# Patient Record
Sex: Female | Born: 1945 | Race: White | Hispanic: No | Smoking: Never smoker
Health system: Southern US, Community
[De-identification: ages and names within clinical notes are randomized; demographics above are authoritative.]

## PROBLEM LIST (undated history)

## (undated) DIAGNOSIS — D649 Anemia, unspecified: Secondary | ICD-10-CM

## (undated) DIAGNOSIS — C801 Malignant (primary) neoplasm, unspecified: Secondary | ICD-10-CM

## (undated) DIAGNOSIS — K449 Diaphragmatic hernia without obstruction or gangrene: Secondary | ICD-10-CM

## (undated) DIAGNOSIS — I10 Essential (primary) hypertension: Secondary | ICD-10-CM

## (undated) DIAGNOSIS — E079 Disorder of thyroid, unspecified: Secondary | ICD-10-CM

## (undated) DIAGNOSIS — J45909 Unspecified asthma, uncomplicated: Secondary | ICD-10-CM

## (undated) DIAGNOSIS — N289 Disorder of kidney and ureter, unspecified: Secondary | ICD-10-CM

## (undated) HISTORY — DX: Unspecified asthma, uncomplicated: J45.909

## (undated) HISTORY — DX: Anemia, unspecified: D64.9

## (undated) HISTORY — PX: TONSILLECTOMY: SUR1361

## (undated) HISTORY — PX: ABDOMINAL HYSTERECTOMY: SHX81

## (undated) HISTORY — PX: THYROIDECTOMY, PARTIAL: SHX18

## (undated) HISTORY — PX: COLOSTOMY: SHX63

---

## 1999-05-18 ENCOUNTER — Other Ambulatory Visit: Admission: RE | Admit: 1999-05-18 | Discharge: 1999-05-18 | Payer: Self-pay | Admitting: *Deleted

## 1999-05-23 ENCOUNTER — Emergency Department (HOSPITAL_COMMUNITY): Admission: EM | Admit: 1999-05-23 | Discharge: 1999-05-23 | Payer: Self-pay | Admitting: Emergency Medicine

## 1999-05-24 ENCOUNTER — Emergency Department (HOSPITAL_COMMUNITY): Admission: EM | Admit: 1999-05-24 | Discharge: 1999-05-24 | Payer: Self-pay | Admitting: Emergency Medicine

## 1999-05-24 ENCOUNTER — Encounter: Payer: Self-pay | Admitting: Endocrinology

## 1999-05-24 ENCOUNTER — Ambulatory Visit (HOSPITAL_COMMUNITY): Admission: RE | Admit: 1999-05-24 | Discharge: 1999-05-24 | Payer: Self-pay | Admitting: Endocrinology

## 1999-06-20 ENCOUNTER — Ambulatory Visit (HOSPITAL_COMMUNITY): Admission: RE | Admit: 1999-06-20 | Discharge: 1999-06-20 | Payer: Self-pay | Admitting: Endocrinology

## 1999-06-20 ENCOUNTER — Encounter: Payer: Self-pay | Admitting: Endocrinology

## 1999-07-18 ENCOUNTER — Other Ambulatory Visit: Admission: RE | Admit: 1999-07-18 | Discharge: 1999-07-18 | Payer: Self-pay | Admitting: Endocrinology

## 1999-07-27 ENCOUNTER — Encounter: Payer: Self-pay | Admitting: *Deleted

## 1999-07-27 ENCOUNTER — Ambulatory Visit (HOSPITAL_COMMUNITY): Admission: RE | Admit: 1999-07-27 | Discharge: 1999-07-27 | Payer: Self-pay | Admitting: *Deleted

## 1999-09-28 ENCOUNTER — Observation Stay (HOSPITAL_COMMUNITY): Admission: RE | Admit: 1999-09-28 | Discharge: 1999-09-29 | Payer: Self-pay

## 1999-09-28 ENCOUNTER — Encounter (INDEPENDENT_AMBULATORY_CARE_PROVIDER_SITE_OTHER): Payer: Self-pay | Admitting: Specialist

## 2000-06-05 ENCOUNTER — Ambulatory Visit (HOSPITAL_COMMUNITY): Admission: RE | Admit: 2000-06-05 | Discharge: 2000-06-05 | Payer: Self-pay | Admitting: Endocrinology

## 2000-06-05 ENCOUNTER — Encounter: Payer: Self-pay | Admitting: Endocrinology

## 2000-06-06 ENCOUNTER — Ambulatory Visit (HOSPITAL_COMMUNITY): Admission: RE | Admit: 2000-06-06 | Discharge: 2000-06-06 | Payer: Self-pay | Admitting: Endocrinology

## 2000-07-31 ENCOUNTER — Other Ambulatory Visit: Admission: RE | Admit: 2000-07-31 | Discharge: 2000-07-31 | Payer: Self-pay | Admitting: *Deleted

## 2001-07-22 ENCOUNTER — Encounter: Admission: RE | Admit: 2001-07-22 | Discharge: 2001-07-22 | Payer: Self-pay | Admitting: *Deleted

## 2001-07-22 ENCOUNTER — Encounter: Payer: Self-pay | Admitting: *Deleted

## 2002-07-29 ENCOUNTER — Encounter: Payer: Self-pay | Admitting: *Deleted

## 2002-07-29 ENCOUNTER — Encounter: Admission: RE | Admit: 2002-07-29 | Discharge: 2002-07-29 | Payer: Self-pay | Admitting: *Deleted

## 2003-08-18 ENCOUNTER — Encounter: Admission: RE | Admit: 2003-08-18 | Discharge: 2003-08-18 | Payer: Self-pay | Admitting: Obstetrics and Gynecology

## 2003-08-18 ENCOUNTER — Encounter: Payer: Self-pay | Admitting: Obstetrics and Gynecology

## 2005-09-03 ENCOUNTER — Encounter (INDEPENDENT_AMBULATORY_CARE_PROVIDER_SITE_OTHER): Payer: Self-pay | Admitting: *Deleted

## 2005-09-03 ENCOUNTER — Ambulatory Visit (HOSPITAL_COMMUNITY): Admission: RE | Admit: 2005-09-03 | Discharge: 2005-09-03 | Payer: Self-pay | Admitting: Obstetrics and Gynecology

## 2005-10-18 ENCOUNTER — Encounter: Admission: RE | Admit: 2005-10-18 | Discharge: 2005-10-18 | Payer: Self-pay | Admitting: Obstetrics and Gynecology

## 2006-01-31 ENCOUNTER — Ambulatory Visit (HOSPITAL_COMMUNITY): Admission: RE | Admit: 2006-01-31 | Discharge: 2006-01-31 | Payer: Self-pay | Admitting: Gastroenterology

## 2006-08-11 ENCOUNTER — Other Ambulatory Visit: Admission: RE | Admit: 2006-08-11 | Discharge: 2006-08-11 | Payer: Self-pay | Admitting: Obstetrics and Gynecology

## 2007-03-26 ENCOUNTER — Encounter: Admission: RE | Admit: 2007-03-26 | Discharge: 2007-03-26 | Payer: Self-pay | Admitting: Obstetrics and Gynecology

## 2008-03-31 ENCOUNTER — Encounter (INDEPENDENT_AMBULATORY_CARE_PROVIDER_SITE_OTHER): Payer: Self-pay | Admitting: Obstetrics and Gynecology

## 2008-03-31 ENCOUNTER — Ambulatory Visit (HOSPITAL_COMMUNITY): Admission: RE | Admit: 2008-03-31 | Discharge: 2008-04-01 | Payer: Self-pay | Admitting: Obstetrics and Gynecology

## 2008-09-21 ENCOUNTER — Encounter: Admission: RE | Admit: 2008-09-21 | Discharge: 2008-09-21 | Payer: Self-pay | Admitting: Obstetrics and Gynecology

## 2010-03-23 ENCOUNTER — Encounter: Admission: RE | Admit: 2010-03-23 | Discharge: 2010-03-23 | Payer: Self-pay | Admitting: Obstetrics and Gynecology

## 2011-03-12 NOTE — Op Note (Signed)
NAMEANBER, [name]               ACCOUNT NO.:  [id number]   MEDICAL RECORD NO.:  [id number]          PATIENT TYPE:  OIB   LOCATION:  9319                          FACILITY:  [facility]   PHYSICIAN:  Zenaida Niece, M.D.DATE OF BIRTH:  27-Jul-1946   DATE OF PROCEDURE:  03/31/2008  DATE OF DISCHARGE:                               OPERATIVE REPORT   PREOPERATIVE DIAGNOSIS:  Postmenopausal bleeding.   POSTOPERATIVE DIAGNOSIS:  Postmenopausal bleeding.   PROCEDURE:  Transvaginal hysterectomy and cystoscopy.   SURGEON:  Lavina Hamman, MD   ASSISTANT:  Sherron Monday, MD   ANESTHESIA:  Spinal.   FINDINGS:  She had a normal uterus with normal tubes and ovaries also.  By cystoscopy, the bladder was normal and she had patent ureters.   SPECIMENS:  Uterus with cervix sent to pathology.   ESTIMATED BLOOD LOSS:  100 mL.   COMPLICATIONS:  None.   PROCEDURE IN DETAIL:  The patient was taken to the operating room and  placed in the sitting position.  Dr. Tacy Dura instilled spinal anesthesia.  She was then placed in the dorsal supine position.  Legs were then  placed in mobile stirrups and elevated.  Perineum and vagina were then  prepped and draped in the usual sterile fashion and bladder drained with  a latex free catheter.  A weighted speculum was inserted into the vagina  and Deaver retractor was used anteriorly and laterally.  The cervix was  grasped with a Christella Hartigan tenaculum and the cervicovaginal mucosa was  infiltrated with a dilute solution of Pitressin and then incised  circumferentially with electrocautery.  Sharp dissection was then used  to further free the vagina from the cervix.  This was done anteriorly  and the vagina was pushed off but the anterior cul-de-sac was not  entered at this time.  The posterior cul-de-sac was identified and  entered sharply.  A Bonnano speculum was placed into the posterior cul-  de-sac.  Uterosacral ligaments were then clamped, transected and ligated  with #1 chromic and tagged for later use.  Bladder was pushed further  off anterior but anterior peritoneum was not yet entered.  Cardinal  ligaments and uterine arteries were clamped, transected and ligated on  each side with 1 chromic.  At this point, I was able to identify the  anterior peritoneum, entered sharply and the Deaver retractor was used  to retract the bladder anterior.  The remainder of the broad ligaments  were then clamped, transected and ligated with #1 chromic.  The  posterior uterus was grasped and delivered into the vagina.  Utero-  ovarian pedicles were clamped and transected and the uterus was removed.  Utero-ovarian pedicles were doubly ligated with #1 chromic.  All  pedicles were then inspected.  Bleeding from vaginal edges was  controlled with electrocautery.  The uterosacral ligaments were then  plicated in the midline with 2-0 silk.  The previously tagged  uterosacral pedicles were also tied in the midline.  Small amount of  bleeding from the vaginal cuff was noted.  The vagina was then closed in  a vertical fashion with  running locking 2-0 Vicryl with adequate closure  and adequate hemostasis.   Attention was turned to cystoscopy.  The patient had been given indigo  carmine IV.  A 70-degree cystoscope was inserted and 200 mL of sterile  solution was instilled.  The bladder appeared normal.  Both ureteral  orifices were easily identified and brisk flow of urine was seen to come  from each side.  The urine was blue tinted with the indigo carmine and  the bladder appeared intact.  The cystoscope was removed and a Foley  catheter was placed.  The patient was then taken down from stirrups.  She was then taken to the recovery room in stable condition after  tolerating the procedure well.  Counts were correct x2, she received  Ancef 1 g IV prior to the procedure and had PAS hose on throughout the  procedure.      Zenaida Niece, M.D.  Electronically  Signed     TDM/MEDQ  D:  03/31/2008  T:  04/01/2008  Job:  [id number]

## 2011-03-12 NOTE — H&P (Signed)
[name], [name]               ACCOUNT NO.:  [id number]   MEDICAL RECORD NO.:  [id number]          PATIENT TYPE:  AMB   LOCATION:  SDC                           FACILITY:  [facility]   PHYSICIAN:  Zenaida Niece, M.D.DATE OF BIRTH:  1946-01-26   DATE OF ADMISSION:  DATE OF DISCHARGE:                              HISTORY & PHYSICAL   CHIEF COMPLAINT:  Postmenopausal bleeding.   HISTORY OF PRESENT ILLNESS:  This is a 65 year old white female para 2-0-  0-2 whom I saw for an annual exam in February of this year.  At that  time she was having spotting approximately every 3 months and some  postcoital bleeding.  She had a saline infusion ultrasound  performed in  March of this year.  This revealed a normal sized uterus with slightly  thickened endometrium with two irregular lesions.  She also had a small  posterior fibroid which was approximately 20% submucosal.  Endometrial  biopsy was not done at that time as she had previously had an  endometrial biopsy hysteroscopy D and C performed in November of 2006  and that revealed some benign endometrial polyps.  Since she has  probable recurrent polyps she was offered conservative therapy with  repeat hysteroscopy, D and C or definitive surgery and the patient  wishes to proceed with definitive surgery.  She is being admitted for  this at this time.   PAST OB HISTORY:  Two vaginal deliveries at term without complications.   PAST MEDICAL HISTORY:  Hypothyroidism, hypertension, diverticulosis,  palpitations.   PAST SURGICAL HISTORY:  She had surgery for a thyroid adenoma and  tonsillectomy as well as hysteroscopy, D and C in November of 2006.   ALLERGIES:  SULFA, ZOFRAN, EPINEPHRINE, CODEINE, MACRODANTIN, MINOCIN,  CYCLINS.   CURRENT MEDICATIONS:  1. Prempro 0.4/1.5.  2. Norvasc.  3. Toprol.  4. Synthroid.  5. Baby aspirin.   SOCIAL HISTORY:  The patient is married and she denies alcohol, tobacco  or drug use.   FAMILY HISTORY:   No gyn or colon cancer.   REVIEW OF SYSTEMS:  She has normal bowel and bladder function and no  shortness of breath or chest pain.   PHYSICAL EXAMINATION:  VITAL SIGNS:  Weight is 217 pounds, blood  pressure in the office was 140/90.  GENERAL:  This is a well-developed white female in no acute distress.  NECK:  Neck is supple without lymphadenopathy or thyromegaly.  LUNGS:  Lungs are clear to auscultation.  HEART:  Regular rate and rhythm without murmur.  ABDOMEN:  Abdomen is soft, nontender, nondistended without palpable  masses.  EXTREMITIES:  Extremities have no edema and are nontender.  PELVIC:  External genitalia has no lesions.  On speculum exam the cervix  and vagina are normal.  On bimanual exam she has a small mid planar to  retroverted slightly irregular nontender uterus and no adnexal masses  and this is confirmed by rectovaginal exam.   ASSESSMENT:  Recurrent postmenopausal bleeding with probable recurrent  endometrial polyps.  All surgical options have been discussed with the  patient including repeat hysteroscopy,  D and C and resection.  The  patient wishes to undergo definitive surgical therapy.  All risks of  surgery have been discussed.  Plan is to admit the patient on the day of  surgery for vaginal hysterectomy and cystoscopy.      Zenaida Niece, M.D.  Electronically Signed     TDM/MEDQ  D:  03/30/2008  T:  03/30/2008  Job:  [id number]

## 2011-03-15 NOTE — H&P (Signed)
[name], [name]               ACCOUNT NO.:  [id number]   MEDICAL RECORD NO.:  [id number]          PATIENT TYPE:  AMB   LOCATION:  SDC                           FACILITY:  [facility]   PHYSICIAN:  Zenaida Niece, M.D.DATE OF BIRTH:  [birth year]   DATE OF ADMISSION:  09/03/2005  DATE OF DISCHARGE:                                HISTORY & PHYSICAL   CHIEF COMPLAINT:  Postmenopausal bleeding and probably endometrial polyp.   HISTORY OF PRESENT ILLNESS:  This is a 65 year old white female, para 2-0-0-  2, whom I first saw in June of this year.  At that time, she was having  postmenopausal bleeding on cyclic hormone therapy.  At that time, we changed  her to Prempro, and an endometrial biopsy was planned if her bleeding did  not improve.  She continued to have irregular bleeding, and on July 17, 2005 had an endometrial biopsy which returned with fragments of weakly  secretory endometrium with extensive breakdown and a small fragment  suggestive of an endometrial polyp.  She then had a pelvic ultrasound  performed on August 02, 2005, which revealed a normal uterus; however, she  did have a 13 mm endometrium with an obvious irregularity or lesion.  Her  right ovary was normal, and left ovary was not seen.  Due to her persistent  bleeding and an endometrial lesion seen by saline infusion ultrasound, we  are proceeding with operative therapy to remove this lesion.   PAST OBSTETRICAL HISTORY:  Two vaginal deliveries at term without  complications.   PAST MEDICAL HISTORY:  1.  Hypothyroidism.  2.  Palpitations.  3.  Foot edema.  4.  Diverticulosis.   PAST SURGICAL HISTORY:  1.  Surgery for a thyroid adenoma.  2.  Tonsillectomy.   CURRENT MEDICATIONS:  1.  Spironolactone.  2.  Prempro.  3.  Synthroid.  4.  Norvasc.  5.  Toprol.   ALLERGIES:  1.  MINOCIN.  2.  MACRODANTIN.  3.  SULFA.  4.  EPINEPHRINE.  5.  CODEINE.  6.  TETRACYCLINE.   PAST GYNECOLOGIC HISTORY:  No  abnormal Pap smears.  No sexually transmitted  diseases.   FAMILY HISTORY:  No GYN or colon cancer.   SOCIAL HISTORY:  The patient is married and denies alcohol, tobacco, or drug  use.   PHYSICAL EXAMINATION:  VITAL SIGNS:  Weight is approximately 219 pounds.  NECK:  Supple without lymphadenopathy or thyromegaly.  LUNGS:  Clear to auscultation.  HEART:  Regular rate and rhythm without murmur.  ABDOMEN:  Soft, nontender, nondistended, without palpable masses.  PELVIC:  External genitalia has no lesions.  On speculum examination, the  cervix is normal with a grade 1 cystocele and rectocele.  On bimanual exam,  she has a small mid plane to retroverted non-tender uterus with no adnexal  masses.   ASSESSMENT:  Postmenopausal bleeding with probable endometrial polyp.  Options have been discussed with the patient.  Risks of surgery including  bleeding, infection, and damage to surrounding organs including uterine  perforation have been discussed.  The patient understands these risks.  PLAN:  The plan is to admit the patient on the day of surgery for a  hysteroscopy with possible resection of a lesion and D&C.      Zenaida Niece, M.D.  Electronically Signed     TDM/MEDQ  D:  09/02/2005  T:  09/02/2005  Job:  [id number]

## 2011-03-15 NOTE — Op Note (Signed)
NAMECHAVONNE, [name]               ACCOUNT NO.:  [id number]   MEDICAL RECORD NO.:  [id number]          PATIENT TYPE:  AMB   LOCATION:  SDC                           [facility]ACILITY:  [facility]   PHYSICIAN:  Zenaida Niece, M.D.DATE OF BIRTH:  1946-07-17   DATE OF PROCEDURE:  09/03/2005  DATE OF DISCHARGE:                                 OPERATIVE REPORT   PREOPERATIVE DIAGNOSES:  1.  Postmenopausal bleeding.  2.  Possible endometrial polyp.   POSTOPERATIVE DIAGNOSES:  1.  Postmenopausal bleeding.  2.  Possible endometrial polyp.   PROCEDURES:  Hysteroscopy with dilation and curettage and resection of an  endometrial lesion.   SURGEON:  Zenaida Niece, M.D.   ANESTHESIA:  IV sedation and paracervical block.   SPECIMENS:  Endometrial curettings and endometrial resection.   ESTIMATED BLOOD LOSS:  Minimal.   COMPLICATIONS:  None.   Fluid deficit through the scope was approximately 100 mL.   FINDINGS:  She had a very fluffy and irregular endometrium.   PROCEDURE IN DETAIL:  The patient was taken to the operating room and placed  in the dorsal supine position.  She was given IV sedation and placed in  mobile stirrups.  The perineum and vagina were then prepped and draped in  the usual sterile fashion and bladder drained with a red rubber catheter.  A  Graves speculum was inserted into the vagina and the anterior lip of the  cervix was grasped with a single-tooth tenaculum.  Paracervical block then performed with 16 mL of 2% lidocaine.  The uterus  then sounded to between 9 and 10 cm.  The cervix was gradually dilated to a  size 23 dilator and the observer scope was introduced.  With the observer  scope a good visualization was achieved and there were noted to be multiple  polypoid lesions.  The hysteroscope was removed and curettage was performed  with return of a moderate amount of tissue.  The hysteroscope was reinserted  and there still appeared to be some polypoid lesions.  The  hysteroscope was  removed and the cervix was further dilated to a size 31 dilator and the  resectoscope was inserted.  Good visualization was achieved.  Several  polypoid lesions were removed with a single loop electrode using cutting  current.  The pieces were then removed through the scope.  Further  inspection revealed no further significant lesions.  The hysteroscope was  then removed.  Polyp forceps were used to remove any remaining pieces inside the uterus.  The single-tooth tenaculum was  removed from the cervix and bleeding controlled with pressure.  All  instruments were then removed from the vagina.  The patient was taken down  from stirrups.  She was taken to the recovery room in stable condition after  tolerating the procedure well.  Counts were correct x2.      Zenaida Niece, M.D.  Electronically Signed     TDM/MEDQ  D:  09/03/2005  T:  09/03/2005  Job:  [id number]

## 2011-07-25 LAB — CBC
Hemoglobin: 12.5
MCV: 92.1
Platelets: 285
RBC: 3.91
RDW: 13.1
WBC: 6.3

## 2011-07-25 LAB — BASIC METABOLIC PANEL
CO2: 29
Calcium: 8.8
GFR calc non Af Amer: >60
Sodium: 139

## 2011-08-12 ENCOUNTER — Other Ambulatory Visit: Payer: Self-pay | Admitting: Obstetrics and Gynecology

## 2011-08-12 DIAGNOSIS — Z1231 Encounter for screening mammogram for malignant neoplasm of breast: Secondary | ICD-10-CM

## 2011-08-30 ENCOUNTER — Ambulatory Visit
Admission: RE | Admit: 2011-08-30 | Discharge: 2011-08-30 | Disposition: A | Discharge status: Home / Self Care | Payer: Medicare Other | Source: Ambulatory Visit | Attending: Obstetrics and Gynecology | Admitting: Obstetrics and Gynecology

## 2011-08-30 DIAGNOSIS — Z1231 Encounter for screening mammogram for malignant neoplasm of breast: Secondary | ICD-10-CM

## 2011-09-03 ENCOUNTER — Other Ambulatory Visit: Payer: Self-pay | Admitting: Dermatology

## 2013-08-31 ENCOUNTER — Other Ambulatory Visit: Payer: Self-pay

## 2013-09-09 ENCOUNTER — Other Ambulatory Visit: Payer: Self-pay

## 2013-09-09 DIAGNOSIS — Z1231 Encounter for screening mammogram for malignant neoplasm of breast: Secondary | ICD-10-CM

## 2013-09-24 ENCOUNTER — Ambulatory Visit
Admission: RE | Admit: 2013-09-24 | Discharge: 2013-09-24 | Disposition: A | Discharge status: Home / Self Care | Payer: Medicare Other | Source: Ambulatory Visit

## 2013-09-24 DIAGNOSIS — Z1231 Encounter for screening mammogram for malignant neoplasm of breast: Secondary | ICD-10-CM

## 2013-11-21 ENCOUNTER — Observation Stay (HOSPITAL_COMMUNITY)
Admission: EM | Admit: 2013-11-21 | Discharge: 2013-11-23 | Disposition: A | Discharge status: Home / Self Care | Payer: Medicare Other | Attending: General Surgery | Admitting: General Surgery

## 2013-11-21 ENCOUNTER — Emergency Department (HOSPITAL_COMMUNITY): Payer: Medicare Other

## 2013-11-21 ENCOUNTER — Encounter (HOSPITAL_COMMUNITY): Payer: Self-pay | Admitting: Emergency Medicine

## 2013-11-21 DIAGNOSIS — E039 Hypothyroidism, unspecified: Secondary | ICD-10-CM | POA: Insufficient documentation

## 2013-11-21 DIAGNOSIS — K8000 Calculus of gallbladder with acute cholecystitis without obstruction: Principal | ICD-10-CM | POA: Insufficient documentation

## 2013-11-21 DIAGNOSIS — K810 Acute cholecystitis: Secondary | ICD-10-CM | POA: Diagnosis present

## 2013-11-21 DIAGNOSIS — K8020 Calculus of gallbladder without cholecystitis without obstruction: Secondary | ICD-10-CM

## 2013-11-21 DIAGNOSIS — I10 Essential (primary) hypertension: Secondary | ICD-10-CM | POA: Insufficient documentation

## 2013-11-21 HISTORY — DX: Disorder of thyroid, unspecified: E07.9

## 2013-11-21 HISTORY — DX: Essential (primary) hypertension: I10

## 2013-11-21 LAB — CBC WITH DIFFERENTIAL/PLATELET
BASOS ABS: 0 10*3/uL (ref 0.0–0.1)
Basophils Relative: 0 % (ref 0–1)
Eosinophils Absolute: 0 10*3/uL (ref 0.0–0.7)
Eosinophils Relative: 0 % (ref 0–5)
HEMATOCRIT: 38.4 % (ref 36.0–46.0)
HEMOGLOBIN: 13.2 g/dL (ref 12.0–15.0)
Lymphocytes Relative: 3 % — ABNORMAL LOW (ref 12–46)
Lymphs Abs: 0.3 10*3/uL — ABNORMAL LOW (ref 0.7–4.0)
MCH: 30.4 pg (ref 26.0–34.0)
MCHC: 34.4 g/dL (ref 30.0–36.0)
MCV: 88.5 fL (ref 78.0–100.0)
MONO ABS: 0.2 10*3/uL (ref 0.1–1.0)
MONOS PCT: 2 % — AB (ref 3–12)
NEUTROS ABS: 10.2 10*3/uL — AB (ref 1.7–7.7)
NEUTROS PCT: 94 % — AB (ref 43–77)
PLATELETS: 265 10*3/uL (ref 150–400)
RBC: 4.34 MIL/uL (ref 3.87–5.11)
RDW: 12.9 % (ref 11.5–15.5)
WBC: 10.8 10*3/uL — ABNORMAL HIGH (ref 4.0–10.5)

## 2013-11-21 LAB — URINALYSIS, ROUTINE W REFLEX MICROSCOPIC
BILIRUBIN URINE: NEGATIVE
GLUCOSE, UA: NEGATIVE mg/dL
Ketones, ur: NEGATIVE mg/dL
Nitrite: NEGATIVE
PROTEIN: NEGATIVE mg/dL
Specific Gravity, Urine: 1.022 (ref 1.005–1.030)
Urobilinogen, UA: 0.2 mg/dL (ref 0.0–1.0)
pH: 5 (ref 5.0–8.0)

## 2013-11-21 LAB — COMPREHENSIVE METABOLIC PANEL
ALBUMIN: 4.2 g/dL (ref 3.5–5.2)
ALT: 14 U/L (ref 0–35)
AST: 17 U/L (ref 0–37)
Alkaline Phosphatase: 67 U/L (ref 39–117)
BUN: 22 mg/dL (ref 6–23)
CALCIUM: 8.8 mg/dL (ref 8.4–10.5)
CHLORIDE: 103 meq/L (ref 96–112)
CO2: 25 mEq/L (ref 19–32)
Creatinine, Ser: 0.68 mg/dL (ref 0.50–1.10)
GFR, EST NON AFRICAN AMERICAN: 88 mL/min — AB (ref >90)
Glucose, Bld: 134 mg/dL — ABNORMAL HIGH (ref 70–99)
Potassium: 3.8 mEq/L (ref 3.7–5.3)
SODIUM: 142 meq/L (ref 137–147)
Total Bilirubin: 0.5 mg/dL (ref 0.3–1.2)
Total Protein: 7.4 g/dL (ref 6.0–8.3)

## 2013-11-21 LAB — URINE MICROSCOPIC-ADD ON

## 2013-11-21 LAB — LIPASE, BLOOD: LIPASE: 20 U/L (ref 11–59)

## 2013-11-21 MED ORDER — FENTANYL CITRATE 0.05 MG/ML IJ SOLN
25.0000 ug | Freq: Once | INTRAMUSCULAR | Status: AC
  Administered 2013-11-21: 25 ug via INTRAVENOUS
  Filled 2013-11-21: qty 2

## 2013-11-21 MED ORDER — SODIUM CHLORIDE 0.9 % IV BOLUS (SEPSIS)
1000.0000 mL | Freq: Once | INTRAVENOUS | Status: AC
  Administered 2013-11-21: 1000 mL via INTRAVENOUS

## 2013-11-21 MED ORDER — PROMETHAZINE HCL 25 MG/ML IJ SOLN
12.5000 mg | Freq: Four times a day (QID) | INTRAMUSCULAR | Status: DC | PRN
  Administered 2013-11-22 (×2): 12.5 mg via INTRAVENOUS
  Filled 2013-11-21 (×3): qty 1

## 2013-11-21 MED ORDER — HYDROMORPHONE HCL PF 1 MG/ML IJ SOLN: 1.0000 mg | INTRAMUSCULAR | Status: DC | PRN

## 2013-11-21 MED ORDER — PIPERACILLIN-TAZOBACTAM 3.375 G IVPB
3.3750 g | Freq: Four times a day (QID) | INTRAVENOUS | Status: DC
  Administered 2013-11-22 (×3): 3.375 g via INTRAVENOUS
  Filled 2013-11-21 (×8): qty 50

## 2013-11-21 MED ORDER — PROMETHAZINE HCL 25 MG/ML IJ SOLN
12.5000 mg | Freq: Once | INTRAMUSCULAR | Status: AC
  Administered 2013-11-21: 12.5 mg via INTRAVENOUS
  Filled 2013-11-21: qty 1

## 2013-11-21 MED ORDER — DEXTROSE-NACL 5-0.9 % IV SOLN
INTRAVENOUS | Status: DC
  Administered 2013-11-21: 20:00:00 via INTRAVENOUS

## 2013-11-21 NOTE — ED Notes (Signed)
Patient transported to Ultrasound 

## 2013-11-21 NOTE — ED Notes (Signed)
MD at bedside. 

## 2013-11-21 NOTE — ED Notes (Signed)
Pt knows that urine is needed. Pt is unable to void at this time.  

## 2013-11-21 NOTE — ED Provider Notes (Addendum)
CSN: [id number]     Arrival date & time 11/21/13  6606 History   First MD Initiated Contact with Patient 11/21/13 [phone]     Chief Complaint  Patient presents with  . Abdominal Pain  . Emesis   (Consider location/radiation/quality/duration/timing/severity/associated sxs/prior Treatment) Patient is a 68 y.o. female presenting with abdominal pain and vomiting. The history is provided by the patient.  Abdominal Pain Pain location:  Epigastric and RUQ Pain quality: aching, bloating and sharp   Pain radiates to:  Does not radiate Pain severity:  Moderate Onset quality:  Sudden Duration:  8 hours Timing:  Constant Progression:  Improving Chronicity:  Recurrent Context: awakening from sleep   Relieved by:  Nothing Worsened by:  Nothing tried Ineffective treatments:  Vomiting, belching and flatus Associated symptoms: anorexia, diarrhea, nausea and vomiting   Associated symptoms: no constipation, no cough, no fever and no shortness of breath   Risk factors: no alcohol abuse, no aspirin use, no NSAID use and no recent hospitalization   Emesis Associated symptoms: abdominal pain and diarrhea     Past Medical History  Diagnosis Date  . Thyroid disease   . Hypertension    Past Surgical History  Procedure Laterality Date  . Tonsillectomy    . Abdominal hysterectomy    . Thyroidectomy, partial     History reviewed. No pertinent family history. History  Substance Use Topics  . Smoking status: Never Smoker   . Smokeless tobacco: Not on file  . Alcohol Use: No   OB History   Grav Para Term Preterm Abortions TAB SAB Ect Mult Living                 Review of Systems  Constitutional: Negative for fever.  Respiratory: Negative for cough and shortness of breath.   Gastrointestinal: Positive for nausea, vomiting, abdominal pain, diarrhea and anorexia. Negative for constipation.  All other systems reviewed and are negative.    Allergies  Epinephrine; Codeine; Demerol; Macrodantin;  Micardis; Protonix; Sulfa antibiotics; and Zofran  Home Medications   Current Outpatient Rx  Name  Route  Sig  Dispense  Refill  . amLODipine (NORVASC) 5 MG tablet   Oral   Take 5 mg by mouth daily.         Marland Kitchen esomeprazole (NEXIUM) 20 MG capsule   Oral   Take 20 mg by mouth as needed (for heartburn).         Marland Kitchen levothyroxine (SYNTHROID, LEVOTHROID) 75 MCG tablet   Oral   Take 75 mcg by mouth daily before breakfast.         . metoprolol succinate (TOPROL-XL) 50 MG 24 hr tablet   Oral   Take 50 mg by mouth daily. Take with or immediately following a meal.          BP 153/70  Pulse 94  Temp(Src) 98.2 F (36.8 C) (Oral)  Resp 16  SpO2 97% Physical Exam  Nursing note and vitals reviewed. Constitutional: She is oriented to person, place, and time. She appears well-developed and well-nourished. No distress.  HENT:  Head: Normocephalic and atraumatic.  Mouth/Throat: Oropharynx is clear and moist. Mucous membranes are dry.  Eyes: Conjunctivae and EOM are normal. Pupils are equal, round, and reactive to light.  Neck: Normal range of motion. Neck supple.  Cardiovascular: Normal rate, regular rhythm and intact distal pulses.   No murmur heard. Pulmonary/Chest: Effort normal and breath sounds normal. No respiratory distress. She has no wheezes. She has no rales.  Abdominal:  Soft. She exhibits no distension. There is tenderness in the right upper quadrant and epigastric area. There is positive Murphy's sign. There is no rebound and no guarding.  Musculoskeletal: Normal range of motion. She exhibits no edema and no tenderness.  Neurological: She is alert and oriented to person, place, and time.  Skin: Skin is warm and dry. No rash noted. No erythema.  Psychiatric: She has a normal mood and affect. Her behavior is normal.    ED Course  Procedures (including critical care time) Labs Review Labs Reviewed  COMPREHENSIVE METABOLIC PANEL - Abnormal; Notable for the following:     Glucose, Bld 134 (*)    GFR calc non Af Amer 88 (*)    All other components within normal limits  CBC WITH DIFFERENTIAL - Abnormal; Notable for the following:    WBC 10.8 (*)    Neutrophils Relative % 94 (*)    Neutro Abs 10.2 (*)    Lymphocytes Relative 3 (*)    Lymphs Abs 0.3 (*)    Monocytes Relative 2 (*)    All other components within normal limits  URINALYSIS, ROUTINE W REFLEX MICROSCOPIC - Abnormal; Notable for the following:    Hgb urine dipstick MODERATE (*)    Leukocytes, UA SMALL (*)    All other components within normal limits  URINE MICROSCOPIC-ADD ON - Abnormal; Notable for the following:    Squamous Epithelial / LPF FEW (*)    All other components within normal limits  LIPASE, BLOOD   Imaging Review US Abdomen Complete  11/21/2013   CLINICAL DATA:  Right upper quadrant pain.  EXAM: ULTRASOUND ABDOMEN COMPLETE  COMPARISON:  Renal ultrasound 09/08/2008. Abdominal ultrasound 01/31/2006. CT 05/08/2004.  FINDINGS: Gallbladder:  Gallstones. Gallbladder wall thickness 2.7 mm. Negative Murphy sign. No pericholecystic fluid collection.  Common bile duct:  Diameter: 4.5 mm.  Liver:  Coarse dense echotexture. This suggests hepatocellular disease. Fatty infiltration cannot be excluded.  IVC:  No abnormality visualized.  Pancreas:  Echogenic. This could be seen with chronic pancreatitis and fatty infiltration.  Spleen:  Size and appearance within normal limits.  Right Kidney:  Length: 12.3 cm. Lower pole 2.6 x 2.5 x 2.2 cm simple cyst. Parapelvic cysts. Cortical thinning consistent with chronic renal disease. No hydronephrosis.  Left Kidney:  Length: 13.3 cm. Cortical thinning consistent with chronic renal disease. Parapelvic cysts. No hydronephrosis.  Abdominal aorta:  No aneurysm visualized.  Other findings:  None.  IMPRESSION: 1. Gallstones. Gallbladder wall thickness normal. Negative Murphy sign. No pericholecystic fluid collection . 2. Liver has a coarse dense echotexture. This suggests  hepatocellular disease and/or fatty infiltration. 3. Echogenic pancreas. This could be seen with chronic pancreatitis and/or fatty infiltration. 4. Bilateral renal parapelvic cysts. Simple cyst noted on lower pole right kidney. No hydronephrosis.   Electronically Signed   By: Marcello Moores  Register   On: 11/21/2013 11:23    EKG Interpretation    Date/Time:  Sunday November 21 2013 09:17:24 EST Ventricular Rate:  88 PR Interval:  162 QRS Duration: 87 QT Interval:  397 QTC Calculation: 480 R Axis:   32 Text Interpretation:  Sinus rhythm Consider left atrial enlargement Low voltage, precordial leads Borderline T abnormalities, anterior leads Borderline prolonged QT interval Baseline wander in lead(s) V5 No significant change since last tracing Confirmed by Kenndra Morris  MD, Persais Ethridge ([id number]) on 11/21/2013 10:06:56 AM            MDM   1. Cholelithiasis     Patient with history of  upper abdominal pain, right upper quadrant pain, nausea, vomiting and some loose stools that started approximately 2 AM today. Currently patient still having a dull pain but no nausea at this time. On exam patient has a positive Murphy sign and right upper quadrant tenderness concerning for cholecystitis.  No left upper quadrant pain her symptoms concerning for pancreatitis, appendicitis or diverticulitis. Also the possibility of GERD as patient does take Nexium daily for reflux. Low suspicion for obstruction or perforation. EKG is within normal limits and low suspicion for cardiac etiology.  CBC, CMP, lipase, abdominal ultrasound pending for further evaluation. Patient given pain, nausea control and IV fluids. Otherwise hemodynamically stable  1:02 PM Labs unremarkable. Patient feels much better after IV pain and nausea medicine. Ultrasound showed gallstones without cholecystitis at this time.   2:28 PM When attempting to po challenge the pt she develops repeat nausea and RUQ pain.  Will discuss with surgery. Blanchie Dessert, MD 11/21/13 [place], MD 11/21/13 [place], MD 11/21/13 1521

## 2013-11-21 NOTE — H&P (Signed)
[name] is an 68 y.o. female.   Chief Complaint: abdominal pain HPI: the patient is a 68 year old female who presents with a 24 history of abdominal pain. She states the pain is mainly in the right upper quadrant. She states she's had some the symptoms are not in the past. She states that this is the worst ever been. Patient was given pain medication in the emergency room however this was short-lived, and the pain returned after inducing crackers. Patient underwent an ultrasound which revealed gallstones, but no signs of cholecystitis.  The patient's had some nausea and vomiting at home which he statesMinimally helped  With the pain.  Past Medical History  Diagnosis Date  . Thyroid disease   . Hypertension     Past Surgical History  Procedure Laterality Date  . Tonsillectomy    . Abdominal hysterectomy    . Thyroidectomy, partial      History reviewed. No pertinent family history. Social History:  reports that she has never smoked. She does not have any smokeless tobacco history on file. She reports that she does not drink alcohol. Her drug history is not on file.  Allergies:  Allergies  Allergen Reactions  . Epinephrine Anaphylaxis  . Codeine Other (See Comments)    "ill"  . Demerol [Meperidine] Other (See Comments)    "ill"  . Macrodantin [Nitrofurantoin Macrocrystal] Other (See Comments)    "made her feel reclusive"  . Micardis [Telmisartan] Other (See Comments)    "didn't move for 6 hours"  . Protonix [Pantoprazole Sodium] Other (See Comments)    "ill"  . Sulfa Antibiotics Other (See Comments)    "blind spell"  . Zofran [Ondansetron Hcl] Other (See Comments)    "ill"     (Not in a hospital admission)  Results for orders placed during the hospital encounter of 11/21/13 (from the past 48 hour(s))  COMPREHENSIVE METABOLIC PANEL     Status: Abnormal   Collection Time    11/21/13  9:10 AM      Result Value Range   Sodium 142  137 - 147 mEq/L   Potassium 3.8   3.7 - 5.3 mEq/L   Chloride 103  96 - 112 mEq/L   CO2 25  19 - 32 mEq/L   Glucose, Bld 134 (*) 70 - 99 mg/dL   BUN 22  6 - 23 mg/dL   Creatinine, Ser 0.68  0.50 - 1.10 mg/dL   Calcium 8.8  8.4 - 10.5 mg/dL   Total Protein 7.4  6.0 - 8.3 g/dL   Albumin 4.2  3.5 - 5.2 g/dL   AST 17  0 - 37 U/L   ALT 14  0 - 35 U/L   Alkaline Phosphatase 67  39 - 117 U/L   Total Bilirubin 0.5  0.3 - 1.2 mg/dL   GFR calc non Af Amer 88 (*) >90 mL/min   GFR calc Af Amer >90  >90 mL/min   Comment: (NOTE)     The eGFR has been calculated using the CKD EPI equation.     This calculation has not been validated in all clinical situations.     eGFR's persistently <90 mL/min signify possible Chronic Kidney     Disease.  CBC WITH DIFFERENTIAL     Status: Abnormal   Collection Time    11/21/13  9:10 AM      Result Value Range   WBC 10.8 (*) 4.0 - 10.5 K/uL   RBC 4.34  3.87 - 5.11 MIL/uL  Hemoglobin 13.2  12.0 - 15.0 g/dL   HCT 38.4  36.0 - 46.0 %   MCV 88.5  78.0 - 100.0 fL   MCH 30.4  26.0 - 34.0 pg   MCHC 34.4  30.0 - 36.0 g/dL   RDW 12.9  11.5 - 15.5 %   Platelets 265  150 - 400 K/uL   Neutrophils Relative % 94 (*) 43 - 77 %   Neutro Abs 10.2 (*) 1.7 - 7.7 K/uL   Lymphocytes Relative 3 (*) 12 - 46 %   Lymphs Abs 0.3 (*) 0.7 - 4.0 K/uL   Monocytes Relative 2 (*) 3 - 12 %   Monocytes Absolute 0.2  0.1 - 1.0 K/uL   Eosinophils Relative 0  0 - 5 %   Eosinophils Absolute 0.0  0.0 - 0.7 K/uL   Basophils Relative 0  0 - 1 %   Basophils Absolute 0.0  0.0 - 0.1 K/uL  LIPASE, BLOOD     Status: None   Collection Time    11/21/13  9:10 AM      Result Value Range   Lipase 20  11 - 59 U/L  URINALYSIS, ROUTINE W REFLEX MICROSCOPIC     Status: Abnormal   Collection Time    11/21/13 11:47 AM      Result Value Range   Color, Urine YELLOW  YELLOW   APPearance CLEAR  CLEAR   Specific Gravity, Urine 1.022  1.005 - 1.030   pH 5.0  5.0 - 8.0   Glucose, UA NEGATIVE  NEGATIVE mg/dL   Hgb urine dipstick MODERATE  (*) NEGATIVE   Bilirubin Urine NEGATIVE  NEGATIVE   Ketones, ur NEGATIVE  NEGATIVE mg/dL   Protein, ur NEGATIVE  NEGATIVE mg/dL   Urobilinogen, UA 0.2  0.0 - 1.0 mg/dL   Nitrite NEGATIVE  NEGATIVE   Leukocytes, UA SMALL (*) NEGATIVE  URINE MICROSCOPIC-ADD ON     Status: Abnormal   Collection Time    11/21/13 11:47 AM      Result Value Range   Squamous Epithelial / LPF FEW (*) RARE   WBC, UA 3-6  <3 WBC/hpf   RBC / HPF 3-6  <3 RBC/hpf   Bacteria, UA RARE  RARE   US Abdomen Complete  11/21/2013   CLINICAL DATA:  Right upper quadrant pain.  EXAM: ULTRASOUND ABDOMEN COMPLETE  COMPARISON:  Renal ultrasound 09/08/2008. Abdominal ultrasound 01/31/2006. CT 05/08/2004.  FINDINGS: Gallbladder:  Gallstones. Gallbladder wall thickness 2.7 mm. Negative Murphy sign. No pericholecystic fluid collection.  Common bile duct:  Diameter: 4.5 mm.  Liver:  Coarse dense echotexture. This suggests hepatocellular disease. Fatty infiltration cannot be excluded.  IVC:  No abnormality visualized.  Pancreas:  Echogenic. This could be seen with chronic pancreatitis and fatty infiltration.  Spleen:  Size and appearance within normal limits.  Right Kidney:  Length: 12.3 cm. Lower pole 2.6 x 2.5 x 2.2 cm simple cyst. Parapelvic cysts. Cortical thinning consistent with chronic renal disease. No hydronephrosis.  Left Kidney:  Length: 13.3 cm. Cortical thinning consistent with chronic renal disease. Parapelvic cysts. No hydronephrosis.  Abdominal aorta:  No aneurysm visualized.  Other findings:  None.  IMPRESSION: 1. Gallstones. Gallbladder wall thickness normal. Negative Murphy sign. No pericholecystic fluid collection . 2. Liver has a coarse dense echotexture. This suggests hepatocellular disease and/or fatty infiltration. 3. Echogenic pancreas. This could be seen with chronic pancreatitis and/or fatty infiltration. 4. Bilateral renal parapelvic cysts. Simple cyst noted on lower pole right kidney. No  hydronephrosis.   Electronically  Signed   By: Marcello Moores  Register   On: 11/21/2013 11:23    Review of Systems  Constitutional: Negative for weight loss.  HENT: Negative for ear discharge, ear pain, hearing loss and tinnitus.   Eyes: Negative for blurred vision, double vision, photophobia and pain.  Respiratory: Negative for cough, sputum production and shortness of breath.   Cardiovascular: Negative for chest pain.  Gastrointestinal: Positive for nausea, vomiting and abdominal pain. Negative for heartburn and diarrhea.  Genitourinary: Negative for dysuria, urgency, frequency and flank pain.  Musculoskeletal: Negative for back pain, falls, joint pain, myalgias and neck pain.  Neurological: Negative for dizziness, tingling, sensory change, focal weakness, loss of consciousness and headaches.  Endo/Heme/Allergies: Does not bruise/bleed easily.  Psychiatric/Behavioral: Negative for depression, memory loss and substance abuse. The patient is not nervous/anxious.     Blood pressure 126/56, pulse 94, temperature 98.2 F (36.8 C), temperature source Oral, resp. rate 18, SpO2 97.00%. Physical Exam  Vitals reviewed. Constitutional: She is oriented to person, place, and time. She appears well-developed and well-nourished. She is cooperative. No distress. Cervical collar and nasal cannula in place.  HENT:  Head: Normocephalic and atraumatic. Head is without raccoon's eyes, without Battle's sign, without abrasion, without contusion and without laceration.  Right Ear: Hearing, tympanic membrane, external ear and ear canal normal. No lacerations. No drainage or tenderness. No foreign bodies. Tympanic membrane is not perforated. No hemotympanum.  Left Ear: Hearing, tympanic membrane, external ear and ear canal normal. No lacerations. No drainage or tenderness. No foreign bodies. Tympanic membrane is not perforated. No hemotympanum.  Nose: Nose normal. No nose lacerations, sinus tenderness, nasal deformity or nasal septal hematoma. No  epistaxis.  Mouth/Throat: Uvula is midline, oropharynx is clear and moist and mucous membranes are normal. No lacerations.  Eyes: Conjunctivae, EOM and lids are normal. Pupils are equal, round, and reactive to light. No scleral icterus.  Neck: Trachea normal. No JVD present. No spinous process tenderness and no muscular tenderness present. Carotid bruit is not present. No thyromegaly present.  Cardiovascular: Normal rate, regular rhythm, normal heart sounds, intact distal pulses and normal pulses.   Respiratory: Effort normal and breath sounds normal. No respiratory distress. She exhibits no tenderness, no bony tenderness, no laceration and no crepitus.  GI: Soft. Normal appearance. She exhibits no distension. Bowel sounds are decreased. There is tenderness (RUQ). There is no rigidity, no rebound, no guarding and no CVA tenderness.  Musculoskeletal: Normal range of motion. She exhibits no edema and no tenderness.  Lymphadenopathy:    She has no cervical adenopathy.  Neurological: She is alert and oriented to person, place, and time. She has normal strength. No cranial nerve deficit or sensory deficit. GCS eye subscore is 4. GCS verbal subscore is 5. GCS motor subscore is 6.  Skin: Skin is warm, dry and intact. She is not diaphoretic.  Psychiatric: She has a normal mood and affect. Her speech is normal and behavior is normal.     Assessment/Plan 68 year old female with symptomatic cholelithiasis  1. Will admit the patient perforated operating room. The patient will likely need a lap cholecystectomy with intraoperative cholangiogram. All discussed this patient with Dr. Hulen Skains will be our Dr. of the week. 2. Patient remained n.p.o., IV fluids, pain medication  Rosario Jacks., Anne Hahn 11/21/2013, 3:50 PM

## 2013-11-21 NOTE — ED Notes (Addendum)
Pt ambulate to restroom. Pt states she feels sore in RUQ but denies pain

## 2013-11-21 NOTE — ED Notes (Signed)
Pt from home, c/o abd pressure and belching starting at 2 am. Pt states episodes of emesis as well. No abd tenderness.

## 2013-11-22 ENCOUNTER — Encounter (HOSPITAL_COMMUNITY): Payer: Self-pay | Admitting: Anesthesiology

## 2013-11-22 ENCOUNTER — Encounter (HOSPITAL_COMMUNITY): Payer: Medicare Other | Admitting: Anesthesiology

## 2013-11-22 ENCOUNTER — Encounter (HOSPITAL_COMMUNITY)
Admission: EM | Disposition: A | Discharge status: Home / Self Care | Payer: Self-pay | Source: Home / Self Care | Attending: Emergency Medicine

## 2013-11-22 ENCOUNTER — Observation Stay (HOSPITAL_COMMUNITY): Payer: Medicare Other | Admitting: Anesthesiology

## 2013-11-22 DIAGNOSIS — K8010 Calculus of gallbladder with chronic cholecystitis without obstruction: Secondary | ICD-10-CM

## 2013-11-22 HISTORY — PX: CHOLECYSTECTOMY: SHX55

## 2013-11-22 SURGERY — LAPAROSCOPIC CHOLECYSTECTOMY WITH INTRAOPERATIVE CHOLANGIOGRAM
Anesthesia: General | Site: Abdomen

## 2013-11-22 MED ORDER — TRAMADOL HCL 50 MG PO TABS: 50.0000 mg | ORAL_TABLET | Freq: Four times a day (QID) | ORAL | Status: DC | PRN

## 2013-11-22 MED ORDER — LIDOCAINE HCL (CARDIAC) 20 MG/ML IV SOLN
INTRAVENOUS | Status: DC | PRN
  Administered 2013-11-22: 100 mg via INTRAVENOUS

## 2013-11-22 MED ORDER — DEXTROSE-NACL 5-0.9 % IV SOLN
INTRAVENOUS | Status: DC
  Administered 2013-11-22 – 2013-11-23 (×2): via INTRAVENOUS

## 2013-11-22 MED ORDER — PROPOFOL 10 MG/ML IV BOLUS
INTRAVENOUS | Status: AC
  Filled 2013-11-22: qty 20

## 2013-11-22 MED ORDER — OXYCODONE-ACETAMINOPHEN 5-325 MG PO TABS
1.0000 | ORAL_TABLET | ORAL | Status: DC | PRN
  Administered 2013-11-23: 1 via ORAL
  Filled 2013-11-22: qty 1

## 2013-11-22 MED ORDER — BUPIVACAINE HCL (PF) 0.25 % IJ SOLN
INTRAMUSCULAR | Status: AC
  Filled 2013-11-22: qty 30

## 2013-11-22 MED ORDER — HYDROMORPHONE HCL PF 1 MG/ML IJ SOLN
0.5000 mg | INTRAMUSCULAR | Status: DC | PRN
  Administered 2013-11-22: 1 mg via INTRAVENOUS
  Filled 2013-11-22: qty 1

## 2013-11-22 MED ORDER — 0.9 % SODIUM CHLORIDE (POUR BTL) OPTIME
TOPICAL | Status: DC | PRN
  Administered 2013-11-22: 1000 mL

## 2013-11-22 MED ORDER — SUCCINYLCHOLINE CHLORIDE 20 MG/ML IJ SOLN
INTRAMUSCULAR | Status: DC | PRN
  Administered 2013-11-22: 100 mg via INTRAVENOUS

## 2013-11-22 MED ORDER — LEVOTHYROXINE SODIUM 75 MCG PO TABS
75.0000 ug | ORAL_TABLET | Freq: Every day | ORAL | Status: DC
  Administered 2013-11-23: 75 ug via ORAL
  Filled 2013-11-22 (×2): qty 1

## 2013-11-22 MED ORDER — FENTANYL CITRATE 0.05 MG/ML IJ SOLN
INTRAMUSCULAR | Status: DC | PRN
  Administered 2013-11-22: 50 ug via INTRAVENOUS
  Administered 2013-11-22: 125 ug via INTRAVENOUS
  Administered 2013-11-22: 25 ug via INTRAVENOUS

## 2013-11-22 MED ORDER — BUPIVACAINE-EPINEPHRINE (PF) 0.25% -1:200000 IJ SOLN
INTRAMUSCULAR | Status: AC
  Filled 2013-11-22: qty 30

## 2013-11-22 MED ORDER — HYDROMORPHONE HCL PF 1 MG/ML IJ SOLN
0.2500 mg | INTRAMUSCULAR | Status: DC | PRN
Start: 2013-11-22 — End: 2013-11-22
  Administered 2013-11-22 (×2): 0.5 mg via INTRAVENOUS

## 2013-11-22 MED ORDER — LIDOCAINE HCL (CARDIAC) 20 MG/ML IV SOLN
INTRAVENOUS | Status: AC
  Filled 2013-11-22: qty 5

## 2013-11-22 MED ORDER — PROPRANOLOL HCL 1 MG/ML IV SOLN
INTRAVENOUS | Status: AC
  Filled 2013-11-22: qty 1

## 2013-11-22 MED ORDER — ONDANSETRON HCL 4 MG/2ML IJ SOLN
INTRAMUSCULAR | Status: AC
  Filled 2013-11-22: qty 2

## 2013-11-22 MED ORDER — AMLODIPINE BESYLATE 5 MG PO TABS
5.0000 mg | ORAL_TABLET | Freq: Every day | ORAL | Status: DC
  Administered 2013-11-22 – 2013-11-23 (×2): 5 mg via ORAL
  Filled 2013-11-22 (×3): qty 1

## 2013-11-22 MED ORDER — HYDROMORPHONE HCL PF 1 MG/ML IJ SOLN
INTRAMUSCULAR | Status: AC
  Filled 2013-11-22: qty 1

## 2013-11-22 MED ORDER — PROPOFOL 10 MG/ML IV BOLUS
INTRAVENOUS | Status: DC | PRN
  Administered 2013-11-22: 200 mg via INTRAVENOUS

## 2013-11-22 MED ORDER — PROPRANOLOL HCL 1 MG/ML IV SOLN
INTRAVENOUS | Status: DC | PRN
  Administered 2013-11-22: .25 mg via INTRAVENOUS

## 2013-11-22 MED ORDER — MIDAZOLAM HCL 2 MG/2ML IJ SOLN
INTRAMUSCULAR | Status: AC
  Filled 2013-11-22: qty 2

## 2013-11-22 MED ORDER — ROCURONIUM BROMIDE 100 MG/10ML IV SOLN
INTRAVENOUS | Status: DC | PRN
  Administered 2013-11-22: 30 mg via INTRAVENOUS
  Administered 2013-11-22: 10 mg via INTRAVENOUS

## 2013-11-22 MED ORDER — GLYCOPYRROLATE 0.2 MG/ML IJ SOLN
INTRAMUSCULAR | Status: AC
  Filled 2013-11-22: qty 3

## 2013-11-22 MED ORDER — FENTANYL CITRATE 0.05 MG/ML IJ SOLN
INTRAMUSCULAR | Status: AC
  Filled 2013-11-22: qty 5

## 2013-11-22 MED ORDER — NEOSTIGMINE METHYLSULFATE 1 MG/ML IJ SOLN
INTRAMUSCULAR | Status: DC | PRN
  Administered 2013-11-22: 3 mg via INTRAVENOUS

## 2013-11-22 MED ORDER — MIDAZOLAM HCL 5 MG/5ML IJ SOLN
INTRAMUSCULAR | Status: DC | PRN
  Administered 2013-11-22: 2 mg via INTRAVENOUS

## 2013-11-22 MED ORDER — SODIUM CHLORIDE 0.9 % IR SOLN
Status: DC | PRN
  Administered 2013-11-22 (×2): 1000 mL

## 2013-11-22 MED ORDER — GLYCOPYRROLATE 0.2 MG/ML IJ SOLN
INTRAMUSCULAR | Status: AC
  Filled 2013-11-22: qty 2

## 2013-11-22 MED ORDER — ROCURONIUM BROMIDE 50 MG/5ML IV SOLN
INTRAVENOUS | Status: AC
  Filled 2013-11-22: qty 1

## 2013-11-22 MED ORDER — ONDANSETRON HCL 4 MG/2ML IJ SOLN
4.0000 mg | Freq: Once | INTRAMUSCULAR | Status: DC | PRN
Start: 2013-11-22 — End: 2013-11-22

## 2013-11-22 MED ORDER — GLYCOPYRROLATE 0.2 MG/ML IJ SOLN
INTRAMUSCULAR | Status: DC | PRN
  Administered 2013-11-22: 0.2 mg via INTRAVENOUS
  Administered 2013-11-22: 0.4 mg via INTRAVENOUS
  Administered 2013-11-22: 0.2 mg via INTRAVENOUS

## 2013-11-22 MED ORDER — METOPROLOL SUCCINATE ER 50 MG PO TB24
50.0000 mg | ORAL_TABLET | Freq: Every day | ORAL | Status: DC
  Administered 2013-11-22 – 2013-11-23 (×2): 50 mg via ORAL
  Filled 2013-11-22 (×3): qty 1

## 2013-11-22 MED ORDER — LACTATED RINGERS IV SOLN
INTRAVENOUS | Status: DC | PRN
  Administered 2013-11-22 (×2): via INTRAVENOUS

## 2013-11-22 SURGICAL SUPPLY — 42 items
ADH SKN CLS APL DERMABOND .7 (GAUZE/BANDAGES/DRESSINGS) ×1
BAG SPEC RTRVL LRG 6X4 10 (ENDOMECHANICALS) ×1
CANISTER SUCTION 2500CC (MISCELLANEOUS) ×3 IMPLANT
CHLORAPREP W/TINT 26ML (MISCELLANEOUS) ×3 IMPLANT
CLOSURE STERI-STRIP 1/2X4 (GAUZE/BANDAGES/DRESSINGS) ×1
CLSR STERI-STRIP ANTIMIC 1/2X4 (GAUZE/BANDAGES/DRESSINGS) ×1 IMPLANT
COVER MAYO STAND STRL (DRAPES) ×3 IMPLANT
COVER SURGICAL LIGHT HANDLE (MISCELLANEOUS) ×3 IMPLANT
DERMABOND ADVANCED (GAUZE/BANDAGES/DRESSINGS) ×2
DERMABOND ADVANCED .7 DNX12 (GAUZE/BANDAGES/DRESSINGS) ×1 IMPLANT
DRAPE UTILITY 15X26 W/TAPE STR (DRAPE) ×6 IMPLANT
DRSG TEGADERM 2-3/8X2-3/4 SM (GAUZE/BANDAGES/DRESSINGS) ×8 IMPLANT
ELECT REM PT RETURN 9FT ADLT (ELECTROSURGICAL) ×3
ELECTRODE REM PT RTRN 9FT ADLT (ELECTROSURGICAL) ×1 IMPLANT
GLOVE BIOGEL PI IND STRL 6.5 (GLOVE) IMPLANT
GLOVE BIOGEL PI IND STRL 7.0 (GLOVE) IMPLANT
GLOVE BIOGEL PI IND STRL 8 (GLOVE) ×1 IMPLANT
GLOVE BIOGEL PI INDICATOR 6.5 (GLOVE) ×2
GLOVE BIOGEL PI INDICATOR 7.0 (GLOVE) ×2
GLOVE BIOGEL PI INDICATOR 8 (GLOVE) ×2
GLOVE ECLIPSE 7.5 STRL STRAW (GLOVE) ×3 IMPLANT
GLOVE SURG SS PI 7.0 STRL IVOR (GLOVE) ×2 IMPLANT
GOWN STRL NON-REIN LRG LVL3 (GOWN DISPOSABLE) ×9 IMPLANT
KIT BASIN OR (CUSTOM PROCEDURE TRAY) ×3 IMPLANT
KIT ROOM TURNOVER OR (KITS) ×3 IMPLANT
NDL HYPO 25GX1X1/2 BEV (NEEDLE) IMPLANT
NEEDLE HYPO 25GX1X1/2 BEV (NEEDLE) ×3 IMPLANT
NS IRRIG 1000ML POUR BTL (IV SOLUTION) ×3 IMPLANT
PAD ARMBOARD 7.5X6 YLW CONV (MISCELLANEOUS) ×4 IMPLANT
POUCH SPECIMEN RETRIEVAL 10MM (ENDOMECHANICALS) ×3 IMPLANT
SCISSORS LAP 5X35 DISP (ENDOMECHANICALS) ×3 IMPLANT
SET CHOLANGIOGRAPH 5 50 .035 (SET/KITS/TRAYS/PACK) ×1 IMPLANT
SET IRRIG TUBING LAPAROSCOPIC (IRRIGATION / IRRIGATOR) ×3 IMPLANT
SLEEVE ENDOPATH XCEL 5M (ENDOMECHANICALS) ×3 IMPLANT
SPECIMEN JAR SMALL (MISCELLANEOUS) ×3 IMPLANT
SUT MNCRL AB 4-0 PS2 18 (SUTURE) ×5 IMPLANT
SYR CONTROL 10ML LL (SYRINGE) ×2 IMPLANT
TOWEL OR 17X24 6PK STRL BLUE (TOWEL DISPOSABLE) ×3 IMPLANT
TOWEL OR 17X26 10 PK STRL BLUE (TOWEL DISPOSABLE) ×3 IMPLANT
TRAY LAPAROSCOPIC (CUSTOM PROCEDURE TRAY) ×3 IMPLANT
TROCAR XCEL BLUNT TIP 100MML (ENDOMECHANICALS) ×3 IMPLANT
TROCAR XCEL NON-BLD 5MMX100MML (ENDOMECHANICALS) ×3 IMPLANT

## 2013-11-22 NOTE — Transfer of Care (Signed)
Immediate Anesthesia Transfer of Care Note  Patient: [name]  Procedure(s) Performed: Procedure(s): LAPAROSCOPIC CHOLECYSTECTOMY WITH INTRAOPERATIVE CHOLANGIOGRAM (N/A)  Patient Location: PACU  Anesthesia Type:General  Level of Consciousness: awake, alert  and oriented  Airway & Oxygen Therapy: Patient Spontanous Breathing and Patient connected to nasal cannula oxygen  Post-op Assessment: Report given to PACU RN, Post -op Vital signs reviewed and stable and Patient moving all extremities X 4  Post vital signs: Reviewed and stable  Complications: No apparent anesthesia complications

## 2013-11-22 NOTE — Progress Notes (Signed)
UR completed 

## 2013-11-22 NOTE — Progress Notes (Signed)
Called Dr. Laurie Panda for sign out

## 2013-11-22 NOTE — Progress Notes (Signed)
Patient doing better, but still symptomatic.  For cholecystectomy today.  Risks and benefits have been explained to the patient and will proceed.  Kathryne Eriksson. Dahlia Bailiff, MD, [place] [phone] [facility]

## 2013-11-22 NOTE — Anesthesia Procedure Notes (Signed)
Procedure Name: Intubation Date/Time: 11/22/2013 12:23 PM Performed by: Rush Farmer E Pre-anesthesia Checklist: Patient identified, Emergency Drugs available, Suction available, Patient being monitored and Timeout performed Patient Re-evaluated:Patient Re-evaluated prior to inductionOxygen Delivery Method: Circle system utilized Preoxygenation: Pre-oxygenation with 100% oxygen Intubation Type: IV induction Ventilation: Mask ventilation without difficulty and Oral airway inserted - appropriate to patient size Laryngoscope Size: Mac and 3 Grade View: Grade I Tube type: Oral Tube size: 7.0 mm Number of attempts: 1 Airway Equipment and Method: Stylet Placement Confirmation: ETT inserted through vocal cords under direct vision,  positive ETCO2 and breath sounds checked- equal and bilateral Secured at: 21 cm Tube secured with: Tape Dental Injury: Teeth and Oropharynx as per pre-operative assessment

## 2013-11-22 NOTE — Anesthesia Preprocedure Evaluation (Addendum)
Anesthesia Evaluation  Patient identified by MRN, date of birth, ID band Patient awake    Reviewed: Allergy & Precautions, H&P , NPO status , Patient's Chart, lab work & pertinent test results, reviewed documented beta blocker date and time   Airway Mallampati: I      Dental  (+) Teeth Intact   Pulmonary          Cardiovascular hypertension, Pt. on home beta blockers     Neuro/Psych    GI/Hepatic   Endo/Other  Hypothyroidism   Renal/GU      Musculoskeletal   Abdominal   Peds  Hematology   Anesthesia Other Findings   Reproductive/Obstetrics                         Anesthesia Physical Anesthesia Plan  ASA: II  Anesthesia Plan: General   Post-op Pain Management:    Induction: Intravenous  Airway Management Planned: Oral ETT  Additional Equipment:   Intra-op Plan:   Post-operative Plan: Extubation in OR  Informed Consent: I have reviewed the patients History and Physical, chart, labs and discussed the procedure including the risks, benefits and alternatives for the proposed anesthesia with the patient or authorized representative who has indicated his/her understanding and acceptance.   Dental advisory given  Plan Discussed with: CRNA and Anesthesiologist  Anesthesia Plan Comments:         Anesthesia Quick Evaluation

## 2013-11-22 NOTE — Op Note (Addendum)
OPERATIVE REPORT  DATE OF OPERATION:  11/22/2013  PATIENT:  [name]  68 y.o. female  PRE-OPERATIVE DIAGNOSIS:  Cholelithiasis  POST-OPERATIVE DIAGNOSIS:  Cholelithiasis  PROCEDURE:  Procedure(s): LAPAROSCOPIC CHOLECYSTECTOMY WITH INTRAOPERATIVE CHOLANGIOGRAM  SURGEON:  Surgeon(s): Gwenyth Ober, MD  ASSISTANT: [place], NP-C  ANESTHESIA:   general  EBL: <30 ml  BLOOD ADMINISTERED: none  DRAINS: none   SPECIMEN:  Source of Specimen:  Gallbladder and stones  COUNTS CORRECT:  YES  PROCEDURE DETAILS:  The patient was taken to the operating room and placed on the table in the supine position.  After an adequate endotracheal anesthetic was administered, she was prepped with ChloroPrep, and then draped in the usual manner exposing the entire abdomen laterally, inferiorly and up  to the costal margins.  After a proper timeout was performed including identifying the patient and the procedure to be performed, a supra-umbilical 6.8TM midline incision was made using a #15 blade.  This was taken down to the fascia which was then incised with a #15 blade.  The edges of the fascia were tented up with Kocher clamps as the preperitoneal space was penetrated with a Kelly clamp into the peritoneum.  Once this was done, a pursestring suture of 0 Vicryl was passed around the fascial opening.  This was subsequently used to secure the [facility] cannula which was passed into the peritoneal cavity.  Once the [facility] cannula was in place, carbon dioxide gas was insufflated into the peritoneal cavity up to a maximal intra-abdominal pressure of 51mm Hg.The laparoscope, with attached camera and light source, was passed into the peritoneal cavity to visualize the direct insertion of two right upper quadrant 72mm cannulas, and a sup-xiphoid 23mm cannula.  Once all cannulas were in place, the dissection was begun.  Two ratcheted graspers were attached to the dome and infundibulum of the gallbladder and  retracted towards the anterior abdominal wall and the right upper quadrant.  Using cautery attached to a dissecting forceps, the peritoneum overlaying the triangle of Chalot and the hepatoduodenal triangle was dissected away exposing the cystic duct and the cystic artery.  A clip was placed on the gallbladder side of the cystic duct then the distal cystic duct was clipped multiple times then transected.  The gallbladder was then dissected out of the hepatic bed allowing the spillage of some bile and large stones from the gallbladder.  All the spilled stones were retrieved.  The gallbladder was retrieved from the abdomen (using an EndoCatch bag) without event.  Once the gallbladder was removed, the bed was inspected for hemostasis.  Once excellent hemostasis was obtained all gas and fluids were aspirated from above the liver, then the cannulas were removed.  The supra-umbilical incision was closed using the pursestring suture which was in place.   All 25mm or greater cannula sites were close using a running subcuticular stitch of 4-0 Monocryl.  5.50mm cannula sites were closed with Dermabond only.Steri-Strips and Tagaderm were used to complete the dressings at all sites.  At this point all needle, sponge, and instrument counts were correct.The patient was awakened from anesthesia and taken to the PACU in stable condition.   PATIENT DISPOSITION:  PACU - hemodynamically stable.   Gwenyth Ober 1/26/20151:28 PM

## 2013-11-22 NOTE — Preoperative (Addendum)
Beta Blockers   Reason not to administer Beta Blockers:BB will be given intraoperatively.

## 2013-11-23 ENCOUNTER — Encounter (HOSPITAL_COMMUNITY): Payer: Self-pay | Admitting: General Surgery

## 2013-11-23 LAB — COMPREHENSIVE METABOLIC PANEL
ALT: 75 U/L — AB (ref 0–35)
AST: 67 U/L — ABNORMAL HIGH (ref 0–37)
Albumin: 3.2 g/dL — ABNORMAL LOW (ref 3.5–5.2)
Alkaline Phosphatase: 65 U/L (ref 39–117)
BUN: 8 mg/dL (ref 6–23)
CALCIUM: 8 mg/dL — AB (ref 8.4–10.5)
CO2: 27 meq/L (ref 19–32)
CREATININE: 0.6 mg/dL (ref 0.50–1.10)
Chloride: 108 mEq/L (ref 96–112)
GLUCOSE: 123 mg/dL — AB (ref 70–99)
Potassium: 3.2 mEq/L — ABNORMAL LOW (ref 3.7–5.3)
Sodium: 145 mEq/L (ref 137–147)
Total Bilirubin: 0.3 mg/dL (ref 0.3–1.2)
Total Protein: 6 g/dL (ref 6.0–8.3)

## 2013-11-23 LAB — CBC WITH DIFFERENTIAL/PLATELET
Basophils Absolute: 0 10*3/uL (ref 0.0–0.1)
Basophils Relative: 0 % (ref 0–1)
EOS PCT: 1 % (ref 0–5)
Eosinophils Absolute: 0.1 10*3/uL (ref 0.0–0.7)
HEMATOCRIT: 34.2 % — AB (ref 36.0–46.0)
Hemoglobin: 11.5 g/dL — ABNORMAL LOW (ref 12.0–15.0)
LYMPHS ABS: 1 10*3/uL (ref 0.7–4.0)
Lymphocytes Relative: 13 % (ref 12–46)
MCH: 30.3 pg (ref 26.0–34.0)
MCHC: 33.6 g/dL (ref 30.0–36.0)
MCV: 90.2 fL (ref 78.0–100.0)
MONO ABS: 1 10*3/uL (ref 0.1–1.0)
Monocytes Relative: 13 % — ABNORMAL HIGH (ref 3–12)
Neutro Abs: 5.8 10*3/uL (ref 1.7–7.7)
Neutrophils Relative %: 74 % (ref 43–77)
Platelets: 218 10*3/uL (ref 150–400)
RBC: 3.79 MIL/uL — AB (ref 3.87–5.11)
RDW: 13.2 % (ref 11.5–15.5)
WBC: 7.8 10*3/uL (ref 4.0–10.5)

## 2013-11-23 MED ORDER — OXYCODONE-ACETAMINOPHEN 5-325 MG PO TABS: 1.0000 | ORAL_TABLET | ORAL | Status: DC | PRN

## 2013-11-23 NOTE — Discharge Summary (Signed)
  Physician Discharge Summary  Patient ID: [name] MRN: [id number] DOB/AGE: [birth year] 68 y.o.  Admit date: 11/21/2013 Discharge date: 11/23/2013  Admitting Diagnosis: Symptomatic cholelithiasis   Discharge Diagnosis Patient Active Problem List   Diagnosis Date Noted  . Acute cholecystitis 11/21/2013    Consultants none  Imaging: US Abdomen Complete  11/21/2013   CLINICAL DATA:  Right upper quadrant pain.  EXAM: ULTRASOUND ABDOMEN COMPLETE  COMPARISON:  Renal ultrasound 09/08/2008. Abdominal ultrasound 01/31/2006. CT 05/08/2004.  FINDINGS: Gallbladder:  Gallstones. Gallbladder wall thickness 2.7 mm. Negative Murphy sign. No pericholecystic fluid collection.  Common bile duct:  Diameter: 4.5 mm.  Liver:  Coarse dense echotexture. This suggests hepatocellular disease. Fatty infiltration cannot be excluded.  IVC:  No abnormality visualized.  Pancreas:  Echogenic. This could be seen with chronic pancreatitis and fatty infiltration.  Spleen:  Size and appearance within normal limits.  Right Kidney:  Length: 12.3 cm. Lower pole 2.6 x 2.5 x 2.2 cm simple cyst. Parapelvic cysts. Cortical thinning consistent with chronic renal disease. No hydronephrosis.  Left Kidney:  Length: 13.3 cm. Cortical thinning consistent with chronic renal disease. Parapelvic cysts. No hydronephrosis.  Abdominal aorta:  No aneurysm visualized.  Other findings:  None.  IMPRESSION: 1. Gallstones. Gallbladder wall thickness normal. Negative Murphy sign. No pericholecystic fluid collection . 2. Liver has a coarse dense echotexture. This suggests hepatocellular disease and/or fatty infiltration. 3. Echogenic pancreas. This could be seen with chronic pancreatitis and/or fatty infiltration. 4. Bilateral renal parapelvic cysts. Simple cyst noted on lower pole right kidney. No hydronephrosis.   Electronically Signed   By: Marcello Moores  Register   On: 11/21/2013 11:23    Procedures Laparoscopic cholecystectomy Dr. Hulen Skains  11/22/13  Hospital Course:  [name] is a 68 year old female who presented to [facility] with abdominal pain.  Workup showed gallstones without cholecystitis.  Patient was admitted and underwent procedure listed above.  Tolerated procedure well and was transferred to the floor.  Diet was advanced as tolerated.  On POD#1, the patient was voiding well, tolerating diet, ambulating well, pain well controlled, vital signs stable, incisions c/d/i and felt stable for discharge home.  Patient will follow up in our office in 3 weeks and knows to call with questions or concerns.  Physical Exam: General:  Alert, NAD, pleasant, comfortable Abd:  Soft, ND, mild tenderness, incisions C/D/I, drain with minimal sanguinous drainage     Medication List    ASK your doctor about these medications       amLODipine 5 MG tablet  Commonly known as:  NORVASC  Take 5 mg by mouth daily.     esomeprazole 20 MG capsule  Commonly known as:  NEXIUM  Take 20 mg by mouth as needed (for heartburn).     levothyroxine 75 MCG tablet  Commonly known as:  SYNTHROID, LEVOTHROID  Take 75 mcg by mouth daily before breakfast.     metoprolol succinate 50 MG 24 hr tablet  Commonly known as:  TOPROL-XL  Take 50 mg by mouth daily. Take with or immediately following a meal.             Follow-up Information   Follow up with [facility] On 12/14/2013. (arrive by 2:15pm for a 2:45pm appt)    Contact information:   [place]   [place] [phone]       Signed: Erby Pian, [facility] Surgery [phone]  11/23/2013, 9:11 AM

## 2013-11-23 NOTE — Progress Notes (Signed)
Discharge instructions and prescription for percocet given to pt.  Pt. Verbalized understanding of all instructions/orders.  IV's removed and pt. Tolerated well.  Pt. Discharged to home with daughter. Syliva Overman

## 2013-11-23 NOTE — Discharge Instructions (Signed)

## 2013-11-23 NOTE — Anesthesia Postprocedure Evaluation (Signed)
  Anesthesia Post-op Note  Patient: [name]  Procedure(s) Performed: Procedure(s): LAPAROSCOPIC CHOLECYSTECTOMY WITH INTRAOPERATIVE CHOLANGIOGRAM (N/A)  Patient Location: PACU  Anesthesia Type:General  Level of Consciousness: awake, alert , oriented and patient cooperative  Airway and Oxygen Therapy: Patient Spontanous Breathing  Post-op Pain: mild  Post-op Assessment: Post-op Vital signs reviewed, Patient's Cardiovascular Status Stable, Respiratory Function Stable, Patent Airway, No signs of Nausea or vomiting and Pain level controlled  Post-op Vital Signs: stable  Complications: No apparent anesthesia complications

## 2013-11-23 NOTE — Discharge Summary (Signed)
Okay to go home.  Windle Huebert O. Byrd Rushlow, III, MD, FACS [phone]--pager [phone]--office [facility]  

## 2013-12-14 ENCOUNTER — Encounter (INDEPENDENT_AMBULATORY_CARE_PROVIDER_SITE_OTHER): Payer: Self-pay | Admitting: General Surgery

## 2013-12-14 ENCOUNTER — Ambulatory Visit (INDEPENDENT_AMBULATORY_CARE_PROVIDER_SITE_OTHER): Payer: Medicare Other | Admitting: General Surgery

## 2013-12-14 VITALS — BP 140/86 | HR 80 | Temp 98.7°F | Resp 14 | Ht 67.0 in | Wt 205.2 lb

## 2013-12-14 DIAGNOSIS — K811 Chronic cholecystitis: Secondary | ICD-10-CM

## 2013-12-14 NOTE — Patient Instructions (Signed)
Follow up as needed

## 2013-12-14 NOTE — Progress Notes (Signed)
[name] [facility] 1946-06-13 [id number] 12/14/2013   [name] [place] is a 68 y.o. female who had a laparoscopic cholecystectomy with intraoperative cholangiogram by Dr. Judeth Horn.  The pathology report confirmed benign gallbladder mucosa with minimal associated chronic inflammation and choleithiasis.  The patient reports that they are feeling well with normal bowel movements and good appetite.  The pre-operative symptoms of abdominal pain, nausea, and vomiting have resolved.    Physical examination - Incisions appear well-healed with no sign of infection or bleeding.   Abdomen - soft, non-tender  Impression:  s/p laparoscopic cholecystectomy  Plan:  She may resume a regular diet and full activity.  She may follow-up on a PRN basis.

## 2014-02-03 ENCOUNTER — Inpatient Hospital Stay (HOSPITAL_COMMUNITY)
Admission: EM | Admit: 2014-02-03 | Discharge: 2014-02-04 | DRG: 756 | Disposition: A | Discharge status: Home / Self Care | Payer: Medicare Other | Attending: Internal Medicine | Admitting: Internal Medicine

## 2014-02-03 ENCOUNTER — Emergency Department (HOSPITAL_COMMUNITY): Payer: Medicare Other

## 2014-02-03 ENCOUNTER — Encounter (HOSPITAL_COMMUNITY): Payer: Self-pay | Admitting: Emergency Medicine

## 2014-02-03 DIAGNOSIS — I10 Essential (primary) hypertension: Secondary | ICD-10-CM

## 2014-02-03 DIAGNOSIS — Z9049 Acquired absence of other specified parts of digestive tract: Secondary | ICD-10-CM

## 2014-02-03 DIAGNOSIS — Z881 Allergy status to other antibiotic agents status: Secondary | ICD-10-CM

## 2014-02-03 DIAGNOSIS — R109 Unspecified abdominal pain: Secondary | ICD-10-CM

## 2014-02-03 DIAGNOSIS — C569 Malignant neoplasm of unspecified ovary: Principal | ICD-10-CM | POA: Diagnosis present

## 2014-02-03 DIAGNOSIS — R350 Frequency of micturition: Secondary | ICD-10-CM | POA: Diagnosis present

## 2014-02-03 DIAGNOSIS — K219 Gastro-esophageal reflux disease without esophagitis: Secondary | ICD-10-CM

## 2014-02-03 DIAGNOSIS — Z885 Allergy status to narcotic agent status: Secondary | ICD-10-CM

## 2014-02-03 DIAGNOSIS — E039 Hypothyroidism, unspecified: Secondary | ICD-10-CM

## 2014-02-03 DIAGNOSIS — R1900 Intra-abdominal and pelvic swelling, mass and lump, unspecified site: Secondary | ICD-10-CM

## 2014-02-03 DIAGNOSIS — J45909 Unspecified asthma, uncomplicated: Secondary | ICD-10-CM | POA: Diagnosis present

## 2014-02-03 HISTORY — DX: Diaphragmatic hernia without obstruction or gangrene: K44.9

## 2014-02-03 LAB — CBC WITH DIFFERENTIAL/PLATELET
Basophils Absolute: 0 10*3/uL (ref 0.0–0.1)
Basophils Relative: 0 % (ref 0–1)
EOS ABS: 0 10*3/uL (ref 0.0–0.7)
Eosinophils Relative: 0 % (ref 0–5)
HEMATOCRIT: 33.8 % — AB (ref 36.0–46.0)
HEMOGLOBIN: 11.4 g/dL — AB (ref 12.0–15.0)
LYMPHS ABS: 0.6 10*3/uL — AB (ref 0.7–4.0)
Lymphocytes Relative: 7 % — ABNORMAL LOW (ref 12–46)
MCH: 30.1 pg (ref 26.0–34.0)
MCHC: 33.7 g/dL (ref 30.0–36.0)
MCV: 89.2 fL (ref 78.0–100.0)
MONO ABS: 0.6 10*3/uL (ref 0.1–1.0)
Monocytes Relative: 6 % (ref 3–12)
NEUTROS PCT: 87 % — AB (ref 43–77)
Neutro Abs: 8.5 10*3/uL — ABNORMAL HIGH (ref 1.7–7.7)
Platelets: 358 10*3/uL (ref 150–400)
RBC: 3.79 MIL/uL — AB (ref 3.87–5.11)
RDW: 12.9 % (ref 11.5–15.5)
WBC: 9.8 10*3/uL (ref 4.0–10.5)

## 2014-02-03 LAB — COMPREHENSIVE METABOLIC PANEL
ALBUMIN: 3.8 g/dL (ref 3.5–5.2)
ALT: 14 U/L (ref 0–35)
AST: 26 U/L (ref 0–37)
Alkaline Phosphatase: 82 U/L (ref 39–117)
BUN: 21 mg/dL (ref 6–23)
CALCIUM: 9.2 mg/dL (ref 8.4–10.5)
CO2: 25 mEq/L (ref 19–32)
Chloride: 102 mEq/L (ref 96–112)
Creatinine, Ser: 0.73 mg/dL (ref 0.50–1.10)
GFR calc Af Amer: >90 mL/min (ref >90)
GFR, EST NON AFRICAN AMERICAN: 86 mL/min — AB (ref >90)
Glucose, Bld: 114 mg/dL — ABNORMAL HIGH (ref 70–99)
Potassium: 4.3 mEq/L (ref 3.7–5.3)
SODIUM: 142 meq/L (ref 137–147)
TOTAL PROTEIN: 7.2 g/dL (ref 6.0–8.3)
Total Bilirubin: 0.4 mg/dL (ref 0.3–1.2)

## 2014-02-03 LAB — URINE MICROSCOPIC-ADD ON

## 2014-02-03 LAB — URINALYSIS, ROUTINE W REFLEX MICROSCOPIC
Bilirubin Urine: NEGATIVE
Glucose, UA: NEGATIVE mg/dL
Ketones, ur: NEGATIVE mg/dL
LEUKOCYTES UA: NEGATIVE
Nitrite: NEGATIVE
PH: 6 (ref 5.0–8.0)
Protein, ur: NEGATIVE mg/dL
Specific Gravity, Urine: 1.022 (ref 1.005–1.030)
Urobilinogen, UA: 0.2 mg/dL (ref 0.0–1.0)

## 2014-02-03 LAB — LIPASE, BLOOD: Lipase: 14 U/L (ref 11–59)

## 2014-02-03 MED ORDER — IOHEXOL 300 MG/ML  SOLN
80.0000 mL | Freq: Once | INTRAMUSCULAR | Status: AC | PRN
  Administered 2014-02-03: 80 mL via INTRAVENOUS

## 2014-02-03 MED ORDER — PROMETHAZINE HCL 25 MG/ML IJ SOLN
12.5000 mg | Freq: Four times a day (QID) | INTRAMUSCULAR | Status: DC | PRN
  Administered 2014-02-03: 12.5 mg via INTRAVENOUS
  Filled 2014-02-03: qty 1

## 2014-02-03 MED ORDER — SODIUM CHLORIDE 0.9 % IV BOLUS (SEPSIS)
250.0000 mL | Freq: Once | INTRAVENOUS | Status: AC
  Administered 2014-02-03: 250 mL via INTRAVENOUS

## 2014-02-03 MED ORDER — SODIUM CHLORIDE 0.9 % IV SOLN
INTRAVENOUS | Status: DC
  Administered 2014-02-04: 05:00:00 via INTRAVENOUS

## 2014-02-03 MED ORDER — IOHEXOL 300 MG/ML  SOLN
25.0000 mL | Freq: Once | INTRAMUSCULAR | Status: AC | PRN
  Administered 2014-02-03: 25 mL via ORAL

## 2014-02-03 NOTE — ED Notes (Signed)
Patient transported to CT 

## 2014-02-03 NOTE — ED Provider Notes (Signed)
CSN: [id number]     Arrival date & time 02/03/14  1455 History   First MD Initiated Contact with Patient 02/03/14 1910     Chief Complaint  Patient presents with  . Urinary Frequency     (Consider location/radiation/quality/duration/timing/severity/associated sxs/prior Treatment) Patient is a 68 y.o. female presenting with frequency. The history is provided by the patient and a relative.  Urinary Frequency Associated symptoms include abdominal pain. Pertinent negatives include no chest pain, no headaches and no shortness of breath.   patient status post gallbladder removal 11/22/2013 since that time has not felt right in the abdomen is felt as if it's bloated distended some discomfort around the periumbilical area occasional epigastric discomfort. 2 weeks ago patient had some discomfort in the suprapubic area had a urinalysis that showed the urinary tract infection at least and 30 patient was started on Cipro. Patient started to feel little bit better after 2 days of the Cipro however then non-did not improve completely and is now back to the way she was feeling before.   patient's had some nausea and vomiting over the last 2 weeks. The abdominal pain is not severe to mild 2/10 some back pain not made better or worse by anything.  Past Medical History  Diagnosis Date  . Thyroid disease   . Hypertension   . Anemia   . Asthma   . Hiatal hernia    Past Surgical History  Procedure Laterality Date  . Tonsillectomy    . Abdominal hysterectomy    . Thyroidectomy, partial    . Cholecystectomy N/A 11/22/2013    Procedure: LAPAROSCOPIC CHOLECYSTECTOMY WITH INTRAOPERATIVE CHOLANGIOGRAM;  Surgeon: Gwenyth Ober, MD;  Location: [facility];  Service: General;  Laterality: N/A;   Family History  Problem Relation Age of Onset  . Cancer Father     lung   History  Substance Use Topics  . Smoking status: Never Smoker   . Smokeless tobacco: Never Used  . Alcohol Use: No   OB History   Grav Para  Term Preterm Abortions TAB SAB Ect Mult Living                 Review of Systems  Constitutional: Negative for fever.  HENT: Negative for congestion.   Eyes: Negative for visual disturbance.  Respiratory: Negative for shortness of breath.   Cardiovascular: Negative for chest pain.  Gastrointestinal: Positive for nausea, vomiting, abdominal pain and abdominal distention. Negative for diarrhea.  Genitourinary: Positive for dysuria and frequency.  Musculoskeletal: Positive for back pain. Negative for myalgias and neck pain.  Skin: Negative for rash.  Neurological: Negative for headaches.  Hematological: Does not bruise/bleed easily.  Psychiatric/Behavioral: Negative for confusion.      Allergies  Epinephrine; Codeine; Demerol; Latex; Macrodantin; Micardis; Protonix; Sulfa antibiotics; and Zofran  Home Medications   Current Outpatient Rx  Name  Route  Sig  Dispense  Refill  . amLODipine (NORVASC) 5 MG tablet   Oral   Take 5 mg by mouth daily.         Marland Kitchen esomeprazole (NEXIUM) 20 MG capsule   Oral   Take 20 mg by mouth as needed (for heartburn).         Marland Kitchen levothyroxine (SYNTHROID, LEVOTHROID) 75 MCG tablet   Oral   Take 75 mcg by mouth daily before breakfast.         . metoprolol succinate (TOPROL-XL) 50 MG 24 hr tablet   Oral   Take 50 mg by mouth daily. Take  with or immediately following a meal.          BP 128/65  Pulse 76  Temp(Src) 98.4 F (36.9 C) (Oral)  Resp 16  SpO2 96% Physical Exam  Nursing note and vitals reviewed. Constitutional: She is oriented to person, place, and time. She appears well-developed and well-nourished. No distress.  HENT:  Head: Normocephalic and atraumatic.  Mouth/Throat: Oropharynx is clear and moist.  Eyes: Conjunctivae and EOM are normal. Pupils are equal, round, and reactive to light.  Neck: Normal range of motion. Neck supple.  Cardiovascular: Normal rate and normal heart sounds.   Pulmonary/Chest: Effort normal and  breath sounds normal. No respiratory distress.  Abdominal: Soft. Bowel sounds are normal. There is tenderness.  Very mild tenderness periumbilical no lower quadrant tenderness. No guarding.  Neurological: She is alert and oriented to person, place, and time. No cranial nerve deficit. She exhibits normal muscle tone. Coordination normal.  Skin: No rash noted. No erythema.    ED Course  Procedures (including critical care time) Labs Review Labs Reviewed  COMPREHENSIVE METABOLIC PANEL - Abnormal; Notable for the following:    Glucose, Bld 114 (*)    GFR calc non Af Amer 86 (*)    All other components within normal limits  CBC WITH DIFFERENTIAL - Abnormal; Notable for the following:    RBC 3.79 (*)    Hemoglobin 11.4 (*)    HCT 33.8 (*)    Neutrophils Relative % 87 (*)    Neutro Abs 8.5 (*)    Lymphocytes Relative 7 (*)    Lymphs Abs 0.6 (*)    All other components within normal limits  LIPASE, BLOOD  URINALYSIS, ROUTINE W REFLEX MICROSCOPIC   Results for orders placed during the hospital encounter of 02/03/14  COMPREHENSIVE METABOLIC PANEL      Result Value Ref Range   Sodium 142  137 - 147 mEq/L   Potassium 4.3  3.7 - 5.3 mEq/L   Chloride 102  96 - 112 mEq/L   CO2 25  19 - 32 mEq/L   Glucose, Bld 114 (*) 70 - 99 mg/dL   BUN 21  6 - 23 mg/dL   Creatinine, Ser 0.73  0.50 - 1.10 mg/dL   Calcium 9.2  8.4 - 10.5 mg/dL   Total Protein 7.2  6.0 - 8.3 g/dL   Albumin 3.8  3.5 - 5.2 g/dL   AST 26  0 - 37 U/L   ALT 14  0 - 35 U/L   Alkaline Phosphatase 82  39 - 117 U/L   Total Bilirubin 0.4  0.3 - 1.2 mg/dL   GFR calc non Af Amer 86 (*) >90 mL/min   GFR calc Af Amer >90  >90 mL/min  CBC WITH DIFFERENTIAL      Result Value Ref Range   WBC 9.8  4.0 - 10.5 K/uL   RBC 3.79 (*) 3.87 - 5.11 MIL/uL   Hemoglobin 11.4 (*) 12.0 - 15.0 g/dL   HCT 33.8 (*) 36.0 - 46.0 %   MCV 89.2  78.0 - 100.0 fL   MCH 30.1  26.0 - 34.0 pg   MCHC 33.7  30.0 - 36.0 g/dL   RDW 12.9  11.5 - 15.5 %    Platelets 358  150 - 400 K/uL   Neutrophils Relative % 87 (*) 43 - 77 %   Neutro Abs 8.5 (*) 1.7 - 7.7 K/uL   Lymphocytes Relative 7 (*) 12 - 46 %   Lymphs Abs 0.6 (*)  0.7 - 4.0 K/uL   Monocytes Relative 6  3 - 12 %   Monocytes Absolute 0.6  0.1 - 1.0 K/uL   Eosinophils Relative 0  0 - 5 %   Eosinophils Absolute 0.0  0.0 - 0.7 K/uL   Basophils Relative 0  0 - 1 %   Basophils Absolute 0.0  0.0 - 0.1 K/uL  LIPASE, BLOOD      Result Value Ref Range   Lipase 14  11 - 59 U/L    Imaging Review No results found.   EKG Interpretation None      MDM   Final diagnoses:  Abdominal pain    Patient status post of gallbladder removal on 11/22/2013 patient's abdomen is not felt right since that time she's had a bloating feeling some periumbilical epigastric discomfort. Starting 2 weeks ago she started with some nausea occasional vomiting had died urinalysis done with presumed cystitis and started on Cipro and peridium. Patient started to feel better for a couple days never got completely better and now is back to not feeling well.  Basic labs without significant abnormalities. CT scan of the abdomen will be done at the patient's request she is very concerned why she hasn't gotten better from surgery. Labs show no direct evidence of leukocytosis suggestive of abscess or any complications from the gallbladder surgery. No elevation in her bilirubin her liver function tests. Urinalysis is still pending. However urine looks fairly clear.  Disposition will be based on the CT scan.    Mervin Kung, MD 02/03/14 [phone]

## 2014-02-03 NOTE — ED Notes (Signed)
She went to urgent care on Thursday for urinary frequency and was started on cipro and pyridium. She completed the medications but she continues to "feel bad" and noticed her abd seems swollen today.

## 2014-02-04 ENCOUNTER — Encounter (HOSPITAL_COMMUNITY): Payer: Self-pay | Admitting: *Deleted

## 2014-02-04 ENCOUNTER — Inpatient Hospital Stay (HOSPITAL_COMMUNITY): Payer: Medicare Other

## 2014-02-04 DIAGNOSIS — Z9049 Acquired absence of other specified parts of digestive tract: Secondary | ICD-10-CM

## 2014-02-04 DIAGNOSIS — I10 Essential (primary) hypertension: Secondary | ICD-10-CM | POA: Diagnosis present

## 2014-02-04 DIAGNOSIS — R1900 Intra-abdominal and pelvic swelling, mass and lump, unspecified site: Secondary | ICD-10-CM

## 2014-02-04 DIAGNOSIS — K219 Gastro-esophageal reflux disease without esophagitis: Secondary | ICD-10-CM | POA: Diagnosis present

## 2014-02-04 DIAGNOSIS — E039 Hypothyroidism, unspecified: Secondary | ICD-10-CM

## 2014-02-04 MED ORDER — HYDROMORPHONE HCL PF 1 MG/ML IJ SOLN
1.0000 mg | Freq: Once | INTRAMUSCULAR | Status: AC
  Administered 2014-02-04: 1 mg via INTRAVENOUS
  Filled 2014-02-04: qty 1

## 2014-02-04 MED ORDER — PROMETHAZINE HCL 12.5 MG PO TABS: 12.5000 mg | ORAL_TABLET | Freq: Four times a day (QID) | ORAL | Status: DC | PRN

## 2014-02-04 MED ORDER — GADOBENATE DIMEGLUMINE 529 MG/ML IV SOLN
20.0000 mL | Freq: Once | INTRAVENOUS | Status: AC
  Administered 2014-02-04: 20 mL via INTRAVENOUS

## 2014-02-04 MED ORDER — ENOXAPARIN SODIUM 40 MG/0.4ML ~~LOC~~ SOLN
40.0000 mg | SUBCUTANEOUS | Status: DC
  Administered 2014-02-04: 40 mg via SUBCUTANEOUS
  Filled 2014-02-04: qty 0.4

## 2014-02-04 MED ORDER — PROMETHAZINE HCL 25 MG PO TABS
12.5000 mg | ORAL_TABLET | Freq: Four times a day (QID) | ORAL | Status: DC | PRN
  Administered 2014-02-04: 12.5 mg via ORAL
  Filled 2014-02-04: qty 1

## 2014-02-04 MED ORDER — OXYCODONE HCL 5 MG PO TABS: 5.0000 mg | ORAL_TABLET | ORAL | Status: DC | PRN

## 2014-02-04 MED ORDER — LEVOTHYROXINE SODIUM 75 MCG PO TABS
75.0000 ug | ORAL_TABLET | Freq: Every day | ORAL | Status: DC
  Administered 2014-02-04: 75 ug via ORAL
  Filled 2014-02-04: qty 1

## 2014-02-04 MED ORDER — AMLODIPINE BESYLATE 5 MG PO TABS
5.0000 mg | ORAL_TABLET | Freq: Every day | ORAL | Status: DC
  Administered 2014-02-04: 5 mg via ORAL
  Filled 2014-02-04: qty 1

## 2014-02-04 MED ORDER — OXYCODONE HCL 5 MG PO TABS
5.0000 mg | ORAL_TABLET | ORAL | Status: DC | PRN
  Administered 2014-02-04: 5 mg via ORAL
  Filled 2014-02-04: qty 1

## 2014-02-04 MED ORDER — ACETAMINOPHEN 650 MG RE SUPP: 650.0000 mg | Freq: Four times a day (QID) | RECTAL | Status: DC | PRN

## 2014-02-04 MED ORDER — SENNA 8.6 MG PO TABS
1.0000 | ORAL_TABLET | Freq: Two times a day (BID) | ORAL | Status: DC
  Administered 2014-02-04: 8.6 mg via ORAL
  Filled 2014-02-04: qty 1

## 2014-02-04 MED ORDER — ACETAMINOPHEN 325 MG PO TABS: 650.0000 mg | ORAL_TABLET | Freq: Four times a day (QID) | ORAL | Status: DC | PRN

## 2014-02-04 MED ORDER — METOPROLOL SUCCINATE ER 50 MG PO TB24
50.0000 mg | ORAL_TABLET | Freq: Every day | ORAL | Status: DC
  Administered 2014-02-04: 50 mg via ORAL
  Filled 2014-02-04: qty 1

## 2014-02-04 NOTE — ED Notes (Signed)
Junius Creamer PA in to update pt and family on plan of care.  Pt and family voices understanding.

## 2014-02-04 NOTE — ED Provider Notes (Signed)
Radiology called informing me that the patient has an abdominal mass and MRI for differentiation.  I discussed this with the patient.  She is willing to stay in the hospital for further evaluation and definitive diagnosis.  I made arrangements to be admitted to the medicine service.  MRI has been ordered  Garald Balding, NP 02/04/14 0028

## 2014-02-04 NOTE — H&P (Signed)
[facility] History and Physical  Patient: [name]  NTI:[id number]  DOB: [birth year]  DOS: the patient was seen and examined on 02/04/2014 PCP: Dwan Bolt, MD  Chief Complaint: Increased frequency of urination  HPI: [name] is a 68 y.o. female with Past medical history of hypothyroidism, hypertension, GERD. The patient presented with complaints of increased frequency of urination. She mentions that she had a laparoscopic cholecystectomy in January 2015 after that for 9 days she felt good but after that she started having episodes of increasing urinary frequency. She tried some over-the-counter medication without any benefit. She was started on Cipro and pyridium, again without any benefit. She mentions that since then she has been feeling bad since February. She started having complaints of cramping dull pain in her lower abdomen since last 3 weeks which is progressively getting worse. She also felt that her abdomen has been distended. She had episode of vomiting today without any blood. She complained of nausea and decreased appetite. She denies any weight change. She denies any lumps or bumps anywhere, no blood in her urine no blood in her bowel. She denies any change in her bowel habits. No chest pain shortness of breath fever chills. No rash.  The patient is coming from home. And at her baseline Independent for most of her  ADL.  Review of Systems: as mentioned in the history of present illness.  A Comprehensive review of the other systems is negative.  Past Medical History  Diagnosis Date  . Thyroid disease   . Hypertension   . Anemia   . Asthma   . Hiatal hernia    Past Surgical History  Procedure Laterality Date  . Tonsillectomy    . Abdominal hysterectomy    . Thyroidectomy, partial    . Cholecystectomy N/A 11/22/2013    Procedure: LAPAROSCOPIC CHOLECYSTECTOMY WITH INTRAOPERATIVE CHOLANGIOGRAM;  Surgeon: Gwenyth Ober, MD;  Location: [facility];   Service: General;  Laterality: N/A;   Social History:  reports that she has never smoked. She has never used smokeless tobacco. She reports that she does not drink alcohol or use illicit drugs.  Allergies  Allergen Reactions  . Epinephrine Anaphylaxis  . Codeine Other (See Comments)    "ill"  . Demerol [Meperidine] Other (See Comments)    "ill"  . Latex Itching    Says gloves "used" to cause itching.. Not sure today if problem persists.  Clancy Gourd [Nitrofurantoin Macrocrystal] Other (See Comments)    "made her feel reclusive"  . Micardis [Telmisartan] Other (See Comments)    "didn't move for 6 hours"  . Protonix [Pantoprazole Sodium] Other (See Comments)    "ill"  . Sulfa Antibiotics Other (See Comments)    "blind spell"  . Zofran [Ondansetron Hcl] Other (See Comments)    "ill"    Family History  Problem Relation Age of Onset  . Cancer Father     lung    Prior to Admission medications   Medication Sig Start Date End Date Taking? Authorizing Provider  amLODipine (NORVASC) 5 MG tablet Take 5 mg by mouth daily.   Yes Historical Provider, MD  esomeprazole (NEXIUM) 20 MG capsule Take 20 mg by mouth as needed (for heartburn).   Yes Historical Provider, MD  levothyroxine (SYNTHROID, LEVOTHROID) 75 MCG tablet Take 75 mcg by mouth daily before breakfast.   Yes Historical Provider, MD  metoprolol succinate (TOPROL-XL) 50 MG 24 hr tablet Take 50 mg by mouth daily. Take with or immediately following a  meal.   Yes Historical Provider, MD    Physical Exam: Filed Vitals:   02/04/14 0000 02/04/14 0030 02/04/14 0100 02/04/14 0210  BP: 113/59 131/64 117/53 147/58  Pulse: 62 68 64 80  Temp:    98 F (36.7 C)  TempSrc:    Oral  Resp:    16  Height:    5\' 7"  (1.702 m)  Weight:    94.121 kg (207 lb 8 oz)  SpO2: 96% 99% 96% 100%    General: Alert, Awake and Oriented to Time, Place and Person. Appear in moderate distress Eyes: PERRL ENT: Oral Mucosa clear moist. Neck: no  JVD Cardiovascular: S1 and S2 Present, no Murmur, Peripheral Pulses Present Respiratory: Bilateral Air entry equal and Decreased, Clear to Auscultation,  no Crackles,no wheezes Abdomen: Bowel Sound Present, Soft and distended, mildly tender in lower abdomen Skin: no Rash Extremities: Bilateral Pedal edema, no calf tenderness Neurologic: Grossly Unremarkable.  Labs on Admission:  CBC:  Recent Labs Lab 02/03/14 1436  WBC 9.8  NEUTROABS 8.5*  HGB 11.4*  HCT 33.8*  MCV 89.2  PLT 358    CMP     Component Value Date/Time   NA 142 02/03/2014 1436   K 4.3 02/03/2014 1436   CL 102 02/03/2014 1436   CO2 25 02/03/2014 1436   GLUCOSE 114* 02/03/2014 1436   BUN 21 02/03/2014 1436   CREATININE 0.73 02/03/2014 1436   CALCIUM 9.2 02/03/2014 1436   PROT 7.2 02/03/2014 1436   ALBUMIN 3.8 02/03/2014 1436   AST 26 02/03/2014 1436   ALT 14 02/03/2014 1436   ALKPHOS 82 02/03/2014 1436   BILITOT 0.4 02/03/2014 1436   GFRNONAA 86* 02/03/2014 1436   GFRAA >90 02/03/2014 1436     Recent Labs Lab 02/03/14 1436  LIPASE 14   No results found for this basename: AMMONIA,  in the last 168 hours  No results found for this basename: CKTOTAL, CKMB, CKMBINDEX, TROPONINI,  in the last 168 hours BNP (last 3 results) No results found for this basename: PROBNP,  in the last 8760 hours  Radiological Exams on Admission: Ct Abdomen Pelvis W Contrast  02/04/2014   CLINICAL DATA:  Urinary frequency  EXAM: CT ABDOMEN AND PELVIS WITH CONTRAST  TECHNIQUE: Multidetector CT imaging of the abdomen and pelvis was performed using the standard protocol following bolus administration of intravenous contrast.  CONTRAST:  71mL OMNIPAQUE IOHEXOL 300 MG/ML  SOLN  COMPARISON:  Prior CT from 05/08/2004  FINDINGS: A small right pleural effusion with associated right basilar atelectasis is present.  The liver demonstrates a normal contrast enhanced appearance. Small volume free perihepatic fluid is present. The gallbladder surgically absent. No biliary  ductal dilatation. Spleen, adrenal glands, and pancreas are normal. Small volume free fluid noted adjacent to the spleen.  Multiple parapelvic cysts noted within the kidneys bilaterally. There is mild bilateral hydronephrosis. No hydroureter. Additional renal cyst present bilaterally. No hydronephrosis or nephrolithiasis. No focal enhancing renal mass.  There is no evidence of bowel obstruction. No abnormal wall thickening or inflammatory fat stranding seen about the bowels. Tiny fat containing paraumbilical hernia noted.  A large heterogeneous mass measuring approximately 19.4 x 13.6 x 14.3 cm is seen within the lower mid abdomen/pelvis. The uterus and ovaries are not definitely visualized. The bladder is compressed and displaced anteriorly by this collection. A few loops of bowel are seen interspersed within this collection along its right lateral aspect (series 2, image 71).  There is abnormal intermediate density fluid/soft  tissues seen along the right pericolic gutter (series 2, image 48). Additionally, nodular peritoneal thickening seen along the left pericolic gutter is well (series 2, image 46). Tiny soft tissue densities seen within the omental fat of the left anterior hemi abdomen (series 2, image 39).  No free intraperitoneal air. No enlarged intra-abdominal pelvic lymph nodes.  No acute osseous abnormality. No worrisome lytic or blastic osseous lesions.  IMPRESSION: 1. Large heterogeneous mixed attenuation mass within the lower mid pelvis as above. Finding is concerning for malignancy, with possible sarcomatous lesions and/or ovarian tumor in the differential (per EPIC, the patient is status post hysterectomy, it is unknown at this time whether ovaries were resected at that time as well). Further evaluation with MRI recommended. 2. Small volume ascites adjacent to the liver and spleen. 3. Additional fluid/soft tissue density along the pericolic gutters bilaterally and left anterior abdomen are suspicious  for peritoneal implants. 4. Mild bilateral hydronephrosis, likely related to compression of the large pelvic mass. 5. Small right pleural effusion.   Electronically Signed   By: Jeannine Boga M.D.   On: 02/04/2014 00:11    Assessment/Plan Principal Problem:   Abdominal mass Active Problems:   Essential hypertension, benign   Hypothyroidism   GERD (gastroesophageal reflux disease)   Hx laparoscopic cholecystectomy   1. Abdominal mass The patient is presenting with complaints of abdominal distention, nausea and vomiting, decreased appetite, increased urinary frequency not improving with treatment with over-the-counter medication as well as antibiotics. Patient undergone a CT of her abdomen which shows a new large heterogenous mass in her lower abdomen and pelvic area almost 19 cm in size with scattered soft tissue density in anterior abdomen as well as in pericolic area. CT has been read as suspicious for sarcomatous lesion/ovarian tumor this was informed to patient. As per recommendation the patient will be admitted to the hospital for further workup including MRI of her abdomen and pelvis and surgical consultation either general surgery or OB/GYN depending on the possible etiology of the lesion after MRI.  2. Hypertension Continue home antihypertensive medications  3. Hypothyroidism Continue Synthroid  DVT Prophylaxis: subcutaneous Heparin Nutrition: Regular diet  Code Status: Full  Family Communication: Family was present at bedside, opportunity was given to ask question and all questions were answered satisfactorily at the time of interview. Disposition: Admitted to inpatient in med-surge unit.  Author: Berle Mull, MD [facility] Pager: [phone] 02/04/2014, 3:02 AM    If 7PM-7AM, please contact night-coverage [email or web address] Password [email or web address]

## 2014-02-04 NOTE — Discharge Instructions (Signed)
Ovarian Tumors The ovaries are small organs that produce eggs in women. They lie on each side of the uterus. Tumors are solid growths on the ovary, not like ovarian cysts that are filled with fluid. They can be cancerous or noncancerous. All solid tumors should be looked at to make sure they are not cancerous tumors.  RISK FACTORS There are no known causes for ovarian tumors. However, there are several risk factors for developing cancerous tumors on the ovary, such as:  Age.  [place] or [place] European descent.  Personal or family history of ovarian, colon, or breast cancer.  Presence of BRCA1 or BRCA2 genes.  Use of fertility medicines.  Late menopause (older than 50 years).  Pregnancy for the first time at age 69 years or older. SIGNS AND SYMPTOMS  In many cases there are no symptoms. Noncancerous tumors usually have no symptoms, but cancerous tumors may have symptoms that are minor and resemble other health problems. The following are symptoms of ovarian tumors:  Unexplained weight loss.  Increased abdominal size.  Belly (abdominal) pain.  Back or pelvis pain or pressure.  Tiredness.  Abnormal vaginal bleeding.  Loss of appetite.  Frequent urination or pressure on your bladder.  Indigestion, increased gas, and bloating.  Painful sexual intercourse. DIAGNOSIS  The following test may be done to help diagnose ovarian tumors:  An ultrasound exam.  Imaging exams, such as an X-ray exam, CT scan, or MRI.  Blood tests. TREATMENT   The tumor will be studied in the lab under a microscope to see if it is cancer.  Noncancerous tumors can be removed surgically with or without removing the ovary.  Cancerous tumors usually are removed with the ovary and sometimes both ovaries are removed with the fallopian tubes, uterus, and surrounding lymph nodes to see if the cancer has spread.  Cancerous tumors may also be treated along with surgery and radiation,  chemotherapy, or both. HOME CARE INSTRUCTIONS   Follow your health care provider's advice and recommendations regarding medicine and follow-up care.  Get a yearly physical and gynecology exam. This includes a pelvic exam if you are aged 69 years or older. SEEK MEDICAL CARE IF:   You have any of the above symptoms that have not gone away after a week of treatment.  You are losing weight for no reason.  You feel generally ill. Document Released: 07/23/2008 Document Revised: 08/04/2013 Document Reviewed: 05/13/2013 [facility] Patient Information 2014 [place], [place].

## 2014-02-04 NOTE — Progress Notes (Signed)
Discharge instructions given to Pt and explain to pt.

## 2014-02-05 DIAGNOSIS — R1900 Intra-abdominal and pelvic swelling, mass and lump, unspecified site: Secondary | ICD-10-CM

## 2014-02-05 DIAGNOSIS — E039 Hypothyroidism, unspecified: Secondary | ICD-10-CM

## 2014-02-05 DIAGNOSIS — I10 Essential (primary) hypertension: Secondary | ICD-10-CM

## 2014-02-05 LAB — CEA: CEA: 1 ng/mL (ref 0.0–5.0)

## 2014-02-05 LAB — CA 125: CA 125: 67.9 U/mL — AB (ref 0.0–30.2)

## 2014-02-05 MED ORDER — HYDROMORPHONE HCL 2 MG PO TABS: 2.0000 mg | ORAL_TABLET | ORAL | Status: DC | PRN

## 2014-02-05 MED ORDER — OXYCODONE HCL ER 10 MG PO T12A: 10.0000 mg | EXTENDED_RELEASE_TABLET | Freq: Two times a day (BID) | ORAL | Status: DC

## 2014-02-05 NOTE — Discharge Summary (Addendum)
Physician Discharge Summary  [name] OAC:[id number] DOB: 1945/11/19 DOA: 02/03/2014  PCP: Dwan Bolt, MD  Admit date: 02/03/2014 Discharge date: 02/04/2014  Time spent: 35 minutes  Recommendations for Outpatient Follow-up:  1. Gynecologic oncology clinic will follow up with patient on Monday 4/13. They will call her for appointment to be seen that same week 2. They will followup on CA 125 and CEA levels  Discharge Diagnoses:  Principal Problem:   Abdominal mass Active Problems:   Essential hypertension, benign   Hypothyroidism   GERD (gastroesophageal reflux disease)   Hx laparoscopic cholecystectomy   Pelvic mass   Discharge Condition: Improved, being discharged home  Diet recommendation: Low sodium heart healthy  Filed Weights   02/04/14 0210  Weight: 94.121 kg (207 lb 8 oz)    History of present illness:  Patient is a relatively healthy 69 year old white female with past medical history of hypertension and hypothyroidism who is status post a laparoscopic cholecystectomy in January of this year who did well for the first few weeks, but then started noticing increased episodes of urinary frequency. She tried over-the-counter medication and then Cipro) DM but without any benefit. The last 3 weeks, patient having dull cramping pain in her lower abdomen to the point where she could not take it anymore. She's noticed no blood in her urine or bowel. No weight loss. When she came into the emergency room, a CT scan of the abdomen noted a 20 x 13 x 13 cm lower pelvic mass, suspicious for malignancy. Patient was admitted to the hospital service for further workup.  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 CEA and CA 125  levels were ordered. By the time the patient was discharged, these are not back, however time of this dictation they are back noting normal CEA level, but elevated CA 125 level at 67.9. Case was discussed with gynecologic oncologist on-call at [facility]. He recommended no further workup in the hospital. Plan will be for patient to be discharged home and the gynecologic oncologic clinic which is staffed by a [facility] physician, but he is located at [facility], will call her on Monday 4/13 for scheduling followup appointment and likely surgery soon. Patient is amenable to this plan she was discharged home.  Active Problems:   Essential hypertension, benign: Stable continued on her meds    Hypothyroidism: Stable continue on Synthroid   GERD (gastroesophageal reflux disease)    Abdominal pain: Patient was given doses of IV pain medication which did improve her symptoms. She being discharged on prescriptions for OxyIR and Phenergan when necessary  Procedures:  None  Consultations:  Case discussed with gynecologic oncology  Discharge Exam: Filed Vitals:   02/04/14 1326  BP: 119/53  Pulse: 62  Temp: 98.6 F (37 C)  Resp: 15    General: Alert and oriented x3, mild distress secondary to diagnoses Cardiovascular: Regular rate and rhythm, S1-S2 Respiratory:  Clear to auscultation bilaterally  Discharge Instructions You were cared for by a hospitalist during your hospital stay. If you have any questions about your discharge medications or the care you received while you were in the hospital after you are discharged, you can call the unit and asked to speak with the hospitalist on call if  the hospitalist that took care of you is not available. Once you are discharged, your primary care physician will handle any further medical issues. Please note that NO REFILLS for any discharge medications will be authorized once you are discharged, as it is imperative that you return to your primary  care physician (or establish a relationship with a primary care physician if you do not have one) for your aftercare needs so that they can reassess your need for medications and monitor your lab values.  Discharge Orders   Future Appointments Provider Department Dept Phone   02/22/2014 10:40 AM Gwenyth Ober, MD [facility], [place] [phone]   Future Orders Complete By Expires   Diet - low sodium heart healthy  As directed    Increase activity slowly  As directed        Medication List         amLODipine 5 MG tablet  Commonly known as:  NORVASC  Take 5 mg by mouth daily.     esomeprazole 20 MG capsule  Commonly known as:  NEXIUM  Take 20 mg by mouth as needed (for heartburn).     levothyroxine 75 MCG tablet  Commonly known as:  SYNTHROID, LEVOTHROID  Take 75 mcg by mouth daily before breakfast.     metoprolol succinate 50 MG 24 hr tablet  Commonly known as:  TOPROL-XL  Take 50 mg by mouth daily. Take with or immediately following a meal.     oxyCODONE 5 MG immediate release tablet  Commonly known as:  Oxy IR/ROXICODONE  Take 1 tablet (5 mg total) by mouth every 4 (four) hours as needed for moderate pain.     promethazine 12.5 MG tablet  Commonly known as:  PHENERGAN  Take 1 tablet (12.5 mg total) by mouth every 6 (six) hours as needed for nausea or vomiting.       Allergies  Allergen Reactions  . Epinephrine Anaphylaxis  . Codeine Other (See Comments)    "ill"  . Demerol [Meperidine] Other (See Comments)    "ill"  . Latex Itching    Says gloves "used" to cause itching.. Not sure today if problem persists.  Clancy Gourd [Nitrofurantoin Macrocrystal] Other (See Comments)    "made her feel reclusive"  . Micardis [Telmisartan] Other (See Comments)    "didn't move for 6 hours"  . Protonix [Pantoprazole Sodium] Other (See Comments)    "ill"  . Sulfa Antibiotics Other (See Comments)    "blind spell"  . Zofran [Ondansetron Hcl] Other (See Comments)     "ill"       Follow-up Information   Follow up with [facility]. (They will call you on Monday to set up appt.)    Specialty:  Gynecologic Oncology   Contact information:   [id number] [place] [id number] [phone]       The results of significant diagnostics from this hospitalization (including imaging, microbiology, ancillary and laboratory) are listed below for reference.    Significant Diagnostic Studies: Mr Anderson Malta Contrast  02/04/2014   ADDENDUM REPORT: 02/04/2014 15:57  ADDENDUM: I consulted with Dr. Conchita Paris regarding this study. 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   Electronically Signed   By: Kalman Jewels M.D.   On: 02/04/2014 15:57   02/04/2014   CLINICAL DATA:  Pelvic mass on recent CT scan.  EXAM: MRI PELVIS WITHOUT AND WITH CONTRAST  TECHNIQUE: Multiplanar  multisequence MR imaging of the pelvis was performed both before and after administration of intravenous contrast.  CONTRAST:  29mL MULTIHANCE GADOBENATE DIMEGLUMINE 529 MG/ML IV SOLN  COMPARISON:  CT scan 02/03/2014  FINDINGS: As demonstrated on the CT scan there is a large complex pelvic mass extending up into the lower abdomen. It is a bizarre appearing multi cystic and hemorrhagic lesion with fluid fluid levels throughout. Very heterogeneous irregular contrast enhancement is noted around and throughout the lesion. I suspect this is a large cystic an hemorrhagic ovarian mass. The patient has had a hysterectomy but I do not see that the ovaries were removed.  No adenopathy is identified. There is mass effect on the colon and small bowel. No inguinal mass or adenopathy. The bony structures are unremarkable. There is a small amount of free pelvic fluid.  IMPRESSION: Large multi septated cystic and hemorrhagic pelvic mass. An ovarian tumor would be most likely.  Electronically Signed: By: Kalman Jewels  M.D. On: 02/04/2014 13:25   Ct Abdomen Pelvis W Contrast  02/04/2014   CLINICAL DATA:  Urinary frequency  EXAM: CT ABDOMEN AND PELVIS WITH CONTRAST  TECHNIQUE: Multidetector CT imaging of the abdomen and pelvis was performed using the standard protocol following bolus administration of intravenous contrast.  CONTRAST:  52mL OMNIPAQUE IOHEXOL 300 MG/ML  SOLN  COMPARISON:  Prior CT from 05/08/2004  FINDINGS: A small right pleural effusion with associated right basilar atelectasis is present.  The liver demonstrates a normal contrast enhanced appearance. Small volume free perihepatic fluid is present. The gallbladder surgically absent. No biliary ductal dilatation. Spleen, adrenal glands, and pancreas are normal. Small volume free fluid noted adjacent to the spleen.  Multiple parapelvic cysts noted within the kidneys bilaterally. There is mild bilateral hydronephrosis. No hydroureter. Additional renal cyst present bilaterally. No hydronephrosis or nephrolithiasis. No focal enhancing renal mass.  There is no evidence of bowel obstruction. No abnormal wall thickening or inflammatory fat stranding seen about the bowels. Tiny fat containing paraumbilical hernia noted.  A large heterogeneous mass measuring approximately 19.4 x 13.6 x 14.3 cm is seen within the lower mid abdomen/pelvis. The uterus and ovaries are not definitely visualized. The bladder is compressed and displaced anteriorly by this collection. A few loops of bowel are seen interspersed within this collection along its right lateral aspect (series 2, image 71).  There is abnormal intermediate density fluid/soft tissues seen along the right pericolic gutter (series 2, image 48). Additionally, nodular peritoneal thickening seen along the left pericolic gutter is well (series 2, image 46). Tiny soft tissue densities seen within the omental fat of the left anterior hemi abdomen (series 2, image 39).  No free intraperitoneal air. No enlarged intra-abdominal  pelvic lymph nodes.  No acute osseous abnormality. No worrisome lytic or blastic osseous lesions.  IMPRESSION: 1. Large heterogeneous mixed attenuation mass within the lower mid pelvis as above. Finding is concerning for malignancy, with possible sarcomatous lesions and/or ovarian tumor in the differential (per EPIC, the patient is status post hysterectomy, it is unknown at this time whether ovaries were resected at that time as well). Further evaluation with MRI recommended. 2. Small volume ascites adjacent to the liver and spleen. 3. Additional fluid/soft tissue density along the pericolic gutters bilaterally and left anterior abdomen are suspicious for peritoneal implants. 4. Mild bilateral hydronephrosis, likely related to compression of the large pelvic mass. 5. Small right pleural effusion.   Electronically Signed   By: Jeannine Boga M.D.   On: 02/04/2014 00:11  Microbiology: No results found for this or any previous visit (from the past 240 hour(s)).   Labs: Basic Metabolic Panel:  Recent Labs Lab 02/03/14 1436  NA 142  K 4.3  CL 102  CO2 25  GLUCOSE 114*  BUN 21  CREATININE 0.73  CALCIUM 9.2   Liver Function Tests:  Recent Labs Lab 02/03/14 1436  AST 26  ALT 14  ALKPHOS 82  BILITOT 0.4  PROT 7.2  ALBUMIN 3.8    Recent Labs Lab 02/03/14 1436  LIPASE 14   No results found for this basename: AMMONIA,  in the last 168 hours CBC:  Recent Labs Lab 02/03/14 1436  WBC 9.8  NEUTROABS 8.5*  HGB 11.4*  HCT 33.8*  MCV 89.2  PLT 358   Cardiac Enzymes: No results found for this basename: CKTOTAL, CKMB, CKMBINDEX, TROPONINI,  in the last 168 hours BNP: BNP (last 3 results) No results found for this basename: PROBNP,  in the last 8760 hours CBG: No results found for this basename: GLUCAP,  in the last 168 hours     Signed:  Annita Brod  [facility] 02/05/2014, 8:29 AM

## 2014-02-07 NOTE — ED Provider Notes (Signed)
Medical screening examination/treatment/procedure(s) were conducted as a shared visit with non-physician practitioner(s) and myself.  I personally evaluated the patient during the encounter.   EKG Interpretation None       Mervin Kung, MD 02/07/14 [phone]

## 2014-02-08 ENCOUNTER — Telehealth: Payer: Self-pay | Admitting: *Deleted

## 2014-02-08 NOTE — Telephone Encounter (Signed)
Called pt about appt arrive at 0915 for check in gave directions and discussed pelvic exam will be performed. pt verbalized understanding. No further concerns.

## 2014-02-09 ENCOUNTER — Ambulatory Visit: Payer: Medicare Other | Attending: Gynecologic Oncology | Admitting: Gynecologic Oncology

## 2014-02-09 ENCOUNTER — Encounter: Payer: Self-pay | Admitting: Gynecologic Oncology

## 2014-02-09 VITALS — HR 90 | Temp 98.8°F | Resp 16

## 2014-02-09 DIAGNOSIS — Z801 Family history of malignant neoplasm of trachea, bronchus and lung: Secondary | ICD-10-CM | POA: Insufficient documentation

## 2014-02-09 DIAGNOSIS — Z880 Allergy status to penicillin: Secondary | ICD-10-CM | POA: Insufficient documentation

## 2014-02-09 DIAGNOSIS — J45909 Unspecified asthma, uncomplicated: Secondary | ICD-10-CM | POA: Insufficient documentation

## 2014-02-09 DIAGNOSIS — E039 Hypothyroidism, unspecified: Secondary | ICD-10-CM | POA: Insufficient documentation

## 2014-02-09 DIAGNOSIS — N1330 Unspecified hydronephrosis: Secondary | ICD-10-CM | POA: Insufficient documentation

## 2014-02-09 DIAGNOSIS — I10 Essential (primary) hypertension: Secondary | ICD-10-CM | POA: Insufficient documentation

## 2014-02-09 DIAGNOSIS — R971 Elevated cancer antigen 125 [CA 125]: Secondary | ICD-10-CM | POA: Insufficient documentation

## 2014-02-09 DIAGNOSIS — R188 Other ascites: Secondary | ICD-10-CM | POA: Insufficient documentation

## 2014-02-09 DIAGNOSIS — Z79899 Other long term (current) drug therapy: Secondary | ICD-10-CM | POA: Insufficient documentation

## 2014-02-09 DIAGNOSIS — R1900 Intra-abdominal and pelvic swelling, mass and lump, unspecified site: Secondary | ICD-10-CM

## 2014-02-09 DIAGNOSIS — J90 Pleural effusion, not elsewhere classified: Secondary | ICD-10-CM | POA: Insufficient documentation

## 2014-02-09 NOTE — Patient Instructions (Addendum)
Laparotomy, Exploratory or Staging A laparotomy is a cut (incision) into the belly (abdominal cavity). In spite of modern x-rays, laboratory, and other diagnostic studies, the belly sometimes needs to be opened. This helps the caregiver to look and learning what is wrong. The caregiver can use different incisions depending on what will give him or her the best possible look into the abdomen. REASONS FOR LAPAROTOMY  It is commonly used following accidents with injury (trauma) to the abdomen. This helps the caregiver to see if damage is present. They may also be able to repair it if necessary. Following a trauma emergency, laparotomy is often done to control massive bleeding (hemorrhage). This procedure in these circumstances may be life saving. There may not be time for the surgeon to fully discuss this situation with the family or friends, or even the patient.  Laparotomy may be used to look for causes of fever when the reason for the fever is unknown. It may also be used when an exact reason) in not known.  Laparotomy is used to stage various tumors. This means it can be used to find out how far a disease has developed. An example of this is using laparotomy to see how far Hodgkin's disease or lymphoma has developed. Seeing how far a disease has spread by staging the disease helps the caregiver to decide on the best treatment.  Exploratory laparotomy may be used in a patient with severe abdominal pain. There may not be time for further diagnositic studies and going to the operating room is the best chance for cure. The surgeon is able to evaluate all of the contents of the abdominal cavity to find out what is causing the problem and intervene. PROCEDURE   The abdomen is examined and tissue samples (biopsies) are usually taken. Biopsies are small pieces of tissue that the caregiver takes from the abdomen. They are looked at under a microscope. They are looked at by a specialist (pathologist). The  specialist helps to determine what is wrong. Biopsies are performed for staging procedures only. If your caregiver took biopsies, you should ask how you are to find out about the results. You should ask this before you leave the hospital or surgical center.  The abdomen is closed up following the laparotomy. This is usually done using staples or stitches. If staples or stitches were used to close the skin, your surgeon will let you know when you are to be seen again to removed the sutures or staples.  The length of stay in the hospital depends on the procedures performed and the reason for the operation.  Surgeons are also performing diagnostic and exploratory laparoscopic procedures in certain situations. This allows for a minimally invasive approach. Keep your appointments as directed. Document Released: 07/09/2001 Document Revised: 01/06/2012 Document Reviewed: 05/04/2008 [facility] Patient Information 2014 [place].   Laparotomy Care After Refer to this sheet in the next few weeks. These instructions provide you with information on caring for yourself after your procedure. Your caregiver may also give you more specific instructions. Your treatment has been planned according to current medical practices, but problems sometimes occur. Call your caregiver if you have any problems or questions after your procedure. HOME CARE INSTRUCTIONS ACTIVITY  Rest as much as possible the first two weeks at home.  Avoid strenuous activity such as heavy lifting (more than 10 pounds), pushing, or pulling. Limit stair climbing to once or twice a day for the first week, then slowly increase this activity.  Take frequent  rest periods throughout the day.  Talk with your caregiver about when you may resume your usual physical activity.  You need to be out of bed and walking as much as possible. This decreases the chance of:  Blood clots.  Pneumonia. NUTRITION  You can resume your normal diet once  you regain bowel function.  Drink plenty of fluids (6 8 glasses a day or as instructed by your caregiver).  Eat a well-balanced diet.  Daily portions of food from the meat (protein), milk, vegetable, and bread groups are necessary for your health. ELIMINATION It is very important not to strain during bowel movements. If constipation should occur, you may:  Take a mild laxative.  Add fruit and bran to your diet.  Drink more fluids. HYGIENE  Take showers, not baths, until 4 6 weeks after surgery.  If your incision is closed, you may take a shower or tub bath. FEVER If you feel feverish or have shaking chills, take your temperature. If it is 102 F (38.9 C), call your caregiver. The fever may mean there is an infection. PAIN CONTROL  Mild discomfort may occur.  Only take over-the-counter or prescription medicines for pain, discomfort, or fever as directed by your caregiver. Take any prescribed medicines exactly as directed. INCISION CARE  Keep your incision site clean with soap and water.  Do not use a dressing unless your cut (incision) from surgery is draining or irritated.  If you have small adhesive strips in place and they do not fall off within 10 days, carefully peel them off.  Check your incision and surrounding area daily for any redness, swelling, discoloration, heavy drainage, or separation of the skin. SEXUAL INTERCOURSE Do not have sexual intercourse until after your follow-up appointment, unless your caregiver tells you otherwise. SEEK MEDICAL CARE IF:   You are unable to tolerate food or drinks.  You are unable to pass gas or have a bowel movement.  Your pain becomes more severe or is not relieved with medicines.  You have redness, swelling, discoloration, heavy drainage, or separation of the skin at the incision site. Document Released: 05/28/2004 Document Revised: 09/30/2012 Document Reviewed: 10/13/2007 [employer] Patient Information 2014 ExitCare,  L  Preparing for your Surgery- If taking place at [facility] in [place]  Pre-operative [place] will receive a phone call from presurgical testing at [facility] to arrange for a pre-operative testing appointment before your surgery.  This appointment normally occurs one to two weeks before your scheduled surgery.   -Bring your insurance card, copy of an advanced directive if applicable, medication list  -At that visit, you will be asked to sign a consent for a possible blood transfusion in case a transfusion becomes necessary during surgery.  The need for a blood transfusion is rare but having consent is a necessary part of your care.     Day Before Surgery at [place] will be asked to take in only clear liquids the day before surgery.  Examples of clear liquids include broths, jello, and clear juices.  You will be advised to have nothing to eat or drink after midnight the evening before.    Your role in recovery Your role is to become active as soon as directed by your doctor, while still giving yourself time to heal.  Rest when you feel tired. You will be asked to do the following in order to speed your recovery:  - Cough and breathe deeply. This helps toclear and expand your lungs and  can prevent pneumonia. You may be given a spirometer to practice deep breathing. A staff member will show you how to use the spirometer. - Do mild physical activity. Walking or moving your legs help your circulation and body functions return to normal. A staff member will help you when you try to walk and will provide you with simple exercises. Do not try to get up or walk alone the first time. - Actively manage your pain. Managing your pain lets you move in comfort. We will ask you to rate your pain on a scale of zero to 10. It is your responsibility to tell your doctor or nurse where and how much you hurt so your pain can be treated.  Special Considerations -If you are diabetic, you may be  placed on insulin after surgery to have closer control over your blood sugars to promote healing and recovery.  This does not mean that you will be discharged on insulin.  If applicable, your oral antidiabetics will be resumed when you are tolerating a solid diet.  -Your final pathology results from surgery should be available by the Friday after surgery and the results will be relayed to you when available.

## 2014-02-09 NOTE — Progress Notes (Signed)
Consult Note: Gyn-Onc  [name] 68 y.o. female  CC:  Chief Complaint  Patient presents with  . Pelvic Mass    HPI: She is a 68 year old gravida 2 para 2 who is seen today in consultation at the request of Dr. Maryland Pink.  Patient 68 year old with a history of hypertension and hypothyroidism. She underwent a laparoscopic cholecystectomy in January 2015 at that revealed no evidence of tumor. She did well initially but then began experiencing the same type of pain that she had prior to her cholecystectomy. She describes the pain as in her abdomen with pressure radiating under her right shoulder blade and that has returned. The pain has not increased in significant severity until the past week and a half. She presented to the emergency room with abdominal pain on 02/03/2014 was admitted to the hospital. A CT scan of the abdomen and pelvis was performed that revealed:  A large heterogeneous mass measuring approximately 19.4 x 13.6 x 14.3 cm is seen within the lower mid abdomen/pelvis. The uterus and ovaries are not definitely visualized. The bladder is compressed and displaced anteriorly by this collection. A few loops of bowel are seen interspersed within this collection along its right lateral aspect. There is abnormal intermediate density fluid/soft tissues seen along the right pericolic gutter. Additionally, nodular peritoneal thickening seen along the left pericolic gutter is well. Tiny soft tissue densities seen within the omental fat of the left anterior hemi abdomen.  No free intraperitoneal air. No enlarged intra-abdominal pelvic lymph nodes. No acute osseous abnormality. No worrisome lytic or blastic osseous lesions.  IMPRESSION:  1. Large heterogeneous mixed attenuation mass within the lower mid pelvis as above. Finding is concerning for malignancy, with possible sarcomatous lesions and/or ovarian tumor in the differential (per EPIC, the patient is status post hysterectomy, it is unknown  at this time whether ovaries were resected at that time as well). Further evaluation with MRI recommended.  2. Small volume ascites adjacent to the liver and spleen.  3. Additional fluid/soft tissue density along the pericolic gutters bilaterally and left anterior abdomen are suspicious for peritoneal implants.  4. Mild bilateral hydronephrosis, likely related to compression of the large pelvic mass.  5. Small right pleural effusion.  She also had an MRI performed that revealed: FINDINGS:  As demonstrated on the CT scan there is a large complex pelvic mass extending up into the lower abdomen. It is a bizarre appearing multi cystic and hemorrhagic lesion with fluid fluid levels throughout.  Very heterogeneous irregular contrast enhancement is noted around and throughout the lesion. I suspect this is a large cystic an hemorrhagic ovarian mass. The patient has had a hysterectomy but I  do not see that the ovaries were removed. No adenopathy is identified. There is mass effect on the colon and small bowel. No inguinal mass or adenopathy. The bony structures are unremarkable. There is a small amount of free pelvic fluid.  IMPRESSION:  Large multi septated cystic and hemorrhagic pelvic mass. An ovarian tumor would be most likely.   The patient has a history of undergoing a vaginal hysterectomy in June of 2009. The operative note was reviewed. It does not comment on the appearance of the ovaries and the ovaries were not removed based on pathology. The patient states she believes that her ovaries have been removed and she requested this per her report at this time. She states that other than some increase in bladder frequency with voiding and constipation she's had no other constitutional symptoms.  She has a nausea vomiting. She's had a few chills but no fevers. She has a chest pain or shortness of breath. She is on pain medication taking hydrocodone twice a day as well as Dilaudid every 4 hours.  Review of  Systems: Constitutional: Denies fever. Skin: No rash, sores, jaundice, itching, or dryness.  Cardiovascular: No chest pain, shortness of breath, or edema  Pulmonary: No cough or wheeze.  Gastro Intestinal: Reporting intermittent lower abdominal soreness.  No nausea, vomiting, + constipation, or diarrhea reported. No bright red blood per rectum or change in bowel movement.  Genitourinary: + frequency, no  urgency, or dysuria.  Denies vaginal bleeding and discharge.  Musculoskeletal: No myalgia, arthralgia, joint swelling or pain.  Neurologic: No weakness, numbness, or change in gait.  Psychology: + pain   Current Meds:  Outpatient Encounter Prescriptions as of 02/09/2014  Medication Sig  . amLODipine (NORVASC) 5 MG tablet Take 5 mg by mouth daily.  Marland Kitchen HYDROmorphone (DILAUDID) 2 MG tablet Take 1 tablet (2 mg total) by mouth every 4 (four) hours as needed for severe pain.  Marland Kitchen levothyroxine (SYNTHROID, LEVOTHROID) 75 MCG tablet Take 75 mcg by mouth daily before breakfast.  . metoprolol succinate (TOPROL-XL) 50 MG 24 hr tablet Take 50 mg by mouth daily. Take with or immediately following a meal.  . OxyCODONE (OXYCONTIN) 10 mg T12A 12 hr tablet Take 1 tablet (10 mg total) by mouth every 12 (twelve) hours.  . polyethylene glycol (MIRALAX / GLYCOLAX) packet Take 17 g by mouth daily.  Marland Kitchen esomeprazole (NEXIUM) 20 MG capsule Take 20 mg by mouth as needed (for heartburn).  Marland Kitchen oxyCODONE (OXY IR/ROXICODONE) 5 MG immediate release tablet Take 1 tablet (5 mg total) by mouth every 4 (four) hours as needed for moderate pain.  . promethazine (PHENERGAN) 12.5 MG tablet Take 1 tablet (12.5 mg total) by mouth every 6 (six) hours as needed for nausea or vomiting.    Allergy:  Allergies  Allergen Reactions  . Epinephrine Anaphylaxis  . Codeine Other (See Comments)    "ill"  . Demerol [Meperidine] Other (See Comments)    "ill"  . Latex Itching    Says gloves "used" to cause itching.. Not sure today if problem  persists.  Clancy Gourd [Nitrofurantoin Macrocrystal] Other (See Comments)    "made her feel reclusive"  . Micardis [Telmisartan] Other (See Comments)    "didn't move for 6 hours"  . Protonix [Pantoprazole Sodium] Other (See Comments)    "ill"  . Sulfa Antibiotics Other (See Comments)    "blind spell"  . Zofran [Ondansetron Hcl] Other (See Comments)    "ill"    Social Hx:   History   Social History  . Marital Status: Widowed    Spouse Name: N/A    Number of Children: N/A  . Years of Education: N/A   Occupational History  . Not on file.   Social History Main Topics  . Smoking status: Never Smoker   . Smokeless tobacco: Never Used  . Alcohol Use: No  . Drug Use: No  . Sexual Activity: Not Currently   Other Topics Concern  . Not on file   Social History Narrative  . No narrative on file    Past Surgical Hx:  Past Surgical History  Procedure Laterality Date  . Tonsillectomy    . Abdominal hysterectomy    . Thyroidectomy, partial    . Cholecystectomy N/A 11/22/2013    Procedure: LAPAROSCOPIC CHOLECYSTECTOMY WITH INTRAOPERATIVE CHOLANGIOGRAM;  Surgeon: Kathryne Eriksson  Hulen Skains, MD;  Location: [facility];  Service: General;  Laterality: N/A;    Past Medical Hx:  Past Medical History  Diagnosis Date  . Thyroid disease   . Hypertension   . Anemia   . Asthma   . Hiatal hernia     Oncology Hx:   No history exists.    Family Hx:  Family History  Problem Relation Age of Onset  . Cancer Father     lung    Vitals:  Pulse 90, temperature 98.8 F (37.1 C), resp. rate 16.  Physical Exam: Well-nourished well-developed female in no acute distress.  Neck: Supple, no lymphadenopathy, no thyromegaly.  Lungs: Clear to auscultation with shallow breath sounds.  Cardiovascular: Regular rate and rhythm.  Abdomen: Well-healed laparoscopic incisions. There's a large mass that fills the pelvis and is protuberant into the upper abdomen. It measured approximately 25 cm. There is no  appreciable fluid wave. There is no tenderness.  Groins: No lymphadenopathy  Extremities: No edema  Pelvic: Normal external female genitalia. Uterus and cervix surgically absent. Bimanual reveals a large abdominal pelvic mass that feels a little lower pelvis. It does intervention is passing on the bladder. There is no nodularity. Rectal examination there is no nodularity. There is a fair amount of soft stool.  Assessment/Plan:  68 year old with a large abdominal pelvic mass and CA 125 that is slightly elevated at 67.9 and a normal CEA. I met with her and her daughter [name] today. I believe that she needs to undergo an exploratory laparotomy with BSO. She knows that the mass will be sent for frozen section. Should the frozen section be benign, we will proceed with removal of the contralateral adnexa. Should it reveal a malignancy she understands the need for comprehensive stage including omentectomy, peritoneal biopsies, and lymphadenectomy. If the peritoneal nodularity that is noted on CT is consistent with cancer, she'll undergo efforts at optimal debulking.  Risks of surgery including but not limited to bleeding, infection, injury to surrounding organs and thromboembolic disease were discussed with the patient. We discussed the need for Lovenox while in the hospital as well as for potentially in the prolonged postoperative period if she has a malignancy. She is up-to-date on both her mammogram in her colonoscopy and needs no further workup prior to surgery. She is not require cardiology risk stratification at this time. The earliest availability we have been [facility] is on April 28 with Dr. Skeet Latch. If were able to arrange a sooner operative date at [facility] next week she would like to have it done at [facility].  If not,he is very comfortable waiting until the 28. She knows that my preference is an earlier surgical day.   We can contact her at her home number which is [phone] we can call her  daughter [name] at [phone] date of surgery at [facility] is confirmed. Tandra Rosado A. Alycia Rossetti, MD 02/09/2014, 10:06 AM

## 2014-02-10 ENCOUNTER — Other Ambulatory Visit: Payer: Self-pay | Admitting: Gynecologic Oncology

## 2014-02-10 DIAGNOSIS — R1900 Intra-abdominal and pelvic swelling, mass and lump, unspecified site: Secondary | ICD-10-CM

## 2014-02-10 MED ORDER — HYDROMORPHONE HCL 2 MG PO TABS: 2.0000 mg | ORAL_TABLET | ORAL | Status: DC | PRN

## 2014-02-10 NOTE — Progress Notes (Signed)
Patient's daughter, Leana Roe, called this am stating that her mother would be running out of medication for severe abdominal pain before she is seen at [facility] for surgery.  Stating her mother is taking the oxycontin every 12 hours with the dilaudid 2 mg tablets every four hours as needed for severe pain.  She was given a prescription for oxycodone IR as well when she was discharged but stating she never had it filled.  Reporting moderate to severe abdominal pain constantly with the pain medication assisting with allowing her to perform activities of daily living with minimal to moderate pain.  Advised she would be given enough to last her to her surgery at [facility] on 02/15/14.  She is to pick up the prescription for the mother this afternoon.  No driving while taking this medication.  Maps to [facility] also provided with the prescription along with handouts of the medication.  Advised to call for any questions or concerns.

## 2014-02-22 ENCOUNTER — Ambulatory Visit (INDEPENDENT_AMBULATORY_CARE_PROVIDER_SITE_OTHER): Payer: Medicare Other | Admitting: General Surgery

## 2014-02-24 ENCOUNTER — Telehealth: Payer: Self-pay | Admitting: Oncology

## 2014-02-24 ENCOUNTER — Telehealth: Payer: Self-pay | Admitting: *Deleted

## 2014-02-24 ENCOUNTER — Encounter (INDEPENDENT_AMBULATORY_CARE_PROVIDER_SITE_OTHER): Payer: Self-pay | Admitting: General Surgery

## 2014-02-24 DIAGNOSIS — C569 Malignant neoplasm of unspecified ovary: Secondary | ICD-10-CM

## 2014-02-24 DIAGNOSIS — I10 Essential (primary) hypertension: Secondary | ICD-10-CM | POA: Insufficient documentation

## 2014-02-24 NOTE — Telephone Encounter (Signed)
Call from Dr. Alycia Rossetti, pt with Ovarian Cancer Surgery at [facility], Referral to [place]- Dr. Marko Plume, pt to start on Taxol/Carbo asap. Referral taken to [facility] for appt.

## 2014-02-24 NOTE — Telephone Encounter (Signed)
C/D 02/24/14 for appt. 03/07/14

## 2014-02-25 ENCOUNTER — Telehealth: Payer: Self-pay | Admitting: *Deleted

## 2014-02-25 DIAGNOSIS — C801 Malignant (primary) neoplasm, unspecified: Secondary | ICD-10-CM

## 2014-02-25 HISTORY — DX: Malignant (primary) neoplasm, unspecified: C80.1

## 2014-02-25 NOTE — Telephone Encounter (Signed)
Called pt and confirmed all upcoming appts. Pt verbalized understanding.

## 2014-03-04 ENCOUNTER — Encounter: Payer: Self-pay | Admitting: Oncology

## 2014-03-04 ENCOUNTER — Ambulatory Visit: Payer: Medicare Other

## 2014-03-04 DIAGNOSIS — R1900 Intra-abdominal and pelvic swelling, mass and lump, unspecified site: Secondary | ICD-10-CM

## 2014-03-04 NOTE — Progress Notes (Signed)
No date for treatment as of today. °

## 2014-03-06 ENCOUNTER — Other Ambulatory Visit: Payer: Self-pay | Admitting: Oncology

## 2014-03-06 DIAGNOSIS — C569 Malignant neoplasm of unspecified ovary: Secondary | ICD-10-CM

## 2014-03-07 ENCOUNTER — Other Ambulatory Visit: Payer: Self-pay | Admitting: Oncology

## 2014-03-07 ENCOUNTER — Ambulatory Visit: Payer: Medicare Other

## 2014-03-07 ENCOUNTER — Encounter: Payer: Self-pay | Admitting: Oncology

## 2014-03-07 ENCOUNTER — Telehealth: Payer: Self-pay | Admitting: Oncology

## 2014-03-07 ENCOUNTER — Ambulatory Visit (HOSPITAL_BASED_OUTPATIENT_CLINIC_OR_DEPARTMENT_OTHER): Payer: Medicare Other | Admitting: Oncology

## 2014-03-07 ENCOUNTER — Other Ambulatory Visit (HOSPITAL_BASED_OUTPATIENT_CLINIC_OR_DEPARTMENT_OTHER): Payer: Medicare Other

## 2014-03-07 VITALS — BP 122/66 | HR 78 | Temp 98.4°F | Resp 18 | Ht 67.0 in | Wt 190.1 lb

## 2014-03-07 DIAGNOSIS — C569 Malignant neoplasm of unspecified ovary: Secondary | ICD-10-CM

## 2014-03-07 DIAGNOSIS — I10 Essential (primary) hypertension: Secondary | ICD-10-CM

## 2014-03-07 DIAGNOSIS — D500 Iron deficiency anemia secondary to blood loss (chronic): Secondary | ICD-10-CM

## 2014-03-07 LAB — CBC WITH DIFFERENTIAL/PLATELET
BASO%: 1.1 % (ref 0.0–2.0)
Basophils Absolute: 0 10*3/uL (ref 0.0–0.1)
EOS ABS: 0.1 10*3/uL (ref 0.0–0.5)
EOS%: 2.6 % (ref 0.0–7.0)
HEMATOCRIT: 29.4 % — AB (ref 34.8–46.6)
HGB: 9.6 g/dL — ABNORMAL LOW (ref 11.6–15.9)
LYMPH%: 19.6 % (ref 14.0–49.7)
MCH: 28.6 pg (ref 25.1–34.0)
MCHC: 32.7 g/dL (ref 31.5–36.0)
MCV: 87.7 fL (ref 79.5–101.0)
MONO#: 0.4 10*3/uL (ref 0.1–0.9)
MONO%: 9.3 % (ref 0.0–14.0)
NEUT#: 3 10*3/uL (ref 1.5–6.5)
NEUT%: 67.4 % (ref 38.4–76.8)
Platelets: 281 10*3/uL (ref 145–400)
RBC: 3.35 10*6/uL — ABNORMAL LOW (ref 3.70–5.45)
RDW: 14.9 % — ABNORMAL HIGH (ref 11.2–14.5)
WBC: 4.5 10*3/uL (ref 3.9–10.3)
lymph#: 0.9 10*3/uL (ref 0.9–3.3)

## 2014-03-07 LAB — COMPREHENSIVE METABOLIC PANEL (CC13)
ALK PHOS: 68 U/L (ref 40–150)
ALT: 18 U/L (ref 0–55)
AST: 23 U/L (ref 5–34)
Albumin: 3.5 g/dL (ref 3.5–5.0)
Anion Gap: 10 mEq/L (ref 3–11)
BILIRUBIN TOTAL: 0.29 mg/dL (ref 0.20–1.20)
BUN: 14.8 mg/dL (ref 7.0–26.0)
CO2: 27 mEq/L (ref 22–29)
Calcium: 9.5 mg/dL (ref 8.4–10.4)
Chloride: 107 mEq/L (ref 98–109)
Creatinine: 0.8 mg/dL (ref 0.6–1.1)
Glucose: 116 mg/dl (ref 70–140)
Potassium: 3.8 mEq/L (ref 3.5–5.1)
Sodium: 144 mEq/L (ref 136–145)
Total Protein: 6.5 g/dL (ref 6.4–8.3)

## 2014-03-07 NOTE — Progress Notes (Signed)
[facility] NEW PATIENT EVALUATION   Name: [name] Date: 03/07/2014 MRN: [id number] DOB: [birth year]  REFERRING PHYSICIAN: Nancy Marus CC: Gareth Eagle (PCP), Laurence Spates, Judeth Horn, Lawerance Bach   REASON FOR REFERRAL: recent diagnosis of ovarian carcinosarcoma, for consideration of chemotherapy.   HISTORY OF PRESENT ILLNESS:[name] is a 68 y.o. female who is seen in consultation at the request of Dr Nancy Marus, with recent diagnosis of IIIC ovarian carcinosarcoma, for which she is post optimal radical debulking at [facility] 02-15-2014.  Information for this consultation is from [facility] records reviewed by MD  (DC summary, operative note, pathology and multidisciplinary conference), this EMR and from patient.   Patient had been in usual generally good health, status post remote hysterectomy,  until abdominal pain with radiation to right shoulder in Jan 2015. She was admitted thru ED 11-21-13 with [place] evidence of cholelithiasis, tho no signs of cholecystitis then. (Note patient tells me that she had CT in Jan, however this EMR has only abdominal US then). She was taken to laparoscopic cholecystectomy by Dr Hulen Skains on 11-22-13, with multiple large stones in specimen and benign pathology.  Her post operative course was not remarkable and the pain initially resolved entirely, with patient feeling well at post op visit in mid Feb. By ~March she had more abdominal cramping and dull pain, with abdominal distension and was treated with cipro for apparent UTI. She was seen in ED on 02-03-14 with concern for recurrent UTI, with abdominal symptoms as above + intermittent nausea and vomiting, with abdomen tender on exam then. CT AP found large heterogeneous mass lower mid abdomen/ pelvis 19.4 x 13.6 x 14.3 cm with soft tissue along pericolic gutters and left anterior abdomen, with mild bilateral hydronephrosis and small right pleural effusion. CA 125 resulted at 67.9 and CEA was 1.0. She saw Dr  Alycia Rossetti on 02-09-14 and was hospitalized at [facility] from 02-11-14 thru 02-25-14. She went to exploratory laparotomy on 02-15-14, with findings of 20 cm mass filling pelvis and involving rectosigmoid and bilateral ovaries and tubes, with extension of tumor deep in pelvis and along rectum. Surgery was BSO/omentectomy and rectosigmoid resection with reanastomosis was performed, then diverting loop colostomy due to intraoperative concerns about the proximal rectal stump. She had bilateral ureterolysis for retroperitoneal fibrosis. At completion of procedure, which was optimal debulking, there were scattered <1 cm nodules on peritoneum, small bowel and small bowel mesentery, with no appreciable upper abdominal disease. She was transfused 2 units PRBCs intraoperatively. [facility] [phone] from 02-15-2014) found high grade carcinosarcoma 17 cm originating in one ovary which was adherent to multiple structures, metastatic disease in 1.8 cm peritoneal nodule as well as sigmoid colon nodule 1.5 cm and in omentum, no lymph nodes submitted, no identified LVSI, margin of one end of colon positive, The colon specimen also had adenomatous polyp without high grade dysplasia. She was hospitalized at [facility] from 02-11-14 thru 02-25-14. Preoperatively she had acute kidney injury, with increase in creatinine from baseline 0.7 to high of 2.17; creatinine postoperatively was 1.52. She needed NG following surgery. PAC was placed at [facility] on 02-21-14. Recommendations from [facility] multidisciplinary conference 02-23-2014 was for chemotherapy with platinum and taxane. She is to see Dr Alycia Rossetti in [place] on 03-14-14.  Patient continues prophylactic dose lovenox, planned x 3 weeks. She has had no bleeding. Appetite is still poor, tho she is tolerating egg, toast and chocolate ensure. Phenergan has been helpful for nausea since DC. Ostomy is funcitoning and she  denies bladder symptoms. She is using oxycontin 10 mg q 12 hrs, does have oxyIR  available.   REVIEW OF SYSTEMS as above, also: No fever. Lost ~ 10 lbs with surgery. No vomiting. No GERD. Slight swelling in ankles, no pain LE.No HA. Wears corrective lenses. Some environmental allergies. Due for dental cleaning, no known active dental problems. No difficulty hearing. No problems noted with thyroid supplement. Asthma in childhood only. No arthritis. She believes that she had nausea in hospital from zofran, which we have discussed, as seems likely that nausea was coincidental rather than related to the zofran.  Remainder of full 10 point review of systems negative.   ALLERGIES: Epinephrine; Codeine; Demerol; Latex; Macrodantin; Micardis; Protonix; Sulfa antibiotics; and Zofran See above re zofran, as it does not seem likely that reported nausea was side effect of the zofran.  PAST MEDICAL/ SURGICAL HISTORY:    G2P2 Hypothyroid, post partial thyroidectomy 2000 HTN Tonsillectomy in childhood Laparoscopic cholecystectomy  10-2013 Vaginal hysterectomy June 2009  BSO omentectomy, rectosigmoid resection and diverting colostomy [facility] 02-15-2014. Colonoscopy by Dr Leonie Douglas, due again in 5 years Mammograms at [facility] 09-24-2013  CURRENT MEDICATIONS: reviewed as listed now in [place]: from [place], now lives in [place]. Widowed last year. 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 Son age 34 and daughter age 59 both in [place]. 3 grands ages 59 and two 61 year olds (not twins). Never smoker, no ETOH   FAMILY HISTORY:   Father with lung cancer, was smoker No other cancer in family Son had benign bowel obstruction, otherwise both children healthy         PHYSICAL EXAM:  height is 5\' 7"  (1.702 m) and weight is 190 lb 1.6 oz (86.229 kg). Her oral temperature is 98.4 F (36.9 C). Her blood pressure is 122/66 and her pulse is 78. Her respiration is 18.  Alert, pleasant, cooperative lady looks stated  age, moving slowly but NAD.  HEENT:PERRL, not icteric. Oral mucosa moist and clear, no obvious dental probems. Neck supple without JVD or thyroid mass.  RESPIRATORY:lungs clear to auscultation and percussion, no wheezes or rales. Respirations not labored RA.  CARDIAC/ VASCULAR: heart RRR, no gallop, clear heart sounds. Peripheral pulses symmetrical  ABDOMEN: soft, not obviously distended, few bowel sounds. Surgical incision closed, appears to be healing appropriately. Colostomy on left. Cannot appreciate mass or organomegaly  LYMPH NODES: no cervical, supraclavicular, axillary or inguinal adenopathy  BREASTS:bilaterally without dominant mass, skin or nipple findings of concern. Axillae benign  NEUROLOGIC: CN, motor, sensory, cerebellar without focal deficits  SKIN: witout rash, ecchymosis, petechiae  MUSCULOSKELETAL: 1+ pedal edema without cords or tenderness. Spine not tender  Portacath site not remarkable    LABORATORY DATA:  Results for orders placed in visit on 03/07/14 (from the past 48 hour(s))  COMPREHENSIVE METABOLIC PANEL (0000000)     Status: None   Collection Time    03/07/14  2:20 PM      Result Value Ref Range   Sodium 144  136 - 145 mEq/L   Potassium 3.8  3.5 - 5.1 mEq/L   Chloride 107  98 - 109 mEq/L   CO2 27  22 - 29 mEq/L   Glucose 116  70 - 140 mg/dl   BUN 14.8  7.0 - 26.0 mg/dL   Creatinine 0.8  0.6 - 1.1 mg/dL   Total Bilirubin 0.29  0.20 - 1.20 mg/dL   Alkaline Phosphatase 68  40 - 150 U/L   AST 23  5 - 34 U/L   ALT 18  0 - 55 U/L   Total Protein 6.5  6.4 - 8.3 g/dL   Albumin 3.5  3.5 - 5.0 g/dL   Calcium 9.5  8.4 - 10.4 mg/dL   Anion Gap 10  3 - 11 mEq/L  CBC WITH DIFFERENTIAL     Status: Abnormal   Collection Time    03/07/14  2:20 PM      Result Value Ref Range   WBC 4.5  3.9 - 10.3 10e3/uL   NEUT# 3.0  1.5 - 6.5 10e3/uL   HGB 9.6 (*) 11.6 - 15.9 g/dL   HCT 29.4 (*) 34.8 - 46.6 %   Platelets 281  145 - 400 10e3/uL   MCV 87.7  79.5 - 101.0 fL    MCH 28.6  25.1 - 34.0 pg   MCHC 32.7  31.5 - 36.0 g/dL   RBC 3.35 (*) 3.70 - 5.45 10e6/uL   RDW 14.9 (*) 11.2 - 14.5 %   lymph# 0.9  0.9 - 3.3 10e3/uL   MONO# 0.4  0.1 - 0.9 10e3/uL   Eosinophils Absolute 0.1  0.0 - 0.5 10e3/uL   Basophils Absolute 0.0  0.0 - 0.1 10e3/uL   NEUT% 67.4  38.4 - 76.8 %   LYMPH% 19.6  14.0 - 49.7 %   MONO% 9.3  0.0 - 14.0 %   EOS% 2.6  0.0 - 7.0 %   BASO% 1.1  0.0 - 2.0 %      PATHOLOGY: From [facility] as above, will be scanned into this EMR  RADIOGRAPHY: ULTRASOUND ABDOMEN COMPLETE   11-21-2013  COMPARISON: Renal ultrasound 09/08/2008. Abdominal ultrasound  01/31/2006. CT 05/08/2004.  FINDINGS:  Gallbladder:  Gallstones. Gallbladder wall thickness 2.7 mm. Negative Murphy sign.  No pericholecystic fluid collection.  Common bile duct:  Diameter: 4.5 mm.  Liver:  Coarse dense echotexture. This suggests hepatocellular disease.  Fatty infiltration cannot be excluded.  IVC:  No abnormality visualized.  Pancreas:  Echogenic. This could be seen with chronic pancreatitis and fatty  infiltration.  Spleen:  Size and appearance within normal limits.  Right Kidney:  Length: 12.3 cm. Lower pole 2.6 x 2.5 x 2.2 cm simple cyst.  Parapelvic cysts. Cortical thinning consistent with chronic renal  disease. No hydronephrosis.  Left Kidney:  Length: 13.3 cm. Cortical thinning consistent with chronic renal  disease. Parapelvic cysts. No hydronephrosis.  Abdominal aorta:  No aneurysm visualized.  Other findings:  None.  IMPRESSION:  1. Gallstones. Gallbladder wall thickness normal. Negative Murphy  sign. No pericholecystic fluid collection .  2. Liver has a coarse dense echotexture. This suggests  hepatocellular disease and/or fatty infiltration.  3. Echogenic pancreas. This could be seen with chronic pancreatitis  and/or fatty infiltration.  4. Bilateral renal parapelvic cysts. Simple cyst noted on lower pole  right kidney. No hydronephrosis.   CT  ABDOMEN AND PELVIS WITH CONTRAST    02-04-2014  COMPARISON: Prior CT from 05/08/2004  FINDINGS:  A small right pleural effusion with associated right basilar  atelectasis is present.  The liver demonstrates a normal contrast enhanced appearance. Small  volume free perihepatic fluid is present. The gallbladder surgically  absent. No biliary ductal dilatation. Spleen, adrenal glands, and  pancreas are normal. Small volume free fluid noted adjacent to the  spleen.  Multiple parapelvic cysts noted within the kidneys bilaterally.  There is mild bilateral hydronephrosis. No hydroureter. Additional  renal cyst present  bilaterally. No hydronephrosis or  nephrolithiasis. No focal enhancing renal mass.  There is no evidence of bowel obstruction. No abnormal wall  thickening or inflammatory fat stranding seen about the bowels. Tiny  fat containing paraumbilical hernia noted.  A large heterogeneous mass measuring approximately 19.4 x 13.6 x  14.3 cm is seen within the lower mid abdomen/pelvis. The uterus and  ovaries are not definitely visualized. The bladder is compressed and  displaced anteriorly by this collection. A few loops of bowel are  seen interspersed within this collection along its right lateral  aspect (series 2, image 71).  There is abnormal intermediate density fluid/soft tissues seen along  the right pericolic gutter (series 2, image 48). Additionally,  nodular peritoneal thickening seen along the left pericolic gutter  is well (series 2, image 46). Tiny soft tissue densities seen within  the omental fat of the left anterior hemi abdomen (series 2, image  39).  No free intraperitoneal air. No enlarged intra-abdominal pelvic  lymph nodes.  No acute osseous abnormality. No worrisome lytic or blastic osseous  lesions.  IMPRESSION:  1. Large heterogeneous mixed attenuation mass within the lower mid  pelvis as above. Finding is concerning for malignancy, with possible  sarcomatous  lesions and/or ovarian tumor in the differential (per  EPIC, the patient is status post hysterectomy, it is unknown at this  time whether ovaries were resected at that time as well). Further  evaluation with MRI recommended.  2. Small volume ascites adjacent to the liver and spleen.  3. Additional fluid/soft tissue density along the pericolic gutters  bilaterally and left anterior abdomen are suspicious for peritoneal  implants.  4. Mild bilateral hydronephrosis, likely related to compression of  the large pelvic mass.  5. Small right pleural effusion.    DISCUSSION: we have discussed circumstances surrounding diagnosis, surgical procedure and findings, and recommendation for additional treatment with taxol and carboplatin. She was comfortable with information given in chemotherapy education class; we have reviewed outpatient chemotherapy administration, possible side effects and use of antiemetics and steroids. Other than refill on phenergan, have not decided on antiemetics (see zofran above). I will see her back next week and will set up first chemotherapy from there if she continues to improve from surgery. All questions were answered to her satisfaction now.     IMPRESSION / PLAN:   1.IIIC carcinosarcoma of ovary: post optimal debulking including rectosigmoid resection and diverting loop colostomy at [facility] 02-15-14. I will reassess next week and set up first chemotherapy from there.  Following visit, I realized that next appointment with gyn oncology is June 18 rather than May 18. Will discuss with Dr Alycia Rossetti if concerns when I see patient myself next week.  2.post laparoscopic cholecystectomy Jan 2015 3.PAC placed at [facility]. Will need EMLA prescription 4.hypothyroid on replacement, post partial thyroidectomy 5.HTN 6.acute renal insufficiency around recent surgery, resolved. Creatinine 0.8 today. 7.history from patient of nausea from zofran. I have told her that this would certainly not be  expected and that zofran is often very helpful with chemotherapy nausea. Will need to discuss further at next visit. 8. Anemia: related to blood loss at recent surgery, did receive 2 units PRBCs intraoperatively. Will follow, may need iron supplementation.  Patient and accompanying individual have had questions answered to their satisfaction and are in agreement with plan above. She can contact this office for questions or concerns at any time prior to next scheduled visit.  Time spent 60+ min, including >50% discussion and coordination  of care.    Gordy Levan, MD 03/07/2014 9:29 PM

## 2014-03-07 NOTE — Telephone Encounter (Signed)
per pof to sch pt appt-gave pt copy of sch °

## 2014-03-07 NOTE — Progress Notes (Signed)
Checked in new pt with no financial concerns. °

## 2014-03-08 LAB — CA 125: CA 125: 41.4 U/mL — ABNORMAL HIGH (ref 0.0–30.2)

## 2014-03-09 ENCOUNTER — Other Ambulatory Visit: Payer: Self-pay

## 2014-03-09 DIAGNOSIS — C569 Malignant neoplasm of unspecified ovary: Secondary | ICD-10-CM

## 2014-03-09 MED ORDER — PROMETHAZINE HCL 25 MG PO TABS: 12.5000 mg | ORAL_TABLET | Freq: Four times a day (QID) | ORAL | Status: DC | PRN

## 2014-03-10 ENCOUNTER — Ambulatory Visit: Payer: Medicare Other | Admitting: Gynecologic Oncology

## 2014-03-13 ENCOUNTER — Other Ambulatory Visit: Payer: Self-pay | Admitting: Oncology

## 2014-03-13 DIAGNOSIS — C569 Malignant neoplasm of unspecified ovary: Secondary | ICD-10-CM

## 2014-03-14 ENCOUNTER — Encounter: Payer: Self-pay | Admitting: *Deleted

## 2014-03-14 ENCOUNTER — Other Ambulatory Visit: Payer: Self-pay | Admitting: *Deleted

## 2014-03-14 ENCOUNTER — Telehealth: Payer: Self-pay | Admitting: *Deleted

## 2014-03-14 MED ORDER — ONDANSETRON HCL 8 MG PO TABS: 8.0000 mg | ORAL_TABLET | Freq: Three times a day (TID) | ORAL | Status: DC | PRN

## 2014-03-14 MED ORDER — LIDOCAINE-PRILOCAINE 2.5-2.5 % EX CREA: 1.0000 "application " | TOPICAL_CREAM | CUTANEOUS | Status: DC | PRN

## 2014-03-14 NOTE — Telephone Encounter (Signed)
Message copied by Patton Salles on Mon Mar 14, 2014  3:58 PM ------      Message from: Gordy Levan      Created: Sun Mar 13, 2014  3:28 PM       1)  I expect to start first long taxol carbo week of 5-25. With holiday that week, please see what spaces are available and which of these suits patient, then put her on schedule for Rx + lab whatever spot best.      2)  Patient reported nausea from zofran while at [facility]. As I told her at visit last week, probably this was coincidental, as she had a lot of reasons to have nausea during that hospitalization. I have reviewed all of reported zofran side effects, and nausea is NOT even a rare side effect. If she agrees, please send in prescription for zofran/ ondansetron 8 mg (just one tablet if she prefers, or can do #30 one every 8 hours prn nausea) and instruct her to take one tablet when she is NOT nauseated between now and when I see her on 5-21, so that we can be sure she tolerates this. Explain that zofran is frequently a very helpful antiemetic with chemo, so I would like to have that as an option for her if possible.      3) script for EMLA            thanks ------

## 2014-03-14 NOTE — Telephone Encounter (Signed)
Message to [place] to schedule chemo for 5/29. Spoke with patient about zofran, she is not willing to try zofran again. States she tried it twice and does not want to try it again. Is willing to try 1 tablet before she comes in on 03/17/14.

## 2014-03-15 ENCOUNTER — Other Ambulatory Visit: Payer: Self-pay | Admitting: *Deleted

## 2014-03-15 ENCOUNTER — Telehealth: Payer: Self-pay | Admitting: *Deleted

## 2014-03-15 NOTE — Telephone Encounter (Signed)
Message copied by Patton Salles on Tue Mar 15, 2014  9:23 AM ------      Message from: Gordy Levan      Created: Sun Mar 13, 2014  3:28 PM       1)  I expect to start first long taxol carbo week of 5-25. With holiday that week, please see what spaces are available and which of these suits patient, then put her on schedule for Rx + lab whatever spot best.      2)  Patient reported nausea from zofran while at [facility]. As I told her at visit last week, probably this was coincidental, as she had a lot of reasons to have nausea during that hospitalization. I have reviewed all of reported zofran side effects, and nausea is NOT even a rare side effect. If she agrees, please send in prescription for zofran/ ondansetron 8 mg (just one tablet if she prefers, or can do #30 one every 8 hours prn nausea) and instruct her to take one tablet when she is NOT nauseated between now and when I see her on 5-21, so that we can be sure she tolerates this. Explain that zofran is frequently a very helpful antiemetic with chemo, so I would like to have that as an option for her if possible.      3) script for EMLA            thanks ------

## 2014-03-15 NOTE — Telephone Encounter (Signed)
Per desk RN I have scheduled appt 

## 2014-03-15 NOTE — Telephone Encounter (Signed)
Confirmed appointments for lab and chemo on 5/29

## 2014-03-17 ENCOUNTER — Ambulatory Visit (HOSPITAL_BASED_OUTPATIENT_CLINIC_OR_DEPARTMENT_OTHER): Payer: Medicare Other | Admitting: Oncology

## 2014-03-17 ENCOUNTER — Other Ambulatory Visit (HOSPITAL_BASED_OUTPATIENT_CLINIC_OR_DEPARTMENT_OTHER): Payer: Medicare Other

## 2014-03-17 ENCOUNTER — Encounter: Payer: Self-pay | Admitting: Oncology

## 2014-03-17 VITALS — BP 127/76 | HR 79 | Temp 98.0°F | Resp 19 | Ht 67.0 in | Wt 183.3 lb

## 2014-03-17 DIAGNOSIS — R509 Fever, unspecified: Secondary | ICD-10-CM

## 2014-03-17 DIAGNOSIS — D62 Acute posthemorrhagic anemia: Secondary | ICD-10-CM

## 2014-03-17 DIAGNOSIS — I10 Essential (primary) hypertension: Secondary | ICD-10-CM

## 2014-03-17 DIAGNOSIS — N390 Urinary tract infection, site not specified: Secondary | ICD-10-CM

## 2014-03-17 DIAGNOSIS — Z933 Colostomy status: Secondary | ICD-10-CM

## 2014-03-17 DIAGNOSIS — E039 Hypothyroidism, unspecified: Secondary | ICD-10-CM

## 2014-03-17 DIAGNOSIS — C569 Malignant neoplasm of unspecified ovary: Secondary | ICD-10-CM

## 2014-03-17 LAB — URINALYSIS, MICROSCOPIC - CHCC
BILIRUBIN (URINE): NEGATIVE
Blood: NEGATIVE
Glucose: NEGATIVE mg/dL
KETONES: NEGATIVE mg/dL
Nitrite: NEGATIVE
Specific Gravity, Urine: 1.03 (ref 1.003–1.035)
Urobilinogen, UR: 0.2 mg/dL (ref 0.2–1)
pH: 6 (ref 4.6–8.0)

## 2014-03-17 LAB — CBC WITH DIFFERENTIAL/PLATELET
BASO%: 0.8 % (ref 0.0–2.0)
Basophils Absolute: 0 10*3/uL (ref 0.0–0.1)
EOS%: 1.5 % (ref 0.0–7.0)
Eosinophils Absolute: 0.1 10*3/uL (ref 0.0–0.5)
HCT: 34.5 % — ABNORMAL LOW (ref 34.8–46.6)
HGB: 11 g/dL — ABNORMAL LOW (ref 11.6–15.9)
LYMPH%: 18.7 % (ref 14.0–49.7)
MCH: 28.4 pg (ref 25.1–34.0)
MCHC: 32 g/dL (ref 31.5–36.0)
MCV: 88.8 fL (ref 79.5–101.0)
MONO#: 0.5 10*3/uL (ref 0.1–0.9)
MONO%: 10.5 % (ref 0.0–14.0)
NEUT#: 3.5 10*3/uL (ref 1.5–6.5)
NEUT%: 68.5 % (ref 38.4–76.8)
PLATELETS: 283 10*3/uL (ref 145–400)
RBC: 3.88 10*6/uL (ref 3.70–5.45)
RDW: 14.9 % — ABNORMAL HIGH (ref 11.2–14.5)
WBC: 5.1 10*3/uL (ref 3.9–10.3)
lymph#: 0.9 10*3/uL (ref 0.9–3.3)

## 2014-03-17 NOTE — Progress Notes (Signed)
OFFICE PROGRESS NOTE   03/17/2014   Physicians:Paola Gehrig  CC: Gareth Eagle (PCP), Laurence Spates, Judeth Horn, Lawerance Bach   INTERVAL HISTORY:   Patient is seen, together with granddaughter, in continuing attention to recently diagnosed IIIC ovarian carcinosarcoma for which we plan adjuvant chemotherapy beginning next week. She had optimal radical debulking at [facility] 02-15-2014 and will see Dr Alycia Rossetti on 04-14-14. First q 3 week taxol carboplatin chemo is scheduled for 03-25-14. Patient reports low grade temperature last 2 days to 99, and chills last pm. She may have slight bladder symptoms, no other localizing symptoms of infection. She stopped oxycontin last pm, has used prn dilaudid x2 today.   She has PAC.  ONCOLOGIC HISTORY Patient had been in usual generally good health, status post remote hysterectomy, until abdominal pain with radiation to right shoulder in Jan 2015. She was admitted thru ED 11-21-13 with [place] evidence of cholelithiasis, tho no signs of cholecystitis then. (Note patient tells me that she had CT in Jan, however this EMR has only abdominal US then). She was taken to laparoscopic cholecystectomy by Dr Hulen Skains on 11-22-13, with multiple large stones in specimen and benign pathology. Her post operative course was not remarkable and the pain initially resolved entirely, with patient feeling well at post op visit in mid Feb. By ~March she had more abdominal cramping and dull pain, with abdominal distension and was treated with cipro for apparent UTI. She was seen in ED on 02-03-14 with concern for recurrent UTI, with abdominal symptoms as above + intermittent nausea and vomiting, with abdomen tender on exam then. CT AP found large heterogeneous mass lower mid abdomen/ pelvis 19.4 x 13.6 x 14.3 cm with soft tissue along pericolic gutters and left anterior abdomen, with mild bilateral hydronephrosis and small right pleural effusion. CA 125 resulted at 67.9 and CEA was 1.0. She saw Dr Alycia Rossetti on  02-09-14 and was hospitalized at [facility] from 02-11-14 thru 02-25-14. She went to exploratory laparotomy on 02-15-14, with findings of 20 cm mass filling pelvis and involving rectosigmoid and bilateral ovaries and tubes, with extension of tumor deep in pelvis and along rectum. Surgery was BSO/omentectomy and rectosigmoid resection with reanastomosis was performed, then diverting loop colostomy due to intraoperative concerns about the proximal rectal stump. She had bilateral ureterolysis for retroperitoneal fibrosis. At completion of procedure, which was optimal debulking, there were scattered <1 cm nodules on peritoneum, small bowel and small bowel mesentery, with no appreciable upper abdominal disease. She was transfused 2 units PRBCs intraoperatively. [facility] [phone] from 02-15-2014) found high grade carcinosarcoma 17 cm originating in one ovary which was adherent to multiple structures, metastatic disease in 1.8 cm peritoneal nodule as well as sigmoid colon nodule 1.5 cm and in omentum, no lymph nodes submitted, no identified LVSI, margin of one end of colon positive, The colon specimen also had adenomatous polyp without high grade dysplasia.  She was hospitalized at [facility] from 02-11-14 thru 02-25-14. Preoperatively she had acute kidney injury, with increase in creatinine from baseline 0.7 to high of 2.17; creatinine postoperatively was 1.52. She needed NG following surgery.  PAC was placed at [facility] on 02-21-14.  Recommendations from [facility] multidisciplinary conference 02-23-2014 was for chemotherapy with platinum and taxane.  Review of systems as above, also: No respiratory symptoms. Some constipation. No significant LE swelling. No problems with PAC. She is not eating well, is drinking water. Remainder of 10 point Review of Systems negative.  Objective:  Vital signs in last 24 hours:  BP  127/76  Pulse 79  Temp(Src) 98 F (36.7 C) (Oral)  Resp 19  Ht 5\' 7"  (1.702 m)  Wt 183 lb 4.8 oz (83.144 kg)  BMI  28.70 kg/m2  SpO2 98% Weight down 7 lbs. Alert, oriented and appropriate. Ambulatory without assistance.  No alopecia  HEENT:PERRL, sclerae not icteric. Oral mucosa moist without lesions, posterior pharynx clear.  Neck supple. No JVD.  Lymphatics:no cervical,suraclavicular, axillary or inguinal adenopathy Resp: clear to auscultation bilaterally and normal percussion bilaterally Cardio: regular rate and rhythm. No gallop. GI: soft, nontender, not distended, no mass or organomegaly. Decreased bowel sounds. Surgical incision not remarkable. Musculoskeletal/ Extremities: without pitting edema, cords, tenderness Neuro: no peripheral neuropathy. Otherwise nonfocal. PSYCH mood and affect appropriate Skin without rash, ecchymosis, petechiae Portacath-without erythema or tenderness  Lab Results:  Results for orders placed in visit on 03/17/14  CBC WITH DIFFERENTIAL      Result Value Ref Range   WBC 5.1  3.9 - 10.3 10e3/uL   NEUT# 3.5  1.5 - 6.5 10e3/uL   HGB 11.0 (*) 11.6 - 15.9 g/dL   HCT 34.5 (*) 34.8 - 46.6 %   Platelets 283  145 - 400 10e3/uL   MCV 88.8  79.5 - 101.0 fL   MCH 28.4  25.1 - 34.0 pg   MCHC 32.0  31.5 - 36.0 g/dL   RBC 3.88  3.70 - 5.45 10e6/uL   RDW 14.9 (*) 11.2 - 14.5 %   lymph# 0.9  0.9 - 3.3 10e3/uL   MONO# 0.5  0.1 - 0.9 10e3/uL   Eosinophils Absolute 0.1  0.0 - 0.5 10e3/uL   Basophils Absolute 0.0  0.0 - 0.1 10e3/uL   NEUT% 68.5  38.4 - 76.8 %   LYMPH% 18.7  14.0 - 49.7 %   MONO% 10.5  0.0 - 14.0 %   EOS% 1.5  0.0 - 7.0 %   BASO% 0.8  0.0 - 2.0 %  URINALYSIS, MICROSCOPIC - [facility]      Result Value Ref Range   Glucose Negative  Negative mg/dL   Bilirubin (Urine) Negative  Negative   Ketones Negative  Negative mg/dL   Specific Gravity, Urine 1.030  1.003 - 1.035   Blood Negative  Negative   pH 6.0  4.6 - 8.0   Protein < 30  Negative- <30 mg/dL   Urobilinogen, UR 0.2  0.2 - 1 mg/dL   Nitrite Negative  Negative   Leukocyte Esterase Trace  Negative   RBC /  HPF 0-2  0 - 2   WBC, UA 0-2  0 - 2   Bacteria, UA Few  Negative- Trace   Epithelial Cells Few  Negative- Few   Mucus, UA Large  Negative- Small   Amorphous, UA Small  Negative- Small    Urine culture pending  Studies/Results:  No results found.  Medications: I have reviewed the patient's current medications. She needs premedication decadron and EMLA. Patient had misunderstanding that zofran contains sulfa; I have confirmed with my own review and with [facility] pharmacist now that this is not the case, and have explained this to patient.  DISCUSSION: Will follow up urine culture pending. Patient will let us know if temperature more elevated or if localizing symptoms of infection. She will push fluids. As zofran would likely be very helpful for chemo nausea, as there is no contraindication to this drug in relation to sulfa, and as it is extremely unlikely that it was cause of nausea while in hospital at [facility], I  have encouraged her to try one tablet after she is feeling better from present concerns and before chemotherapy. If she is not willing to try the zofran, we will still try to manage chemo nausea with other medications, but I am hopeful that she will consider trying zofran prior.  Assessment/Plan:  1.IIIC carcinosarcoma of ovary: post optimal debulking including rectosigmoid resection and diverting loop colostomy at [facility] 02-15-14. If stable will begin chemo with q 3 week taxol carboplatin next week, and I will see her ~ a week after treatment with labs. RN will review premedication decadron 12 hrs and 6 hrs prior to taxol. 2.post laparoscopic cholecystectomy Jan 2015  3.PAC placed at [facility]. Will need EMLA prescription  4.hypothyroid on replacement, post partial thyroidectomy  5.HTN  6.acute renal insufficiency around recent surgery, resolved.  7.patient concern that zofran caused nausea while hospitalized at [facility]. Hopefully we can resolve this in order to use this generally very helpful  anitemetic with chemotherapy 8. Anemia: related to blood loss at recent surgery, did receive 2 units PRBCs intraoperatively. Hemoglobin improved today, which may be in part with inadequate hydration. Will follow 9.low grade temperature and reported chills in last 2-3 days. Afebrile and does not appear toxic now. Urine culture pendng.   Patient seemed to understand discussion and will let us know if concerns prior to planned first chemotherapy next week.   Gordy Levan, MD   03/17/2014, 5:14 PM

## 2014-03-17 NOTE — Patient Instructions (Signed)
We will send prescriptions to your pharmacy   1.decadron (dexamethasone, steroid) 4 mg. Take five tablets +(=20 mg) with food 12 hrs before taxol chemotherapy and five tablets with food 6 hrs before taxol   2.zofran (ondansetron) 8mg One tablet every 8 hrs as needed for nausea. Will not make you drowsy. Fine to take one tablet AM after chemo whether or not any nausea then, to extend coverage for nausea a bit longer. Other than that dose, fine to take just as needed for nausea   3.ativan (lorazepam) 0.5 mg. One tablet swallow or dissolve under tongue every 6 hrs as needed for nausea. WIll make you drowsy and a little forgetful around each dose. Fine to take one tablet at bedtime night of chemo whether or not any nausea.   You can call any time if needed [phone]  

## 2014-03-18 LAB — URINE CULTURE

## 2014-03-21 ENCOUNTER — Other Ambulatory Visit: Payer: Self-pay | Admitting: Oncology

## 2014-03-21 DIAGNOSIS — C569 Malignant neoplasm of unspecified ovary: Secondary | ICD-10-CM

## 2014-03-22 ENCOUNTER — Telehealth: Payer: Self-pay | Admitting: *Deleted

## 2014-03-22 ENCOUNTER — Other Ambulatory Visit: Payer: Self-pay | Admitting: Oncology

## 2014-03-22 MED ORDER — DEXAMETHASONE 4 MG PO TABS: ORAL_TABLET | ORAL | Status: DC

## 2014-03-22 MED ORDER — HYDROMORPHONE HCL 2 MG PO TABS: 2.0000 mg | ORAL_TABLET | ORAL | Status: DC | PRN

## 2014-03-22 NOTE — Telephone Encounter (Signed)
Message copied by Patton Salles on Tue Mar 22, 2014  9:05 AM ------      Message from: Evlyn Clines P      Created: Mon Mar 21, 2014  4:05 PM       I believe she still needs scripts for decadron 4 mg   Five tabs with food 12 hra and 6 hrs prior to taxol, each dose with food,  #10 for cycle one      And script for EMLA            First chemo is 5-29. RN please speak with her before then:      1.  Let her know, if not already done, that urine cx negative. Let me know if she is still having low grade temp, or chills, or sx infection.            2.  Go over times for decadron prior to first chemo, WITH FOOD      Explain EMLA use            3.  See how she did with trial of zofran. Let me know before 5-29 if she had trouble with this po or if she refuses it, as I need to change chemo premeds prior to treatment time if so.            4.remind her about taxol aches, which can be significant             thanks ------

## 2014-03-22 NOTE — Telephone Encounter (Signed)
Spoke with patient. Instructed her on decadron times and use of EMLA. Denies any further symptoms of UTI. Has not taken zofran yet, will call me after she has tried it. Reviewed taxol aches. Pt needs a refill dilaudid, does not take oxycodone meds-feels like they made her sick.

## 2014-03-23 ENCOUNTER — Telehealth: Payer: Self-pay | Admitting: Oncology

## 2014-03-23 ENCOUNTER — Telehealth: Payer: Self-pay | Admitting: *Deleted

## 2014-03-23 NOTE — Telephone Encounter (Signed)
Pt called to say she took a zofran pill today. Did not have any nausea or vomiting. Felt slightly dizzy when she stood up.

## 2014-03-23 NOTE — Telephone Encounter (Signed)
S/w the pt and she is aware to pick up a June 2015 appt calendar.

## 2014-03-25 ENCOUNTER — Other Ambulatory Visit: Payer: Self-pay | Admitting: Oncology

## 2014-03-25 ENCOUNTER — Telehealth: Payer: Self-pay | Admitting: Oncology

## 2014-03-25 ENCOUNTER — Other Ambulatory Visit: Payer: Self-pay | Admitting: *Deleted

## 2014-03-25 ENCOUNTER — Ambulatory Visit (HOSPITAL_BASED_OUTPATIENT_CLINIC_OR_DEPARTMENT_OTHER): Payer: Medicare Other

## 2014-03-25 ENCOUNTER — Other Ambulatory Visit (HOSPITAL_BASED_OUTPATIENT_CLINIC_OR_DEPARTMENT_OTHER): Payer: Medicare Other

## 2014-03-25 VITALS — BP 128/60 | HR 76 | Temp 98.3°F | Resp 16

## 2014-03-25 DIAGNOSIS — C569 Malignant neoplasm of unspecified ovary: Secondary | ICD-10-CM

## 2014-03-25 DIAGNOSIS — Z5111 Encounter for antineoplastic chemotherapy: Secondary | ICD-10-CM

## 2014-03-25 LAB — CBC WITH DIFFERENTIAL/PLATELET
BASO%: 0 % (ref 0.0–2.0)
Basophils Absolute: 0 10*3/uL (ref 0.0–0.1)
EOS%: 0 % (ref 0.0–7.0)
Eosinophils Absolute: 0 10*3/uL (ref 0.0–0.5)
HCT: 34.1 % — ABNORMAL LOW (ref 34.8–46.6)
HGB: 11.2 g/dL — ABNORMAL LOW (ref 11.6–15.9)
LYMPH%: 6.3 % — AB (ref 14.0–49.7)
MCH: 28.7 pg (ref 25.1–34.0)
MCHC: 32.8 g/dL (ref 31.5–36.0)
MCV: 87.4 fL (ref 79.5–101.0)
MONO#: 0.1 10*3/uL (ref 0.1–0.9)
MONO%: 0.9 % (ref 0.0–14.0)
NEUT%: 92.8 % — ABNORMAL HIGH (ref 38.4–76.8)
NEUTROS ABS: 7.4 10*3/uL — AB (ref 1.5–6.5)
NRBC: 0 % (ref 0–0)
PLATELETS: 333 10*3/uL (ref 145–400)
RBC: 3.9 10*6/uL (ref 3.70–5.45)
RDW: 14.2 % (ref 11.2–14.5)
WBC: 8 10*3/uL (ref 3.9–10.3)
lymph#: 0.5 10*3/uL — ABNORMAL LOW (ref 0.9–3.3)

## 2014-03-25 LAB — COMPREHENSIVE METABOLIC PANEL (CC13)
ALT: 13 U/L (ref 0–55)
AST: 15 U/L (ref 5–34)
Albumin: 4 g/dL (ref 3.5–5.0)
Alkaline Phosphatase: 70 U/L (ref 40–150)
Anion Gap: 13 mEq/L — ABNORMAL HIGH (ref 3–11)
BILIRUBIN TOTAL: 0.38 mg/dL (ref 0.20–1.20)
BUN: 19.8 mg/dL (ref 7.0–26.0)
CO2: 25 mEq/L (ref 22–29)
CREATININE: 0.9 mg/dL (ref 0.6–1.1)
Calcium: 10.1 mg/dL (ref 8.4–10.4)
Chloride: 106 mEq/L (ref 98–109)
Glucose: 181 mg/dl — ABNORMAL HIGH (ref 70–140)
Potassium: 4.2 mEq/L (ref 3.5–5.1)
SODIUM: 144 meq/L (ref 136–145)
TOTAL PROTEIN: 7.4 g/dL (ref 6.4–8.3)

## 2014-03-25 MED ORDER — ONDANSETRON 8 MG/NS 50 ML IVPB
INTRAVENOUS | Status: AC
  Filled 2014-03-25: qty 8

## 2014-03-25 MED ORDER — SODIUM CHLORIDE 0.9 % IV SOLN
473.5000 mg | Freq: Once | INTRAVENOUS | Status: AC
  Administered 2014-03-25: 470 mg via INTRAVENOUS
  Filled 2014-03-25: qty 47

## 2014-03-25 MED ORDER — LORAZEPAM 1 MG PO TABS
ORAL_TABLET | ORAL | Status: AC
  Filled 2014-03-25: qty 1

## 2014-03-25 MED ORDER — DIPHENHYDRAMINE HCL 50 MG/ML IJ SOLN
25.0000 mg | Freq: Once | INTRAMUSCULAR | Status: AC
  Administered 2014-03-25: 25 mg via INTRAVENOUS

## 2014-03-25 MED ORDER — DEXAMETHASONE SODIUM PHOSPHATE 20 MG/5ML IJ SOLN
INTRAMUSCULAR | Status: AC
  Filled 2014-03-25: qty 5

## 2014-03-25 MED ORDER — ONDANSETRON HCL 8 MG PO TABS: ORAL_TABLET | ORAL | Status: DC

## 2014-03-25 MED ORDER — ONDANSETRON 8 MG/50ML IVPB (CHCC)
8.0000 mg | Freq: Once | INTRAVENOUS | Status: AC
  Administered 2014-03-25: 8 mg via INTRAVENOUS

## 2014-03-25 MED ORDER — FAMOTIDINE IN NACL 20-0.9 MG/50ML-% IV SOLN
20.0000 mg | Freq: Once | INTRAVENOUS | Status: AC
  Administered 2014-03-25: 20 mg via INTRAVENOUS

## 2014-03-25 MED ORDER — SODIUM CHLORIDE 0.9 % IJ SOLN
10.0000 mL | INTRAMUSCULAR | Status: DC | PRN
  Administered 2014-03-25: 10 mL
  Filled 2014-03-25: qty 10

## 2014-03-25 MED ORDER — DIPHENHYDRAMINE HCL 50 MG/ML IJ SOLN
INTRAMUSCULAR | Status: AC
  Filled 2014-03-25: qty 1

## 2014-03-25 MED ORDER — FAMOTIDINE IN NACL 20-0.9 MG/50ML-% IV SOLN
INTRAVENOUS | Status: AC
  Filled 2014-03-25: qty 50

## 2014-03-25 MED ORDER — ONDANSETRON 8 MG/50ML IVPB (CHCC)
8.0000 mg | INTRAVENOUS | Status: DC
Start: 2014-03-25 — End: 2014-03-25
  Administered 2014-03-25: 8 mg via INTRAVENOUS

## 2014-03-25 MED ORDER — PACLITAXEL CHEMO INJECTION 300 MG/50ML
175.0000 mg/m2 | Freq: Once | INTRAVENOUS | Status: AC
  Administered 2014-03-25: 348 mg via INTRAVENOUS
  Filled 2014-03-25: qty 58

## 2014-03-25 MED ORDER — DEXAMETHASONE SODIUM PHOSPHATE 20 MG/5ML IJ SOLN
20.0000 mg | Freq: Once | INTRAMUSCULAR | Status: AC
  Administered 2014-03-25: 20 mg via INTRAVENOUS

## 2014-03-25 MED ORDER — LORAZEPAM 1 MG PO TABS
0.5000 mg | ORAL_TABLET | ORAL | Status: DC
  Administered 2014-03-25: 0.5 mg via ORAL

## 2014-03-25 MED ORDER — SODIUM CHLORIDE 0.9 % IV SOLN
Freq: Once | INTRAVENOUS | Status: AC
  Administered 2014-03-25: 09:00:00 via INTRAVENOUS

## 2014-03-25 MED ORDER — HEPARIN SOD (PORK) LOCK FLUSH 100 UNIT/ML IV SOLN
500.0000 [IU] | Freq: Once | INTRAVENOUS | Status: AC | PRN
  Administered 2014-03-25: 500 [IU]
  Filled 2014-03-25: qty 5

## 2014-03-25 NOTE — Telephone Encounter (Signed)
Received the following message from Dr. Marko Plume..  Called and confirmed orders.   "For first chemo today.  1.I do not see that EMLA got sent to pharmacy - 1 large tube 1 RF (tho likely too late for today)  2.Call in zofran "8 mg one every 8 hrs as needed for nausea. Also take one tablet AM after chemo whether or not any nausea." #30 1 RF  3.Be sure she has refills on Phenergan."  Patient took one zofran yesterday with no n/v.  Has emla cream and reports she has phenergan on hand and does not need these refilled.  Order sent for ondansetron.  Patient given instructions to take one tablet tomorrow am.  Infusion nurse received call for patient to take one phenergan at bedtime tonight.  Patient verbalized understanding of these instructions.

## 2014-03-25 NOTE — Telephone Encounter (Signed)
pt req a copy of sch-printed & gave pt copy

## 2014-03-25 NOTE — Progress Notes (Signed)
Medical Oncology  Due to patient's concern that she was intolerant to zofran (causing nausea) while hospitalized at [facility], she tried zofran 8 mg x1 po on 03-23-14, with no nausea.  Zofran ordered with first chemo for today, to be given in divided doses (8 mg IV or po with other premeds then repeat 8 mg po or IV in 30-60 min) and script to be called in for 8 mg q 8 hr prn nausea + 8mg  AM after chemo whether or not any nausea then.  She uses phenergan without difficulty, so can use that after chemo q 6 hr prn nausea and I have asked refills to be available at pharmacy. She should use phenergan night of chemo whether or not any nausea.  Will add ativan 0.5 mg to premeds today and can repeat x1 prn during treatment.  Have decreased premed benadryl to 25 mg given her difficulty tolerating a number of medications.  Have also requested EMLA be sent to pharmacy.  Godfrey Pick, MD

## 2014-03-25 NOTE — Patient Instructions (Signed)
[facility] Discharge Instructions for Patients Receiving Chemotherapy  Today you received the following chemotherapy agents Taxol/Carboplatin To help prevent nausea and vomiting after your treatment, we encourage you to take your nausea medication as prescribed.  If you develop nausea and vomiting that is not controlled by your nausea medication, call the clinic.   BELOW ARE SYMPTOMS THAT SHOULD BE REPORTED IMMEDIATELY:  *FEVER GREATER THAN 100.5 F  *CHILLS WITH OR WITHOUT FEVER  NAUSEA AND VOMITING THAT IS NOT CONTROLLED WITH YOUR NAUSEA MEDICATION  *UNUSUAL SHORTNESS OF BREATH  *UNUSUAL BRUISING OR BLEEDING  TENDERNESS IN MOUTH AND THROAT WITH OR WITHOUT PRESENCE OF ULCERS  *URINARY PROBLEMS  *BOWEL PROBLEMS  UNUSUAL RASH Items with * indicate a potential emergency and should be followed up as soon as possible.  Feel free to call the clinic you have any questions or concerns. The clinic phone number is [phone].    

## 2014-03-28 ENCOUNTER — Other Ambulatory Visit: Payer: Self-pay | Admitting: Oncology

## 2014-03-28 ENCOUNTER — Telehealth: Payer: Self-pay | Admitting: *Deleted

## 2014-03-28 ENCOUNTER — Other Ambulatory Visit: Payer: Self-pay | Admitting: *Deleted

## 2014-03-28 MED ORDER — HYDROMORPHONE HCL 2 MG PO TABS: 2.0000 mg | ORAL_TABLET | Freq: Four times a day (QID) | ORAL | Status: DC | PRN

## 2014-03-28 NOTE — Telephone Encounter (Signed)
Pt called to say she has been having a lot of pain in joints, primarily on right side. Is taking Dilaudid 2mg  every 4 hours and states it is "not knocking it out". Did not fill hydrocodone prescriptions as they make her nauseated. Has only had slight nausea since chemo, no vomiting, denies diarrhea

## 2014-03-28 NOTE — Telephone Encounter (Signed)
Discussed pain with Dr Marko Plume. Feels that these are taxol aches and dilaudid probably will not help. Suggested taking claritin 10mg  today and daily. Use heating pad and hot shower to help with discomfort. Can try ibuprofen or aleve with food 1-2 times daily. May use miralax or senokot s as pain meds can cause constipation. Pt verbalized understanding. Explained to patient to take Dilaudid 2mg  every 6 hours as needed for severe pain.

## 2014-04-03 ENCOUNTER — Other Ambulatory Visit: Payer: Self-pay | Admitting: Oncology

## 2014-04-03 DIAGNOSIS — C569 Malignant neoplasm of unspecified ovary: Secondary | ICD-10-CM

## 2014-04-04 ENCOUNTER — Telehealth: Payer: Self-pay | Admitting: *Deleted

## 2014-04-04 ENCOUNTER — Encounter: Payer: Self-pay | Admitting: Oncology

## 2014-04-04 ENCOUNTER — Ambulatory Visit (HOSPITAL_BASED_OUTPATIENT_CLINIC_OR_DEPARTMENT_OTHER): Payer: Medicare Other | Admitting: Oncology

## 2014-04-04 ENCOUNTER — Other Ambulatory Visit (HOSPITAL_BASED_OUTPATIENT_CLINIC_OR_DEPARTMENT_OTHER): Payer: Medicare Other

## 2014-04-04 ENCOUNTER — Telehealth: Payer: Self-pay | Admitting: Oncology

## 2014-04-04 VITALS — BP 126/70 | HR 71 | Temp 98.8°F | Resp 18 | Ht 67.0 in | Wt 180.3 lb

## 2014-04-04 DIAGNOSIS — D709 Neutropenia, unspecified: Secondary | ICD-10-CM

## 2014-04-04 DIAGNOSIS — D649 Anemia, unspecified: Secondary | ICD-10-CM

## 2014-04-04 DIAGNOSIS — R634 Abnormal weight loss: Secondary | ICD-10-CM

## 2014-04-04 DIAGNOSIS — C569 Malignant neoplasm of unspecified ovary: Secondary | ICD-10-CM

## 2014-04-04 DIAGNOSIS — I10 Essential (primary) hypertension: Secondary | ICD-10-CM

## 2014-04-04 LAB — CBC WITH DIFFERENTIAL/PLATELET
BASO%: 3.2 % — AB (ref 0.0–2.0)
BASOS ABS: 0 10*3/uL (ref 0.0–0.1)
EOS ABS: 0.1 10*3/uL (ref 0.0–0.5)
EOS%: 4 % (ref 0.0–7.0)
HEMATOCRIT: 29 % — AB (ref 34.8–46.6)
HEMOGLOBIN: 9.5 g/dL — AB (ref 11.6–15.9)
LYMPH#: 0.6 10*3/uL — AB (ref 0.9–3.3)
LYMPH%: 43.7 % (ref 14.0–49.7)
MCH: 28.5 pg (ref 25.1–34.0)
MCHC: 32.8 g/dL (ref 31.5–36.0)
MCV: 87.1 fL (ref 79.5–101.0)
MONO#: 0.3 10*3/uL (ref 0.1–0.9)
MONO%: 23.8 % — ABNORMAL HIGH (ref 0.0–14.0)
NEUT%: 25.3 % — AB (ref 38.4–76.8)
NEUTROS ABS: 0.3 10*3/uL — AB (ref 1.5–6.5)
PLATELETS: 262 10*3/uL (ref 145–400)
RBC: 3.33 10*6/uL — ABNORMAL LOW (ref 3.70–5.45)
RDW: 14.1 % (ref 11.2–14.5)
WBC: 1.3 10*3/uL — ABNORMAL LOW (ref 3.9–10.3)
nRBC: 0 % (ref 0–0)

## 2014-04-04 LAB — COMPREHENSIVE METABOLIC PANEL (CC13)
ALBUMIN: 3.8 g/dL (ref 3.5–5.0)
ALT: 13 U/L (ref 0–55)
ANION GAP: 10 meq/L (ref 3–11)
AST: 15 U/L (ref 5–34)
Alkaline Phosphatase: 65 U/L (ref 40–150)
BUN: 20.4 mg/dL (ref 7.0–26.0)
CALCIUM: 9.7 mg/dL (ref 8.4–10.4)
CHLORIDE: 106 meq/L (ref 98–109)
CO2: 26 meq/L (ref 22–29)
Creatinine: 1 mg/dL (ref 0.6–1.1)
Glucose: 135 mg/dl (ref 70–140)
POTASSIUM: 3.7 meq/L (ref 3.5–5.1)
Sodium: 141 mEq/L (ref 136–145)
Total Bilirubin: 0.34 mg/dL (ref 0.20–1.20)
Total Protein: 7.2 g/dL (ref 6.4–8.3)

## 2014-04-04 MED ORDER — TBO-FILGRASTIM 300 MCG/0.5ML ~~LOC~~ SOSY
300.0000 ug | PREFILLED_SYRINGE | Freq: Once | SUBCUTANEOUS | Status: AC
  Administered 2014-04-04: 300 ug via SUBCUTANEOUS
  Filled 2014-04-04: qty 0.5

## 2014-04-04 MED ORDER — CIPROFLOXACIN HCL 250 MG PO TABS: 250.0000 mg | ORAL_TABLET | Freq: Two times a day (BID) | ORAL | Status: DC

## 2014-04-04 NOTE — Telephone Encounter (Signed)
Per staff message and POF I have scheduled appts.  JMW  

## 2014-04-04 NOTE — Telephone Encounter (Signed)
per pof sch appts, trmts, inj, lab-sent emailt o MW to sch appts-adv pt of appts/ Pt will stop by sch to pick up updated copy of sch

## 2014-04-04 NOTE — Progress Notes (Signed)
OFFICE PROGRESS NOTE   04/04/2014   Physicians:Paola Alycia Rossetti, Gareth Eagle (PCP), Laurence Spates, Judeth Horn, Lawerance Bach   INTERVAL HISTORY:   Patient is seen, alone for visit, in follow up of first cycle of adjuvant taxol carboplatin given 03-25-14 for IIIC ovarian carcinosarcoma. She is neutropenic today (day 11), but not febrile, and will begin neupogen. Her granddaughter has viral URI at home, but has kept herself isolated.  In addition to this neutropenia, cycle 1 was complicated by severe taxol aches beginning day 3, which involved RLE and RUE > left and were somewhat improved by claritin, ibuprofen and heat, but not with dilaudid. Those aches have resolved. She had some nausea controlled with zofran and phenergan, was still able to keep down fluids. Taste is improving. She has been tired but no orthostatic symptoms, weight down 10 lbs today.  She has PAC.  ONCOLOGIC HISTORY Patient had been in usual generally good health, status post remote hysterectomy, until abdominal pain with radiation to right shoulder in Jan 2015. She was admitted thru ED 11-21-13 with [place] evidence of cholelithiasis, tho no signs of cholecystitis then. (Note patient tells me that she had CT in Jan, however this EMR has only abdominal US then). She was taken to laparoscopic cholecystectomy by Dr Hulen Skains on 11-22-13, with multiple large stones in specimen and benign pathology. Her post operative course was not remarkable and the pain initially resolved entirely, with patient feeling well at post op visit in mid Feb. By ~March she had more abdominal cramping and dull pain, with abdominal distension and was treated with cipro for apparent UTI. She was seen in ED on 02-03-14 with concern for recurrent UTI, with abdominal symptoms as above + intermittent nausea and vomiting, with abdomen tender on exam then. CT AP found large heterogeneous mass lower mid abdomen/ pelvis 19.4 x 13.6 x 14.3 cm with soft tissue along pericolic gutters and  left anterior abdomen, with mild bilateral hydronephrosis and small right pleural effusion. CA 125 resulted at 67.9 and CEA was 1.0. She saw Dr Alycia Rossetti on 02-09-14 and was hospitalized at [facility] from 02-11-14 thru 02-25-14. She went to exploratory laparotomy on 02-15-14, with findings of 20 cm mass filling pelvis and involving rectosigmoid and bilateral ovaries and tubes, with extension of tumor deep in pelvis and along rectum. Surgery was BSO/omentectomy and rectosigmoid resection with reanastomosis was performed, then diverting loop colostomy due to intraoperative concerns about the proximal rectal stump. She had bilateral ureterolysis for retroperitoneal fibrosis. At completion of procedure, which was optimal debulking, there were scattered <1 cm nodules on peritoneum, small bowel and small bowel mesentery, with no appreciable upper abdominal disease. She was transfused 2 units PRBCs intraoperatively. [facility] [phone] from 02-15-2014) found high grade carcinosarcoma 17 cm originating in one ovary which was adherent to multiple structures, metastatic disease in 1.8 cm peritoneal nodule as well as sigmoid colon nodule 1.5 cm and in omentum, no lymph nodes submitted, no identified LVSI, margin of one end of colon positive, The colon specimen also had adenomatous polyp without high grade dysplasia.  She was hospitalized at [facility] from 02-11-14 thru 02-25-14. Preoperatively she had acute kidney injury, with increase in creatinine from baseline 0.7 to high of 2.17; creatinine postoperatively was 1.52. She needed NG following surgery.  PAC was placed at [facility] on 02-21-14.  Recommendations from [facility] multidisciplinary conference 02-23-2014 was for chemotherapy with platinum and taxane. She had cycle 1 carboplatin taxol on 03-25-14 and was neutropenic by day 11, neupogen added.  Review of systems otherwise: slight clear rhinorrhea today resolved with claritin this AM, no symptoms of URI otherwise. No bleeding. Colostomy  functioning as usual. No LE swelling. No SOB. No new or different pain now. Remainder of 10 point Review of Systems negative.  Objective:  Vital signs in last 24 hours:  BP 126/70  Pulse 71  Temp(Src) 98.8 F (37.1 C) (Oral)  Resp 18  Ht 5\' 7"  (1.702 m)  Wt 180 lb 4.8 oz (81.784 kg)  BMI 28.23 kg/m2 Weight down 10 lbs from 03-13-14.  Alert, oriented and appropriate. Ambulatory without difficulty. Looks tired but otherwise more comfortable today than when I had seen her initially No alopecia  HEENT:PERRL, sclerae not icteric. Oral mucosa moist without lesions, posterior pharynx clear.  Neck supple. No JVD. Nasal turbinates not remarkable. Does not sound nasally congested. Lymphatics:no cervical,suraclavicular adenopathy Resp: clear to auscultation bilaterally and normal percussion bilaterally Cardio: regular rate and rhythm. No gallop. GI: soft, nontender, not distended, no mass or organomegaly. Normally active bowel sounds. Soft brown stool in ostomy bag. Surgical incision not remarkable. Musculoskeletal/ Extremities: without pitting edema, cords, tenderness Neuro: no peripheral neuropathy. Otherwise nonfocal. PSYCH appropriate mood and affect Skin without rash, ecchymosis, petechiae Portacath-without erythema or tenderness  Lab Results:  Results for orders placed in visit on 04/04/14  CBC WITH DIFFERENTIAL      Result Value Ref Range   WBC 1.3 (*) 3.9 - 10.3 10e3/uL   NEUT# 0.3 (*) 1.5 - 6.5 10e3/uL   HGB 9.5 (*) 11.6 - 15.9 g/dL   HCT 29.0 (*) 34.8 - 46.6 %   Platelets 262  145 - 400 10e3/uL   MCV 87.1  79.5 - 101.0 fL   MCH 28.5  25.1 - 34.0 pg   MCHC 32.8  31.5 - 36.0 g/dL   RBC 3.33 (*) 3.70 - 5.45 10e6/uL   RDW 14.1  11.2 - 14.5 %   lymph# 0.6 (*) 0.9 - 3.3 10e3/uL   MONO# 0.3  0.1 - 0.9 10e3/uL   Eosinophils Absolute 0.1  0.0 - 0.5 10e3/uL   Basophils Absolute 0.0  0.0 - 0.1 10e3/uL   NEUT% 25.3 (*) 38.4 - 76.8 %   LYMPH% 43.7  14.0 - 49.7 %   MONO% 23.8 (*)  0.0 - 14.0 %   EOS% 4.0  0.0 - 7.0 %   BASO% 3.2 (*) 0.0 - 2.0 %   nRBC 0  0 - 0 %  COMPREHENSIVE METABOLIC PANEL (XV40)      Result Value Ref Range   Sodium 141  136 - 145 mEq/L   Potassium 3.7  3.5 - 5.1 mEq/L   Chloride 106  98 - 109 mEq/L   CO2 26  22 - 29 mEq/L   Glucose 135  70 - 140 mg/dl   BUN 20.4  7.0 - 26.0 mg/dL   Creatinine 1.0  0.6 - 1.1 mg/dL   Total Bilirubin 0.34  0.20 - 1.20 mg/dL   Alkaline Phosphatase 65  40 - 150 U/L   AST 15  5 - 34 U/L   ALT 13  0 - 55 U/L   Total Protein 7.2  6.4 - 8.3 g/dL   Albumin 3.8  3.5 - 5.0 g/dL   Calcium 9.7  8.4 - 10.4 mg/dL   Anion Gap 10  3 - 11 mEq/L    CA 125 available after visit 14.5, this having been 68 preop  Studies/Results:  No results found.  Medications: I have reviewed the  patient's current medications. Neupogen 300 today, 6-9 and continue if ANC on 6-10 <1.2. Cipro 250 mg bid while neutropenic, begin today.  DISCUSSION: neutropenic precautions discussed, including specifics re adult granddaughter at home with URI. Possible aches with gCSF discussed. She understands that she will need gCSF with subsequent chemotherapy treatments.  She needs to push po fluids with weight loss as noted.  Assessment/Plan: 1.IIIC carcinosarcoma of ovary: post optimal debulking including rectosigmoid resection and diverting loop colostomy at [facility] 02-15-14. First taxol carboplatin given 03-25-14 and neutropenic day 11 today. Begin neupogen, repeat CBC diff on 04-06-14. I will see her 6-15 with labs, then Dr Alycia Rossetti 6-18 and cycle 2 chemo on 04-15-14. She will need gCSF (~ day 6 due to taxol aches). 2.post laparoscopic cholecystectomy Jan 2015  3.PAC placed at [facility].  4.hypothyroid on replacement, post partial thyroidectomy  5.HTN  6.acute renal insufficiency around recent surgery, resolved.  7.no intolerance to zofran  8. Anemia: needed PRBCs around surgery. Hgb down from 11.2 day of chemo to 9.5 today. Follow up with CBC on  04-06-14.    Patient understood all instructions and is in agreement with plan. She has had questions answered to her satisfaction and knows to call for temp >=100.5, symptoms of infection or other concerns prior to next scheduled visit.    Gordy Levan, MD   04/04/2014, 4:41 PM

## 2014-04-05 ENCOUNTER — Ambulatory Visit (HOSPITAL_BASED_OUTPATIENT_CLINIC_OR_DEPARTMENT_OTHER): Payer: Medicare Other

## 2014-04-05 VITALS — BP 127/55 | HR 75 | Temp 98.6°F

## 2014-04-05 DIAGNOSIS — C569 Malignant neoplasm of unspecified ovary: Secondary | ICD-10-CM

## 2014-04-05 DIAGNOSIS — D709 Neutropenia, unspecified: Secondary | ICD-10-CM

## 2014-04-05 LAB — CA 125: CA 125: 14.5 U/mL (ref 0.0–30.2)

## 2014-04-05 MED ORDER — TBO-FILGRASTIM 300 MCG/0.5ML ~~LOC~~ SOSY
300.0000 ug | PREFILLED_SYRINGE | Freq: Once | SUBCUTANEOUS | Status: AC
  Administered 2014-04-05: 300 ug via SUBCUTANEOUS
  Filled 2014-04-05: qty 0.5

## 2014-04-06 ENCOUNTER — Ambulatory Visit: Payer: Medicare Other

## 2014-04-06 ENCOUNTER — Other Ambulatory Visit (HOSPITAL_BASED_OUTPATIENT_CLINIC_OR_DEPARTMENT_OTHER): Payer: Medicare Other

## 2014-04-06 ENCOUNTER — Telehealth: Payer: Self-pay | Admitting: Oncology

## 2014-04-06 DIAGNOSIS — C569 Malignant neoplasm of unspecified ovary: Secondary | ICD-10-CM

## 2014-04-06 LAB — CBC WITH DIFFERENTIAL/PLATELET
BASO%: 1.1 % (ref 0.0–2.0)
Basophils Absolute: 0.1 10*3/uL (ref 0.0–0.1)
EOS ABS: 0.4 10*3/uL (ref 0.0–0.5)
EOS%: 4.3 % (ref 0.0–7.0)
HEMATOCRIT: 30.8 % — AB (ref 34.8–46.6)
HGB: 10.2 g/dL — ABNORMAL LOW (ref 11.6–15.9)
LYMPH%: 9.8 % — ABNORMAL LOW (ref 14.0–49.7)
MCH: 28.8 pg (ref 25.1–34.0)
MCHC: 33 g/dL (ref 31.5–36.0)
MCV: 87.2 fL (ref 79.5–101.0)
MONO#: 1.1 10*3/uL — AB (ref 0.1–0.9)
MONO%: 13 % (ref 0.0–14.0)
NEUT%: 71.8 % (ref 38.4–76.8)
NEUTROS ABS: 6.1 10*3/uL (ref 1.5–6.5)
Platelets: 263 10*3/uL (ref 145–400)
RBC: 3.53 10*6/uL — AB (ref 3.70–5.45)
RDW: 14.7 % — ABNORMAL HIGH (ref 11.2–14.5)
WBC: 8.5 10*3/uL (ref 3.9–10.3)
lymph#: 0.8 10*3/uL — ABNORMAL LOW (ref 0.9–3.3)

## 2014-04-06 NOTE — Progress Notes (Signed)
ANC 6.1 today.  Note from 04/04/14 instructed to get Granix 300 and take Cipro while neutropenic.  If ANC >1.2 she can stop Cipro and Granix.

## 2014-04-06 NOTE — Telephone Encounter (Signed)
cld & spoke to pt in re to sch-pt stated that she got a copy of sch when she arrived for injec-adv iwould note the account

## 2014-04-10 ENCOUNTER — Other Ambulatory Visit: Payer: Self-pay | Admitting: Oncology

## 2014-04-11 ENCOUNTER — Ambulatory Visit (HOSPITAL_BASED_OUTPATIENT_CLINIC_OR_DEPARTMENT_OTHER): Payer: Medicare Other | Admitting: Oncology

## 2014-04-11 ENCOUNTER — Encounter: Payer: Self-pay | Admitting: Oncology

## 2014-04-11 ENCOUNTER — Telehealth: Payer: Self-pay | Admitting: Oncology

## 2014-04-11 ENCOUNTER — Other Ambulatory Visit (HOSPITAL_BASED_OUTPATIENT_CLINIC_OR_DEPARTMENT_OTHER): Payer: Medicare Other

## 2014-04-11 VITALS — BP 106/66 | HR 80 | Temp 98.4°F | Resp 18 | Ht 67.0 in | Wt 181.0 lb

## 2014-04-11 DIAGNOSIS — D539 Nutritional anemia, unspecified: Secondary | ICD-10-CM

## 2014-04-11 DIAGNOSIS — D649 Anemia, unspecified: Secondary | ICD-10-CM

## 2014-04-11 DIAGNOSIS — C569 Malignant neoplasm of unspecified ovary: Secondary | ICD-10-CM

## 2014-04-11 LAB — CBC WITH DIFFERENTIAL/PLATELET
BASO%: 1 % (ref 0.0–2.0)
BASOS ABS: 0.1 10*3/uL (ref 0.0–0.1)
EOS%: 0.6 % (ref 0.0–7.0)
Eosinophils Absolute: 0 10*3/uL (ref 0.0–0.5)
HEMATOCRIT: 30.9 % — AB (ref 34.8–46.6)
HGB: 10.2 g/dL — ABNORMAL LOW (ref 11.6–15.9)
LYMPH%: 12.5 % — AB (ref 14.0–49.7)
MCH: 28.7 pg (ref 25.1–34.0)
MCHC: 33.1 g/dL (ref 31.5–36.0)
MCV: 86.8 fL (ref 79.5–101.0)
MONO#: 0.7 10*3/uL (ref 0.1–0.9)
MONO%: 9.3 % (ref 0.0–14.0)
NEUT#: 5.5 10*3/uL (ref 1.5–6.5)
NEUT%: 76.6 % (ref 38.4–76.8)
PLATELETS: 201 10*3/uL (ref 145–400)
RBC: 3.57 10*6/uL — ABNORMAL LOW (ref 3.70–5.45)
RDW: 14.8 % — ABNORMAL HIGH (ref 11.2–14.5)
WBC: 7.2 10*3/uL (ref 3.9–10.3)
lymph#: 0.9 10*3/uL (ref 0.9–3.3)

## 2014-04-11 LAB — COMPREHENSIVE METABOLIC PANEL (CC13)
ALT: 11 U/L (ref 0–55)
AST: 15 U/L (ref 5–34)
Albumin: 3.7 g/dL (ref 3.5–5.0)
Alkaline Phosphatase: 72 U/L (ref 40–150)
Anion Gap: 10 mEq/L (ref 3–11)
BILIRUBIN TOTAL: 0.24 mg/dL (ref 0.20–1.20)
BUN: 16.6 mg/dL (ref 7.0–26.0)
CO2: 27 mEq/L (ref 22–29)
CREATININE: 0.9 mg/dL (ref 0.6–1.1)
Calcium: 9.2 mg/dL (ref 8.4–10.4)
Chloride: 108 mEq/L (ref 98–109)
Glucose: 150 mg/dl — ABNORMAL HIGH (ref 70–140)
Potassium: 3.8 mEq/L (ref 3.5–5.1)
Sodium: 145 mEq/L (ref 136–145)
Total Protein: 6.9 g/dL (ref 6.4–8.3)

## 2014-04-11 MED ORDER — DEXAMETHASONE 4 MG PO TABS: ORAL_TABLET | ORAL | Status: DC

## 2014-04-11 NOTE — Progress Notes (Signed)
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 She had cycle 1 taxol carboplatin on 8-78-67, complicated by severe taxol aches beginning day 3 and neutropenia by day 11. Counts recovered with 2 doses of granix begun day 11, and are good today; she had no febrile complications of the neutropenia. She has felt much better overall for past 5-6 days, with less taste disturbance, better appetite and better energy. She is to see Dr Alycia Rossetti on 6-18 and will have cycle 2 chemotherapy on 04-15-14. She will need gCSF after each treatment now, tho we will delay this until day 7 due to taxol aches.  She has PAC.   ONCOLOGIC HISTORY Patient had been in usual generally good health, status post remote hysterectomy, until abdominal pain with radiation to right shoulder in Jan 2015. She was admitted thru ED 11-21-13 with [place] evidence of cholelithiasis, tho no signs of cholecystitis then. (Note patient tells me that she had CT in Jan, however this EMR has only abdominal US then). She was taken to laparoscopic cholecystectomy by Dr Hulen Skains on 11-22-13, with multiple large stones in specimen and benign pathology. Her post operative course was not remarkable and the pain initially resolved entirely, with patient feeling well at post op visit in mid Feb. By ~March she had more abdominal cramping and dull pain, with abdominal distension and was treated with cipro for apparent UTI. She was seen in ED on 02-03-14 with concern for recurrent UTI, with abdominal symptoms as above + intermittent nausea and vomiting, with abdomen tender on exam then. CT AP found large heterogeneous mass lower mid abdomen/ pelvis 19.4 x 13.6 x 14.3 cm with soft tissue along pericolic gutters and left anterior abdomen, with mild bilateral  hydronephrosis and small right pleural effusion. CA 125 resulted at 67.9 and CEA was 1.0. She saw Dr Alycia Rossetti on 02-09-14 and was hospitalized at [facility] from 02-11-14 thru 02-25-14. She went to exploratory laparotomy on 02-15-14, with findings of 20 cm mass filling pelvis and involving rectosigmoid and bilateral ovaries and tubes, with extension of tumor deep in pelvis and along rectum. Surgery was BSO/omentectomy and rectosigmoid resection with reanastomosis was performed, then diverting loop colostomy due to intraoperative concerns about the proximal rectal stump. She had bilateral ureterolysis for retroperitoneal fibrosis. At completion of procedure, which was optimal debulking, there were scattered <1 cm nodules on peritoneum, small bowel and small bowel mesentery, with no appreciable upper abdominal disease. She was transfused 2 units PRBCs intraoperatively. [facility] [phone] from 02-15-2014) found high grade carcinosarcoma 17 cm originating in one ovary which was adherent to multiple structures, metastatic disease in 1.8 cm peritoneal nodule as well as sigmoid colon nodule 1.5 cm and in omentum, no lymph nodes submitted, no identified LVSI, margin of one end of colon positive, The colon specimen also had adenomatous polyp without high grade dysplasia.  She was hospitalized at [facility] from 02-11-14 thru 02-25-14. Preoperatively she had acute kidney injury, with increase in creatinine from baseline 0.7 to high of 2.17; creatinine postoperatively was 1.52. She needed NG following surgery.  PAC was placed at [facility] on 02-21-14.  Recommendations from [facility] multidisciplinary conference 02-23-2014 was for chemotherapy with platinum and taxane.  Review of systems as above, also: Taste and smells improved now.No peripheral neuropathy. Colostomy functioning well. Slight right scapula pain  today. No bleeding. No LE swelling. Energy better.Denies SOB. No problems with PAC. Some LE aching from neupogen, resolved. Remainder of 10  point Review of Systems negative.  Objective:  Vital signs in last 24 hours:  BP 106/66  Pulse 80  Temp(Src) 98.4 F (36.9 C) (Oral)  Resp 18  Ht 5\' 7"  (1.702 m)  Wt 181 lb (82.101 kg)  BMI 28.34 kg/m2  Alert, oriented and appropriate. Ambulatory without difficulty. Looks more comfortable overall today No alopecia  HEENT:PERRL, sclerae not icteric. Oral mucosa moist without lesions, posterior pharynx clear.  Neck supple. No JVD.  Lymphatics:no cervical,suraclavicular, axillary or inguinal adenopathy Resp: clear to auscultation bilaterally and normal percussion bilaterally Cardio: regular rate and rhythm. No gallop. GI: soft, nontender, not distended, no mass or organomegaly. Normally active bowel sounds. Surgical incision not remarkable.Soft stool in ostomy bad. Musculoskeletal/ Extremities: without pitting edema, cords, tenderness. Back, right scapula not tender. Neuro: no peripheral neuropathy. Otherwise nonfocal Skin without rash, ecchymosis, petechiae Portacath-without erythema or tenderness  Lab Results:  Results for orders placed in visit on 04/11/14  CBC WITH DIFFERENTIAL      Result Value Ref Range   WBC 7.2  3.9 - 10.3 10e3/uL   NEUT# 5.5  1.5 - 6.5 10e3/uL   HGB 10.2 (*) 11.6 - 15.9 g/dL   HCT 30.9 (*) 34.8 - 46.6 %   Platelets 201  145 - 400 10e3/uL   MCV 86.8  79.5 - 101.0 fL   MCH 28.7  25.1 - 34.0 pg   MCHC 33.1  31.5 - 36.0 g/dL   RBC 3.57 (*) 3.70 - 5.45 10e6/uL   RDW 14.8 (*) 11.2 - 14.5 %   lymph# 0.9  0.9 - 3.3 10e3/uL   MONO# 0.7  0.1 - 0.9 10e3/uL   Eosinophils Absolute 0.0  0.0 - 0.5 10e3/uL   Basophils Absolute 0.1  0.0 - 0.1 10e3/uL   NEUT% 76.6  38.4 - 76.8 %   LYMPH% 12.5 (*) 14.0 - 49.7 %   MONO% 9.3  0.0 - 14.0 %   EOS% 0.6  0.0 - 7.0 %   BASO% 1.0  0.0 - 2.0 %  COMPREHENSIVE METABOLIC PANEL (LK44)      Result Value Ref Range   Sodium 145  136 - 145 mEq/L   Potassium 3.8  3.5 - 5.1 mEq/L   Chloride 108  98 - 109 mEq/L   CO2 27  22  - 29 mEq/L   Glucose 150 (*) 70 - 140 mg/dl   BUN 16.6  7.0 - 26.0 mg/dL   Creatinine 0.9  0.6 - 1.1 mg/dL   Total Bilirubin 0.24  0.20 - 1.20 mg/dL   Alkaline Phosphatase 72  40 - 150 U/L   AST 15  5 - 34 U/L   ALT 11  0 - 55 U/L   Total Protein 6.9  6.4 - 8.3 g/dL   Albumin 3.7  3.5 - 5.0 g/dL   Calcium 9.2  8.4 - 10.4 mg/dL   Anion Gap 10  3 - 11 mEq/L     Studies/Results:  No results found.  Medications: I have reviewed the patient's current medications. She understands premedication decadron schedule. She will begin claritin at least on day 2, for taxol aches; she can use OTC NSAID with food for aches if helpful. She prefers several days of neupogen/granix to single dose neulasta due to potential for aches.  DISCUSSION: patient is in agreement with continuing treatment as planned, due cycle 2  on 04-15-14.  Assessment/Plan: 1.IIIC carcinosarcoma of ovary: post optimal debulking including rectosigmoid resection and diverting loop colostomy at [facility] 02-15-14. First taxol carboplatin given 03-25-14 and neutropenic day 11. Counts have recovered and will proceed with cycle 2 on 04-15-14. Follow up with Dr Alycia Rossetti in [place] on 04-14-14 2.post laparoscopic cholecystectomy Jan 2015  [facility].  4.hypothyroid on replacement, post partial thyroidectomy  5.HTN  6.acute renal insufficiency around recent surgery, resolved.  7.no intolerance to zofran  8. Anemia: needed PRBCs around surgery, not symptomatic with Hgb 10.2 today. Will check iron studies; is not on po iron now.        Maximos Zayas P, MD   04/11/2014, 12:12 PM

## 2014-04-11 NOTE — Telephone Encounter (Signed)
per pof to sch pt appt-sch apptts and gave pt sch-

## 2014-04-11 NOTE — Patient Instructions (Signed)
Decadron (dexamethasone, steroid)  Five of the 4 mg tablets 12 hr and 6 hrs before chemo, each dose with food. Since chemo is at 1130 on 6-19, you will need to take the steroids with food at ~ 1130 PM on Thurs 6-18 and again with food at ~ 5:30 AM on 6-19.  Fine to begin claritin ~ Sat PM after chemo on Fri, for taxol aches. Need to have a little food on your stomach for ibuprofen doses  CALL if you are not able to get in fluids by mouth well enough, for IV fluids if so

## 2014-04-14 ENCOUNTER — Encounter: Payer: Self-pay | Admitting: Gynecologic Oncology

## 2014-04-14 ENCOUNTER — Ambulatory Visit: Payer: Medicare Other | Attending: Gynecologic Oncology | Admitting: Gynecologic Oncology

## 2014-04-14 VITALS — BP 146/66 | HR 79 | Temp 98.4°F | Resp 18 | Ht 67.0 in | Wt 182.0 lb

## 2014-04-14 DIAGNOSIS — E039 Hypothyroidism, unspecified: Secondary | ICD-10-CM | POA: Insufficient documentation

## 2014-04-14 DIAGNOSIS — I10 Essential (primary) hypertension: Secondary | ICD-10-CM | POA: Insufficient documentation

## 2014-04-14 DIAGNOSIS — J45909 Unspecified asthma, uncomplicated: Secondary | ICD-10-CM | POA: Insufficient documentation

## 2014-04-14 DIAGNOSIS — C569 Malignant neoplasm of unspecified ovary: Secondary | ICD-10-CM | POA: Insufficient documentation

## 2014-04-14 DIAGNOSIS — Z933 Colostomy status: Secondary | ICD-10-CM

## 2014-04-14 DIAGNOSIS — Z79899 Other long term (current) drug therapy: Secondary | ICD-10-CM | POA: Insufficient documentation

## 2014-04-14 DIAGNOSIS — Z9221 Personal history of antineoplastic chemotherapy: Secondary | ICD-10-CM | POA: Insufficient documentation

## 2014-04-14 DIAGNOSIS — R971 Elevated cancer antigen 125 [CA 125]: Secondary | ICD-10-CM | POA: Insufficient documentation

## 2014-04-14 NOTE — Progress Notes (Signed)
Consult Note: Gyn-Onc  [name] 68 y.o. female  CC:  Chief Complaint  Patient presents with  . Ovarian Cancer    Follow up    HPI: She is a 68 year old gravida 2 para 2 who is seen today in consultation at the request of Dr. [place] Pink.  Patient 68 year old with a history of hypertension and hypothyroidism. She underwent a laparoscopic cholecystectomy in January 2015 at that revealed no evidence of tumor. She did well initially but then began experiencing the same type of pain that she had prior to her cholecystectomy. She describes the pain as in her abdomen with pressure radiating under her right shoulder blade and that has returned. The pain has not increased in significant severity until the past week and a half. She presented to the emergency room with abdominal pain on 02/03/2014 was admitted to the hospital. A CT scan of the abdomen and pelvis was performed that revealed:  A large heterogeneous mass measuring approximately 19.4 x 13.6 x 14.3 cm is seen within the lower mid abdomen/pelvis. The uterus and ovaries are not definitely visualized. The bladder is compressed and displaced anteriorly by this collection. A few loops of bowel are seen interspersed within this collection along its right lateral aspect. There is abnormal intermediate density fluid/soft tissues seen along the right pericolic gutter. Additionally, nodular peritoneal thickening seen along the left pericolic gutter is well. Tiny soft tissue densities seen within the omental fat of the left anterior hemi abdomen.  No free intraperitoneal air. No enlarged intra-abdominal pelvic lymph nodes. No acute osseous abnormality. No worrisome lytic or blastic osseous lesions.  IMPRESSION:  1. Large heterogeneous mixed attenuation mass within the lower mid pelvis as above. Finding is concerning for malignancy, with possible sarcomatous lesions and/or ovarian tumor in the differential (per EPIC, the patient is status post  hysterectomy, it is unknown at this time whether ovaries were resected at that time as well). Further evaluation with MRI recommended.  2. Small volume ascites adjacent to the liver and spleen.  3. Additional fluid/soft tissue density along the pericolic gutters bilaterally and left anterior abdomen are suspicious for peritoneal implants.  4. Mild bilateral hydronephrosis, likely related to compression of the large pelvic mass.  5. Small right pleural effusion.  She also had an MRI performed that revealed: FINDINGS:  As demonstrated on the CT scan there is a large complex pelvic mass extending up into the lower abdomen. It is a bizarre appearing multi cystic and hemorrhagic lesion with fluid fluid levels throughout.  Very heterogeneous irregular contrast enhancement is noted around and throughout the lesion. I suspect this is a large cystic an hemorrhagic ovarian mass. The patient has had a hysterectomy but I  do not see that the ovaries were removed. No adenopathy is identified. There is mass effect on the colon and small bowel. No inguinal mass or adenopathy. The bony structures are unremarkable. There is a small amount of free pelvic fluid.  IMPRESSION:  Large multi septated cystic and hemorrhagic pelvic mass. An ovarian tumor would be most likely.   She went to exploratory laparotomy on 02-15-14, with findings of 20 cm mass filling pelvis and involving rectosigmoid and bilateral ovaries and tubes, with extension of tumor deep in pelvis and along rectum. Surgery was BSO/omentectomy and rectosigmoid resection with reanastomosis was performed, then diverting loop colostomy due to intraoperative concerns about the proximal rectal stump. She had bilateral ureterolysis for retroperitoneal fibrosis. At completion of procedure, which was optimal debulking, there were scattered <  1 cm nodules on peritoneum, small bowel and small bowel mesentery, with no appreciable upper abdominal disease. She was transfused  2 units PRBCs intraoperatively. [facility] [phone] from 02-15-2014) found high grade carcinosarcoma 17 cm originating in one ovary which was adherent to multiple structures, metastatic disease in 1.8 cm peritoneal nodule as well as sigmoid colon nodule 1.5 cm and in omentum, no lymph nodes submitted, no identified LVSI, margin of one end of colon positive, The colon specimen also had adenomatous polyp without high grade dysplasia.  She was hospitalized at [facility] from 02-11-14 thru 02-25-14. Preoperatively she had acute kidney injury, with increase in creatinine from baseline 0.7 to high of 2.17; creatinine postoperatively was 1.52. She needed NG following surgery.  PAC was placed at [facility] on 02-21-14.   Recommendations from [place] multidisciplinary conference 02-23-2014 was for chemotherapy with platinum and taxane.  She had cycle #1 of paclitaxel and carboplatin on Mar 25 2014. As Dr. Marko Plume states in her note, it was complicated by severe Taxol aches beginning on day 3 neutropenia by day 11. She scheduled for cycle 2 tomorrow. CA 125 is normalized 14.5. She comes in today for her postoperative check. She states that she's overall feeling much more like herself. She's eating better and starting to gain a small amount of weight.  At first the taste of foods did not appeal to her and nothing tasted good.  Her taste buds are coming back. She denies any neuropathy. She did have some Taxol related myalgias on days 2-3. She's planning on using ibuprofen and Claritin prior to thiscycle.   Review of Systems: Constitutional: Denies fever. Skin: No rash, sores, jaundice, itching, or dryness.  Cardiovascular: No chest pain, shortness of breath, or edema  Pulmonary: No cough or wheeze.  Gastro Intestinal: No nausea, vomiting, constipation, or diarrhea reported. No bright red blood per rectum or change in bowel movement.  Genitourinary:  frequency, no  urgency, or dysuria.  Denies vaginal bleeding and discharge.   Musculoskeletal: No myalgia, arthralgia, joint swelling or pain.  Neurologic: No weakness, numbness, or change in gait.  Psychology: Tearful today but very determined to battle her cancer.   Current Meds:  Outpatient Encounter Prescriptions as of 04/14/2014  Medication Sig  . amLODipine (NORVASC) 5 MG tablet Take 5 mg by mouth daily.  Marland Kitchen dexamethasone (DECADRON) 4 MG tablet Take 5 tablets with food 12 hours and 6 hours prior to taxol. Each dose with food  . HYDROmorphone (DILAUDID) 2 MG tablet Take 1 tablet (2 mg total) by mouth every 6 (six) hours as needed for severe pain.  Marland Kitchen levothyroxine (SYNTHROID, LEVOTHROID) 75 MCG tablet Take 75 mcg by mouth daily before breakfast.  . lidocaine-prilocaine (EMLA) cream Apply 1 application topically as needed.  . metoprolol succinate (TOPROL-XL) 50 MG 24 hr tablet Take 50 mg by mouth daily. Take with or immediately following a meal.  . ondansetron (ZOFRAN) 8 MG tablet Take 1 tablet the morning after chemo then take 1 every 8 hrs as needed for nausea/vomiting  . promethazine (PHENERGAN) 25 MG tablet Take 0.5-1 tablets (12.5-25 mg total) by mouth every 6 (six) hours as needed for nausea or vomiting.  . ranitidine (ZANTAC) 75 MG tablet Take 75 mg by mouth daily.    Allergy:  Allergies  Allergen Reactions  . Epinephrine Anaphylaxis  . Codeine Other (See Comments)    "ill"  . Demerol [Meperidine] Other (See Comments)    "ill"  . Latex Itching    Says gloves "used"  to cause itching.. Not sure today if problem persists.  Clancy Gourd [Nitrofurantoin Macrocrystal] Other (See Comments)    "made her feel reclusive"  . Micardis [Telmisartan] Other (See Comments)    "didn't move for 6 hours"  . Protonix [Pantoprazole Sodium] Other (See Comments)    "ill"  . Sulfa Antibiotics Other (See Comments)    "blind spell"    Social Hx:   History   Social History  . Marital Status: Widowed    Spouse Name: N/A    Number of Children: N/A  . Years of  Education: N/A   Occupational History  . Not on file.   Social History Main Topics  . Smoking status: Never Smoker   . Smokeless tobacco: Never Used  . Alcohol Use: No  . Drug Use: No  . Sexual Activity: Not Currently   Other Topics Concern  . Not on file   Social History Narrative  . No narrative on file    Past Surgical Hx:  Past Surgical History  Procedure Laterality Date  . Tonsillectomy    . Abdominal hysterectomy    . Thyroidectomy, partial    . Cholecystectomy N/A 11/22/2013    Procedure: LAPAROSCOPIC CHOLECYSTECTOMY WITH INTRAOPERATIVE CHOLANGIOGRAM;  Surgeon: Gwenyth Ober, MD;  Location: [address];  Service: General;  Laterality: N/A;    Past Medical Hx:  Past Medical History  Diagnosis Date  . Thyroid disease   . Hypertension   . Anemia   . Asthma   . Hiatal hernia     Oncology Hx:    Ovarian carcinosarcoma   02/09/2014 Initial Diagnosis Ovarian carcinosarcoma   02/15/2014 Surgery IIIC MMMT ovary   03/25/2014 -  Chemotherapy paclitaxel and carboplatin    Family Hx:  Family History  Problem Relation Age of Onset  . Cancer Father     lung    Vitals:  Blood pressure 146/66, pulse 79, temperature 98.4 F (36.9 C), temperature source Oral, resp. rate 18, height 5\' 7"  (1.702 m), weight 182 lb (82.555 kg).  Physical Exam: Well-nourished well-developed female in no acute distress.  Abdomen: Well-healed vertical midline incision. No appreciable hernias. Abdomen is soft and nontender. Left-sided colostomy is pink and functioning and non-prolapsed.  Assessment/Plan:  68 year old with a large abdominal pelvic mass and CA 125 that is slightly elevated at 67.9 and a normal CEA. She underwent a surgery for her stage IIIc carcinosarcoma. She was optimally debulked. Her residual disease consisted of scattered less than 1 cm nodules on the peritoneum, small bowel, and small bowel mesentery. Her CA 125 has normalized since surgery and cycle #1 of chemotherapy. She  looks remarkably well. She continue her chemotherapy under the care of Dr. Marko Plume. She would like to have her colostomy reversed when she completes her treatment. We can also perform a second look laparotomy at that time. Shayana Hornstein A., MD 04/14/2014, 3:10 PM

## 2014-04-14 NOTE — Patient Instructions (Signed)
Followup with Dr. Alycia Rossetti as per Dr. Marko Plume

## 2014-04-15 ENCOUNTER — Ambulatory Visit (HOSPITAL_BASED_OUTPATIENT_CLINIC_OR_DEPARTMENT_OTHER): Payer: Medicare Other

## 2014-04-15 ENCOUNTER — Other Ambulatory Visit (HOSPITAL_BASED_OUTPATIENT_CLINIC_OR_DEPARTMENT_OTHER): Payer: Medicare Other

## 2014-04-15 VITALS — BP 146/64 | HR 92 | Temp 97.5°F | Resp 18

## 2014-04-15 DIAGNOSIS — D649 Anemia, unspecified: Secondary | ICD-10-CM

## 2014-04-15 DIAGNOSIS — C569 Malignant neoplasm of unspecified ovary: Secondary | ICD-10-CM

## 2014-04-15 DIAGNOSIS — Z5111 Encounter for antineoplastic chemotherapy: Secondary | ICD-10-CM

## 2014-04-15 DIAGNOSIS — D539 Nutritional anemia, unspecified: Secondary | ICD-10-CM

## 2014-04-15 LAB — COMPREHENSIVE METABOLIC PANEL (CC13)
ALT: 13 U/L (ref 0–55)
ANION GAP: 11 meq/L (ref 3–11)
AST: 12 U/L (ref 5–34)
Albumin: 4 g/dL (ref 3.5–5.0)
Alkaline Phosphatase: 72 U/L (ref 40–150)
BUN: 22.7 mg/dL (ref 7.0–26.0)
CALCIUM: 9.9 mg/dL (ref 8.4–10.4)
CO2: 24 meq/L (ref 22–29)
Chloride: 107 mEq/L (ref 98–109)
Creatinine: 1 mg/dL (ref 0.6–1.1)
Glucose: 256 mg/dl — ABNORMAL HIGH (ref 70–140)
POTASSIUM: 4.3 meq/L (ref 3.5–5.1)
SODIUM: 142 meq/L (ref 136–145)
TOTAL PROTEIN: 7.7 g/dL (ref 6.4–8.3)
Total Bilirubin: 0.29 mg/dL (ref 0.20–1.20)

## 2014-04-15 LAB — CBC WITH DIFFERENTIAL/PLATELET
BASO%: 0.1 % (ref 0.0–2.0)
Basophils Absolute: 0 10*3/uL (ref 0.0–0.1)
EOS%: 0 % (ref 0.0–7.0)
Eosinophils Absolute: 0 10*3/uL (ref 0.0–0.5)
HCT: 31.8 % — ABNORMAL LOW (ref 34.8–46.6)
HGB: 10.4 g/dL — ABNORMAL LOW (ref 11.6–15.9)
LYMPH#: 0.6 10*3/uL — AB (ref 0.9–3.3)
LYMPH%: 6.9 % — ABNORMAL LOW (ref 14.0–49.7)
MCH: 28.3 pg (ref 25.1–34.0)
MCHC: 32.7 g/dL (ref 31.5–36.0)
MCV: 86.4 fL (ref 79.5–101.0)
MONO#: 0 10*3/uL — ABNORMAL LOW (ref 0.1–0.9)
MONO%: 0.3 % (ref 0.0–14.0)
NEUT#: 8.1 10*3/uL — ABNORMAL HIGH (ref 1.5–6.5)
NEUT%: 92.7 % — ABNORMAL HIGH (ref 38.4–76.8)
Platelets: 210 10*3/uL (ref 145–400)
RBC: 3.68 10*6/uL — ABNORMAL LOW (ref 3.70–5.45)
RDW: 14.3 % (ref 11.2–14.5)
WBC: 8.7 10*3/uL (ref 3.9–10.3)

## 2014-04-15 LAB — IRON AND TIBC CHCC
%SAT: 28 % (ref 21–57)
IRON: 85 ug/dL (ref 41–142)
TIBC: 300 ug/dL (ref 236–444)
UIBC: 215 ug/dL (ref 120–384)

## 2014-04-15 LAB — FERRITIN CHCC: Ferritin: 247 ng/ml (ref 9–269)

## 2014-04-15 MED ORDER — LORAZEPAM 1 MG PO TABS
ORAL_TABLET | ORAL | Status: AC
  Filled 2014-04-15: qty 1

## 2014-04-15 MED ORDER — SODIUM CHLORIDE 0.9 % IJ SOLN
10.0000 mL | INTRAMUSCULAR | Status: DC | PRN
  Administered 2014-04-15: 10 mL
  Filled 2014-04-15: qty 10

## 2014-04-15 MED ORDER — PACLITAXEL CHEMO INJECTION 300 MG/50ML
175.0000 mg/m2 | Freq: Once | INTRAVENOUS | Status: AC
  Administered 2014-04-15: 348 mg via INTRAVENOUS
  Filled 2014-04-15: qty 58

## 2014-04-15 MED ORDER — LORAZEPAM 1 MG PO TABS
0.5000 mg | ORAL_TABLET | ORAL | Status: DC
  Administered 2014-04-15: 0.5 mg via ORAL

## 2014-04-15 MED ORDER — SODIUM CHLORIDE 0.9 % IV SOLN
Freq: Once | INTRAVENOUS | Status: AC
  Administered 2014-04-15: 12:00:00 via INTRAVENOUS

## 2014-04-15 MED ORDER — DEXAMETHASONE SODIUM PHOSPHATE 20 MG/5ML IJ SOLN
INTRAMUSCULAR | Status: AC
  Filled 2014-04-15: qty 5

## 2014-04-15 MED ORDER — DIPHENHYDRAMINE HCL 50 MG/ML IJ SOLN
25.0000 mg | Freq: Once | INTRAMUSCULAR | Status: AC
  Administered 2014-04-15: 25 mg via INTRAVENOUS

## 2014-04-15 MED ORDER — DEXAMETHASONE SODIUM PHOSPHATE 20 MG/5ML IJ SOLN
20.0000 mg | Freq: Once | INTRAMUSCULAR | Status: AC
  Administered 2014-04-15: 20 mg via INTRAVENOUS

## 2014-04-15 MED ORDER — SODIUM CHLORIDE 0.9 % IV SOLN
470.0000 mg | Freq: Once | INTRAVENOUS | Status: AC
  Administered 2014-04-15: 470 mg via INTRAVENOUS
  Filled 2014-04-15: qty 47

## 2014-04-15 MED ORDER — ONDANSETRON 16 MG/50ML IVPB (CHCC)
16.0000 mg | Freq: Once | INTRAVENOUS | Status: AC
  Administered 2014-04-15: 16 mg via INTRAVENOUS

## 2014-04-15 MED ORDER — ONDANSETRON 16 MG/50ML IVPB (CHCC)
INTRAVENOUS | Status: AC
  Filled 2014-04-15: qty 16

## 2014-04-15 MED ORDER — HEPARIN SOD (PORK) LOCK FLUSH 100 UNIT/ML IV SOLN
500.0000 [IU] | Freq: Once | INTRAVENOUS | Status: AC | PRN
  Administered 2014-04-15: 500 [IU]
  Filled 2014-04-15: qty 5

## 2014-04-15 MED ORDER — FAMOTIDINE IN NACL 20-0.9 MG/50ML-% IV SOLN
INTRAVENOUS | Status: AC
  Filled 2014-04-15: qty 50

## 2014-04-15 MED ORDER — DIPHENHYDRAMINE HCL 50 MG/ML IJ SOLN
INTRAMUSCULAR | Status: AC
  Filled 2014-04-15: qty 1

## 2014-04-15 MED ORDER — FAMOTIDINE IN NACL 20-0.9 MG/50ML-% IV SOLN
20.0000 mg | Freq: Once | INTRAVENOUS | Status: AC
  Administered 2014-04-15: 20 mg via INTRAVENOUS

## 2014-04-15 NOTE — Patient Instructions (Signed)
[facility] Discharge Instructions for Patients Receiving Chemotherapy  Today you received the following chemotherapy agents Taxol and Carboplatin.  To help prevent nausea and vomiting after your treatment, we encourage you to take your nausea medication.   If you develop nausea and vomiting that is not controlled by your nausea medication, call the clinic.   BELOW ARE SYMPTOMS THAT SHOULD BE REPORTED IMMEDIATELY:  *FEVER GREATER THAN 100.5 F  *CHILLS WITH OR WITHOUT FEVER  NAUSEA AND VOMITING THAT IS NOT CONTROLLED WITH YOUR NAUSEA MEDICATION  *UNUSUAL SHORTNESS OF BREATH  *UNUSUAL BRUISING OR BLEEDING  TENDERNESS IN MOUTH AND THROAT WITH OR WITHOUT PRESENCE OF ULCERS  *URINARY PROBLEMS  *BOWEL PROBLEMS  UNUSUAL RASH Items with * indicate a potential emergency and should be followed up as soon as possible.  Feel free to call the clinic you have any questions or concerns. The clinic phone number is [phone].    

## 2014-04-19 ENCOUNTER — Ambulatory Visit (HOSPITAL_BASED_OUTPATIENT_CLINIC_OR_DEPARTMENT_OTHER): Payer: Medicare Other

## 2014-04-19 ENCOUNTER — Other Ambulatory Visit: Payer: Self-pay | Admitting: *Deleted

## 2014-04-19 ENCOUNTER — Telehealth: Payer: Self-pay | Admitting: *Deleted

## 2014-04-19 ENCOUNTER — Other Ambulatory Visit: Payer: Self-pay | Admitting: Oncology

## 2014-04-19 VITALS — BP 124/64 | HR 67 | Temp 97.8°F | Resp 16

## 2014-04-19 DIAGNOSIS — N1330 Unspecified hydronephrosis: Secondary | ICD-10-CM

## 2014-04-19 DIAGNOSIS — C569 Malignant neoplasm of unspecified ovary: Secondary | ICD-10-CM

## 2014-04-19 DIAGNOSIS — R109 Unspecified abdominal pain: Secondary | ICD-10-CM

## 2014-04-19 MED ORDER — ONDANSETRON 16 MG/50ML IVPB (CHCC)
16.0000 mg | Freq: Once | INTRAVENOUS | Status: AC
  Administered 2014-04-19: 16 mg via INTRAVENOUS

## 2014-04-19 MED ORDER — HYDROMORPHONE HCL PF 4 MG/ML IJ SOLN
INTRAMUSCULAR | Status: AC
  Filled 2014-04-19: qty 1

## 2014-04-19 MED ORDER — SODIUM CHLORIDE 0.9 % IV SOLN
INTRAVENOUS | Status: DC
  Administered 2014-04-19: 14:00:00 via INTRAVENOUS

## 2014-04-19 MED ORDER — PROMETHAZINE HCL 25 MG/ML IJ SOLN
12.5000 mg | Freq: Once | INTRAMUSCULAR | Status: AC | PRN
  Administered 2014-04-19: 12.5 mg via INTRAVENOUS
  Filled 2014-04-19: qty 1

## 2014-04-19 MED ORDER — DEXAMETHASONE SODIUM PHOSPHATE 10 MG/ML IJ SOLN
INTRAMUSCULAR | Status: AC
  Filled 2014-04-19: qty 1

## 2014-04-19 MED ORDER — DEXAMETHASONE SODIUM PHOSPHATE 10 MG/ML IJ SOLN
6.0000 mg | Freq: Once | INTRAMUSCULAR | Status: AC
  Administered 2014-04-19: 6 mg via INTRAVENOUS

## 2014-04-19 MED ORDER — SODIUM CHLORIDE 0.9 % IV SOLN
Freq: Once | INTRAVENOUS | Status: AC
  Administered 2014-04-19: 16:00:00 via INTRAVENOUS

## 2014-04-19 MED ORDER — PROMETHAZINE HCL 25 MG/ML IJ SOLN
12.5000 mg | Freq: Once | INTRAMUSCULAR | Status: AC
  Administered 2014-04-19: 12.5 mg via INTRAVENOUS
  Filled 2014-04-19: qty 1

## 2014-04-19 MED ORDER — HEPARIN SOD (PORK) LOCK FLUSH 100 UNIT/ML IV SOLN
500.0000 [IU] | Freq: Once | INTRAVENOUS | Status: DC
  Filled 2014-04-19: qty 5

## 2014-04-19 MED ORDER — SODIUM CHLORIDE 0.9 % IJ SOLN
10.0000 mL | INTRAMUSCULAR | Status: DC | PRN
  Filled 2014-04-19: qty 10

## 2014-04-19 MED ORDER — HYDROMORPHONE HCL PF 4 MG/ML IJ SOLN
0.5000 mg | Freq: Once | INTRAMUSCULAR | Status: AC | PRN
Start: 2014-04-19 — End: 2014-04-19
  Administered 2014-04-19: 1 mg via INTRAVENOUS

## 2014-04-19 MED ORDER — SODIUM CHLORIDE 0.9 % IJ SOLN
10.0000 mL | INTRAMUSCULAR | Status: DC | PRN
  Administered 2014-04-19: 10 mL via INTRAVENOUS
  Filled 2014-04-19: qty 10

## 2014-04-19 MED ORDER — ONDANSETRON 16 MG/50ML IVPB (CHCC)
INTRAVENOUS | Status: AC
  Filled 2014-04-19: qty 16

## 2014-04-19 MED ORDER — HEPARIN SOD (PORK) LOCK FLUSH 100 UNIT/ML IV SOLN
500.0000 [IU] | Freq: Once | INTRAVENOUS | Status: AC
  Administered 2014-04-19: 500 [IU] via INTRAVENOUS
  Filled 2014-04-19: qty 5

## 2014-04-19 NOTE — Patient Instructions (Signed)

## 2014-04-19 NOTE — Telephone Encounter (Signed)
   Provider input needed: Persistent arthralgias & nausea   Reason for call:   Gastrointestinal: positive for nausea and not responding to Zofran prn-Has Promethazine, but has not tried it yet. Denies any emesis. Just refusing to eat or drink. Thinks she has only had 3-4 ounces liquid in past 24 hours. Musculoskeletal:positive for arthralgias and joint pain starting on 6/20-pain is in knees, hands,feet and rated 9/10. Claritin,Motrin,hot bath/shower has not helped her pain. Reports Dilaudid causes more nausea.  ALLERGIES:  is allergic to epinephrine; codeine; demerol; latex; macrodantin; micardis; protonix; and sulfa antibiotics.  Patient last received chemotherapy/ treatment on 03/15/14-Taxol/Carbo (every 3 weeks ) #2  Patient was last seen in the office on 04/11/14  Next appt is 04/25/14 - comes in 6/25 for neupogen  Is patient having fevers greater than 100.5?  no, temp 97.8   Is patient having uncontrolled pain, or new pain? yes   Is patient having new back pain that changes with position (worsens or eases when laying down?)  no   Is patient able to eat and drink? no    Is patient able to pass stool without difficulty?   yes, good ostomy output     Is patient having uncontrolled nausea?  yes, nausea without emesis    patient calls 04/19/2014 with complaint of  see above   Summary Based on the above information advised patient to continue current pain management. Start alternating her Zofran and Phenergan and continue to try to push clear liquids. Try popsicles or italian ice until nausea is controlled. Will make MD aware and call back. Checked with infusion and they could take her today in afternoon if necessary.   Tania Ade  04/19/2014, 8:45 AM   Background Info  [name]   DOB: [birth year]   MR#: [id number]   CSN#   [id number] 04/19/2014

## 2014-04-19 NOTE — Telephone Encounter (Signed)
Called patient to come to office at 1:30 pm for IV fluids. She understands and agrees.

## 2014-04-19 NOTE — Progress Notes (Signed)
Upon discharging the patient, she had two episodes of emesis. Dr. Marko Plume notified. Order given for 500 mL saline and 12.5 mg phenergan IV.

## 2014-04-21 ENCOUNTER — Ambulatory Visit (HOSPITAL_BASED_OUTPATIENT_CLINIC_OR_DEPARTMENT_OTHER): Payer: Medicare Other

## 2014-04-21 ENCOUNTER — Other Ambulatory Visit: Payer: Self-pay | Admitting: Medical Oncology

## 2014-04-21 VITALS — BP 120/62 | HR 65 | Temp 98.3°F

## 2014-04-21 DIAGNOSIS — Z5189 Encounter for other specified aftercare: Secondary | ICD-10-CM

## 2014-04-21 DIAGNOSIS — C569 Malignant neoplasm of unspecified ovary: Secondary | ICD-10-CM

## 2014-04-21 MED ORDER — TBO-FILGRASTIM 300 MCG/0.5ML ~~LOC~~ SOSY
300.0000 ug | PREFILLED_SYRINGE | Freq: Once | SUBCUTANEOUS | Status: AC
  Administered 2014-04-21: 300 ug via SUBCUTANEOUS
  Filled 2014-04-21: qty 0.5

## 2014-04-22 ENCOUNTER — Ambulatory Visit (HOSPITAL_BASED_OUTPATIENT_CLINIC_OR_DEPARTMENT_OTHER): Payer: Medicare Other

## 2014-04-22 VITALS — BP 115/58 | HR 75 | Temp 97.6°F | Resp 16

## 2014-04-22 DIAGNOSIS — Z5189 Encounter for other specified aftercare: Secondary | ICD-10-CM

## 2014-04-22 DIAGNOSIS — C569 Malignant neoplasm of unspecified ovary: Secondary | ICD-10-CM

## 2014-04-22 MED ORDER — TBO-FILGRASTIM 300 MCG/0.5ML ~~LOC~~ SOSY
300.0000 ug | PREFILLED_SYRINGE | Freq: Once | SUBCUTANEOUS | Status: AC
  Administered 2014-04-22: 300 ug via SUBCUTANEOUS
  Filled 2014-04-22: qty 0.5

## 2014-04-22 NOTE — Patient Instructions (Signed)
Tbo-Filgrastim injection What is this medicine? TBO-FILGRASTIM is used to help decrease the time you have low amounts of white blood cells after cancer treatment. It helps the body make more white blood cells. Increasing the amount of white blood cells helps to decrease the risk of infection and fever. This medicine may be used for other purposes; ask your health care provider or pharmacist if you have questions. COMMON BRAND NAME(S): Granix What should I tell my health care provider before I take this medicine? They need to know if you have any of these conditions: -history of blood diseases, like sickle cell anemia or leukemia -an unusual or allergic reaction to tbo-filgrastim, filgrastim, pegfilgrastim, other medicines, foods, dyes, or preservatives -pregnant or trying to get pregnant -breast-feeding How should I use this medicine? This medicine is for injection under the skin. It is given by a health care professional in a hospital or clinic setting. Talk to your pediatrician regarding the use of this medicine in children. Special care may be needed. Overdosage: If you think you've taken too much of this medicine contact a poison control center or emergency room at once. Overdosage: If you think you have taken too much of this medicine contact a poison control center or emergency room at once. NOTE: This medicine is only for you. Do not share this medicine with others. What if I miss a dose? Keep appointments for follow-up doses as directed. It is important not to miss your dose. Call your doctor or health care professional if you are unable to keep an appointment. What may interact with this medicine? -lithium This list may not describe all possible interactions. Give your health care provider a list of all the medicines, herbs, non-prescription drugs, or dietary supplements you use. Also tell them if you smoke, drink alcohol, or use illegal drugs. Some items may interact with your  medicine. What should I watch for while using this medicine? Your condition will be monitored carefully while you are receiving this medicine. You may need blood work done while you are taking this medicine. Avoid taking products that contain aspirin, acetaminophen, ibuprofen, naproxen, or ketoprofen unless instructed by your doctor. These medicines may hide a fever. Call your doctor or health care professional for advice if you get a fever, chills or sore throat, or other symptoms of a cold or flu. Do not treat yourself. What side effects may I notice from receiving this medicine? Side effects that you should report to your doctor or health care professional as soon as possible: -allergic reactions like skin rash, itching or hives, swelling of the face, lips, or tongue -breathing problems -fever -stomach pain Side effects that usually do not require medical attention (Report these to your doctor or health care professional if they continue or are bothersome.): -bone pain This list may not describe all possible side effects. Call your doctor for medical advice about side effects. You may report side effects to FDA at 1-800-FDA-1088. Where should I keep my medicine? This drug is given in a hospital or clinic and will not be stored at home. NOTE: This sheet is a summary. It may not cover all possible information. If you have questions about this medicine, talk to your doctor, pharmacist, or health care provider.  2015, Elsevier/Gold Standard. (2011-07-03 16:41:24)  

## 2014-04-23 ENCOUNTER — Other Ambulatory Visit: Payer: Self-pay | Admitting: Oncology

## 2014-04-23 ENCOUNTER — Ambulatory Visit (HOSPITAL_BASED_OUTPATIENT_CLINIC_OR_DEPARTMENT_OTHER): Payer: Medicare Other

## 2014-04-23 VITALS — BP 119/67 | HR 73 | Temp 97.3°F | Resp 18

## 2014-04-23 DIAGNOSIS — Z5189 Encounter for other specified aftercare: Secondary | ICD-10-CM

## 2014-04-23 DIAGNOSIS — C569 Malignant neoplasm of unspecified ovary: Secondary | ICD-10-CM

## 2014-04-23 MED ORDER — TBO-FILGRASTIM 300 MCG/0.5ML ~~LOC~~ SOSY
300.0000 ug | PREFILLED_SYRINGE | Freq: Once | SUBCUTANEOUS | Status: AC
  Administered 2014-04-23: 300 ug via SUBCUTANEOUS

## 2014-04-25 ENCOUNTER — Ambulatory Visit (HOSPITAL_BASED_OUTPATIENT_CLINIC_OR_DEPARTMENT_OTHER): Payer: Medicare Other | Admitting: Oncology

## 2014-04-25 ENCOUNTER — Encounter: Payer: Self-pay | Admitting: Oncology

## 2014-04-25 ENCOUNTER — Other Ambulatory Visit (HOSPITAL_BASED_OUTPATIENT_CLINIC_OR_DEPARTMENT_OTHER): Payer: Medicare Other

## 2014-04-25 VITALS — BP 124/72 | HR 76 | Temp 98.3°F | Resp 18 | Ht 67.0 in | Wt 179.8 lb

## 2014-04-25 DIAGNOSIS — C569 Malignant neoplasm of unspecified ovary: Secondary | ICD-10-CM

## 2014-04-25 DIAGNOSIS — E039 Hypothyroidism, unspecified: Secondary | ICD-10-CM

## 2014-04-25 DIAGNOSIS — I10 Essential (primary) hypertension: Secondary | ICD-10-CM

## 2014-04-25 DIAGNOSIS — C561 Malignant neoplasm of right ovary: Secondary | ICD-10-CM

## 2014-04-25 LAB — CBC WITH DIFFERENTIAL/PLATELET
BASO%: 1.2 % (ref 0.0–2.0)
Basophils Absolute: 0.1 10*3/uL (ref 0.0–0.1)
EOS%: 3.3 % (ref 0.0–7.0)
Eosinophils Absolute: 0.2 10*3/uL (ref 0.0–0.5)
HEMATOCRIT: 29.4 % — AB (ref 34.8–46.6)
HGB: 9.6 g/dL — ABNORMAL LOW (ref 11.6–15.9)
LYMPH#: 1 10*3/uL (ref 0.9–3.3)
LYMPH%: 16.8 % (ref 14.0–49.7)
MCH: 28.6 pg (ref 25.1–34.0)
MCHC: 32.8 g/dL (ref 31.5–36.0)
MCV: 87.3 fL (ref 79.5–101.0)
MONO#: 1.6 10*3/uL — AB (ref 0.1–0.9)
MONO%: 26.7 % — ABNORMAL HIGH (ref 0.0–14.0)
NEUT#: 3.1 10*3/uL (ref 1.5–6.5)
NEUT%: 52 % (ref 38.4–76.8)
PLATELETS: 243 10*3/uL (ref 145–400)
RBC: 3.36 10*6/uL — ABNORMAL LOW (ref 3.70–5.45)
RDW: 15.3 % — ABNORMAL HIGH (ref 11.2–14.5)
WBC: 6 10*3/uL (ref 3.9–10.3)

## 2014-04-25 LAB — COMPREHENSIVE METABOLIC PANEL (CC13)
ALT: 18 U/L (ref 0–55)
ANION GAP: 9 meq/L (ref 3–11)
AST: 18 U/L (ref 5–34)
Albumin: 4 g/dL (ref 3.5–5.0)
Alkaline Phosphatase: 55 U/L (ref 40–150)
BUN: 17.4 mg/dL (ref 7.0–26.0)
CALCIUM: 9.5 mg/dL (ref 8.4–10.4)
CHLORIDE: 107 meq/L (ref 98–109)
CO2: 27 meq/L (ref 22–29)
Creatinine: 0.9 mg/dL (ref 0.6–1.1)
Glucose: 103 mg/dl (ref 70–140)
Potassium: 3.4 mEq/L — ABNORMAL LOW (ref 3.5–5.1)
Sodium: 142 mEq/L (ref 136–145)
Total Bilirubin: 0.26 mg/dL (ref 0.20–1.20)
Total Protein: 6.9 g/dL (ref 6.4–8.3)

## 2014-04-25 MED ORDER — LORAZEPAM 1 MG PO TABS: ORAL_TABLET | ORAL | Status: DC

## 2014-04-25 NOTE — Progress Notes (Signed)
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 She had cycle 2 carboplatin taxol on 04-15-14, needed IVF with IV antiemetics and IV dilaudid on 6-23, and had neupogen x 3 on 6-25, 26, 27.  Problems were significant nausea not controlled with antiemetics, without vomiting but unable to take po's, and severe aches from taxol not improved with claritin or ibuprofen. The IVF and IV medications were very helpful, including IV ativan, IV phenergan and IV dilaudid. She has not had much aching since neupogen, has taken claritin. "Everything tastes salty" but has been able to eat egg sandwich and Ensure today. 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 She has no peripheral neuropathy. She is not as weak today. She saw Dr Alycia Rossetti on 04-14-14, doing well post surgery. She discussed second look laparotomy with take down of colostomy after chemo completed.  She has PAC.  Patient tells me that son had colostomy for a time after bowel rupture, so she was very familiar with these previously.   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 She was admitted thru ED 11-21-13 with [place] evidence of cholelithiasis, tho no signs of cholecystitis then. (Note patient tells me that she had CT in Jan, however this EMR has only abdominal US then). She was taken to laparoscopic cholecystectomy by Dr Hulen Skains on 11-22-13, with multiple large stones in specimen and benign pathology. Her post operative course was not remarkable and the pain initially  resolved entirely, with patient feeling well at post op visit in mid Feb. By ~March she had more abdominal cramping and dull pain, with abdominal distension and was treated with cipro for apparent UTI. She was seen in ED on 02-03-14 with concern for recurrent UTI, with abdominal symptoms as above + intermittent nausea and vomiting, with abdomen tender on exam then. CT AP found large heterogeneous mass lower mid abdomen/ pelvis 19.4 x 13.6 x 14.3 cm with soft tissue along pericolic gutters and left anterior abdomen, with mild bilateral hydronephrosis and small right pleural effusion. CA 125 resulted at 67.9 and CEA was 1.0. She saw Dr Alycia Rossetti on 02-09-14 and was hospitalized at [facility] from 02-11-14 thru 02-25-14. She went to exploratory laparotomy on 02-15-14, with findings of 20 cm mass filling pelvis and involving rectosigmoid and bilateral ovaries and tubes, with extension of tumor deep in pelvis and along rectum. Surgery was BSO/omentectomy and rectosigmoid resection with reanastomosis was performed, then diverting loop colostomy due to intraoperative concerns about the proximal rectal stump. She had bilateral ureterolysis for retroperitoneal fibrosis. At completion of procedure, which was optimal debulking, there were scattered <1 cm nodules on peritoneum, small bowel and small bowel mesentery, with no appreciable upper abdominal disease. She was transfused 2 units PRBCs intraoperatively. [facility] [phone] from 02-15-2014) found high grade carcinosarcoma 17 cm originating in one ovary which was adherent to multiple structures, metastatic disease in 1.8 cm peritoneal nodule as well as sigmoid colon nodule 1.5 cm and in omentum, no lymph nodes submitted, no identified LVSI, margin of one end of colon positive, The colon specimen also had adenomatous polyp  without high grade dysplasia.  She was hospitalized at [facility] from 02-11-14 thru 02-25-14. Preoperatively she had acute kidney injury, with increase in creatinine from  baseline 0.7 to high of 2.17; creatinine postoperatively was 1.52. She needed NG following surgery.  PAC was placed at [facility] on 02-21-14.  Recommendations from [facility] multidisciplinary conference 02-23-2014 was for chemotherapy with platinum and taxane. She had cycle 1 taxol carboplatin on 1-60-73, complicated by severe taxol aches and by neutropenia day 11.   Review of systems as above, also: No fever or symptoms of infection. No problems with PAC. Is able to sleep. No respiratory problems. No LE swelling, no bleeding. No bladder problems Remainder of 10 point Review of Systems negative.  Objective:  Vital signs in last 24 hours:  BP 124/72  Pulse 76  Temp(Src) 98.3 F (36.8 C) (Oral)  Resp 18  Ht 5\' 7"  (1.702 m)  Wt 179 lb 12.8 oz (81.557 kg)  BMI 28.15 kg/m2  Weight is down 1 lb.  Alert, oriented and appropriate. Ambulatory without difficulty. Somewhat pale, not icteric. Alopecia  HEENT:PERRL, sclerae not icteric. Oral mucosa moist without lesions, posterior pharynx clear.  Neck supple. No JVD.  Lymphatics:no cervical,suraclavicular, axillary or inguinal adenopathy Resp: clear to auscultation bilaterally and normal percussion bilaterally Cardio: regular rate and rhythm. No gallop. GI: soft, nontender, not distended, no mass or organomegaly. Normally active bowel sounds. Surgical incision not remarkable. Ostomy on left with soft brown stool. Musculoskeletal/ Extremities: without pitting edema, cords, tenderness Neuro: no peripheral neuropathy. Otherwise nonfocal. PSYCH normal mood and affect Skin without rash, ecchymosis, petechiae Portacath-without erythema or tenderness  Lab Results:  Results for orders placed in visit on 04/25/14  CBC WITH DIFFERENTIAL      Result Value Ref Range   WBC 6.0  3.9 - 10.3 10e3/uL   NEUT# 3.1  1.5 - 6.5 10e3/uL   HGB 9.6 (*) 11.6 - 15.9 g/dL   HCT 29.4 (*) 34.8 - 46.6 %   Platelets 243  145 - 400 10e3/uL   MCV 87.3  79.5 - 101.0 fL   MCH 28.6   25.1 - 34.0 pg   MCHC 32.8  31.5 - 36.0 g/dL   RBC 3.36 (*) 3.70 - 5.45 10e6/uL   RDW 15.3 (*) 11.2 - 14.5 %   lymph# 1.0  0.9 - 3.3 10e3/uL   MONO# 1.6 (*) 0.1 - 0.9 10e3/uL   Eosinophils Absolute 0.2  0.0 - 0.5 10e3/uL   Basophils Absolute 0.1  0.0 - 0.1 10e3/uL   NEUT% 52.0  38.4 - 76.8 %   LYMPH% 16.8  14.0 - 49.7 %   MONO% 26.7 (*) 0.0 - 14.0 %   EOS% 3.3  0.0 - 7.0 %   BASO% 1.2  0.0 - 2.0 %  COMPREHENSIVE METABOLIC PANEL (XT06)      Result Value Ref Range   Sodium 142  136 - 145 mEq/L   Potassium 3.4 (*) 3.5 - 5.1 mEq/L   Chloride 107  98 - 109 mEq/L   CO2 27  22 - 29 mEq/L   Glucose 103  70 - 140 mg/dl   BUN 17.4  7.0 - 26.0 mg/dL   Creatinine 0.9  0.6 - 1.1 mg/dL   Total Bilirubin 0.26  0.20 - 1.20 mg/dL   Alkaline Phosphatase 55  40 - 150 U/L   AST 18  5 - 34 U/L   ALT 18  0 - 55 U/L   Total Protein 6.9  6.4 - 8.3 g/dL  Albumin 4.0  3.5 - 5.0 g/dL   Calcium 9.5  8.4 - 10.4 mg/dL   Anion Gap 9  3 - 11 mEq/L    Serum iron and ferritin not low on 04-15-14  Studies/Results:  No results found.  Medications: I have reviewed the patient's current medications. Will send prescription for ativan, which she can use SL or PO prn nausea. Will add EMEND to IV antiemetics cycle 3  DISCUSSION: will add IVF on Monday after chemo on Friday with subsequent cycles. No additional neupogen today, as counts were at nadir by day 11 cycle 1.  Assessment/Plan: 1.IIIC carcinosarcoma of ovary: post optimal debulking including rectosigmoid resection and diverting loop colostomy at [facility] 02-15-14. First taxol carboplatin given 03-25-14 and neutropenic day 11. I will see her with labs 7-8 prior to cycle 3 on 05-06-14, and she will have IVF 7-13 and neupogen x 3 days beginning 7-16, timing to avoid taxol aches. Will add IV EMEND to antiemetics day of chemo 2.post laparoscopic cholecystectomy Jan 2015  3.PAC placed at [facility].  4.hypothyroid on replacement, post partial thyroidectomy  5.HTN  6.acute  renal insufficiency around recent surgery, resolved.  7.no intolerance to zofran  8. Anemia: needed PRBCs around surgery, not symptomatic with Hgb a little lower at 9.6 today. Iron studies not low on 04-15-14.    Patient understood all of discussion and is in agreement with plan. She will call prior to next scheduled visit if needed.    Gordy Levan, MD   04/25/2014, 4:41 PM

## 2014-04-26 ENCOUNTER — Telehealth: Payer: Self-pay | Admitting: *Deleted

## 2014-04-26 ENCOUNTER — Telehealth: Payer: Self-pay | Admitting: Oncology

## 2014-04-26 ENCOUNTER — Telehealth: Payer: Self-pay | Admitting: Dietician

## 2014-04-26 NOTE — Telephone Encounter (Signed)
cld pt and vm states not set up yet-will mail pt updated sch

## 2014-04-26 NOTE — Telephone Encounter (Signed)
Per POF scheduled appt. 

## 2014-04-26 NOTE — Telephone Encounter (Signed)
Brief Outpatient Oncology Nutrition Note  Patient has been identified to be at risk on malnutrition screen.  Wt Readings from Last 10 Encounters:  04/25/14 179 lb 12.8 oz (81.557 kg)  04/14/14 182 lb (82.555 kg)  04/11/14 181 lb (82.101 kg)  04/04/14 180 lb 4.8 oz (81.784 kg)  03/17/14 183 lb 4.8 oz (83.144 kg)  03/07/14 190 lb 1.6 oz (86.229 kg)  02/04/14 207 lb 8 oz (94.121 kg)  12/14/13 205 lb 3.2 oz (93.078 kg)  11/22/13 213 lb 6.5 oz (96.8 kg)  11/22/13 213 lb 6.5 oz (96.8 kg)    Dx:  Ovarian Cancer stage IIIC.  Colostomy with possible take down after chemo completed.  Patient of Dr, Marko Plume.  Called patient who was unavailable.  Noted per chart patient with significant nausea uncontrolled with antiemetics as well as taste alterations- "everything tastes salty".  Drinks Ensure.  [place] RD available as needed.  Antonieta Iba, RD, LDN

## 2014-05-01 ENCOUNTER — Other Ambulatory Visit: Payer: Self-pay | Admitting: Oncology

## 2014-05-04 ENCOUNTER — Ambulatory Visit (HOSPITAL_BASED_OUTPATIENT_CLINIC_OR_DEPARTMENT_OTHER): Payer: Medicare Other | Admitting: Oncology

## 2014-05-04 ENCOUNTER — Telehealth: Payer: Self-pay | Admitting: Oncology

## 2014-05-04 ENCOUNTER — Encounter: Payer: Self-pay | Admitting: Oncology

## 2014-05-04 ENCOUNTER — Telehealth: Payer: Self-pay | Admitting: *Deleted

## 2014-05-04 ENCOUNTER — Other Ambulatory Visit (HOSPITAL_BASED_OUTPATIENT_CLINIC_OR_DEPARTMENT_OTHER): Payer: Medicare Other

## 2014-05-04 ENCOUNTER — Ambulatory Visit (HOSPITAL_BASED_OUTPATIENT_CLINIC_OR_DEPARTMENT_OTHER): Payer: Medicare Other

## 2014-05-04 VITALS — BP 137/68 | HR 72 | Temp 97.7°F | Resp 18 | Ht 67.0 in | Wt 185.6 lb

## 2014-05-04 DIAGNOSIS — C569 Malignant neoplasm of unspecified ovary: Secondary | ICD-10-CM

## 2014-05-04 DIAGNOSIS — N390 Urinary tract infection, site not specified: Secondary | ICD-10-CM

## 2014-05-04 DIAGNOSIS — C561 Malignant neoplasm of right ovary: Secondary | ICD-10-CM

## 2014-05-04 DIAGNOSIS — D649 Anemia, unspecified: Secondary | ICD-10-CM

## 2014-05-04 LAB — CBC WITH DIFFERENTIAL/PLATELET
BASO%: 1.4 % (ref 0.0–2.0)
Basophils Absolute: 0.1 10*3/uL (ref 0.0–0.1)
EOS%: 0.9 % (ref 0.0–7.0)
Eosinophils Absolute: 0 10*3/uL (ref 0.0–0.5)
HEMATOCRIT: 29.6 % — AB (ref 34.8–46.6)
HGB: 9.9 g/dL — ABNORMAL LOW (ref 11.6–15.9)
LYMPH%: 21.3 % (ref 14.0–49.7)
MCH: 29.5 pg (ref 25.1–34.0)
MCHC: 33.4 g/dL (ref 31.5–36.0)
MCV: 88.3 fL (ref 79.5–101.0)
MONO#: 0.3 10*3/uL (ref 0.1–0.9)
MONO%: 7 % (ref 0.0–14.0)
NEUT#: 2.6 10*3/uL (ref 1.5–6.5)
NEUT%: 69.4 % (ref 38.4–76.8)
PLATELETS: 196 10*3/uL (ref 145–400)
RBC: 3.36 10*6/uL — ABNORMAL LOW (ref 3.70–5.45)
RDW: 17.2 % — ABNORMAL HIGH (ref 11.2–14.5)
WBC: 3.8 10*3/uL — ABNORMAL LOW (ref 3.9–10.3)
lymph#: 0.8 10*3/uL — ABNORMAL LOW (ref 0.9–3.3)

## 2014-05-04 LAB — URINALYSIS, MICROSCOPIC - CHCC
Bilirubin (Urine): NEGATIVE
Glucose: NEGATIVE mg/dL
Ketones: NEGATIVE mg/dL
NITRITE: NEGATIVE
Protein: NEGATIVE mg/dL
Specific Gravity, Urine: 1.03 (ref 1.003–1.035)
UROBILINOGEN UR: 0.2 mg/dL (ref 0.2–1)
pH: 5 (ref 4.6–8.0)

## 2014-05-04 LAB — COMPREHENSIVE METABOLIC PANEL (CC13)
ALT: 17 U/L (ref 0–55)
ANION GAP: 7 meq/L (ref 3–11)
AST: 14 U/L (ref 5–34)
Albumin: 3.7 g/dL (ref 3.5–5.0)
Alkaline Phosphatase: 53 U/L (ref 40–150)
BILIRUBIN TOTAL: 0.41 mg/dL (ref 0.20–1.20)
BUN: 20.3 mg/dL (ref 7.0–26.0)
CO2: 27 meq/L (ref 22–29)
CREATININE: 1.1 mg/dL (ref 0.6–1.1)
Calcium: 9.2 mg/dL (ref 8.4–10.4)
Chloride: 108 mEq/L (ref 98–109)
GLUCOSE: 132 mg/dL (ref 70–140)
Potassium: 3.7 mEq/L (ref 3.5–5.1)
Sodium: 142 mEq/L (ref 136–145)
Total Protein: 6.5 g/dL (ref 6.4–8.3)

## 2014-05-04 MED ORDER — PROMETHAZINE HCL 25 MG RE SUPP: 25.0000 mg | Freq: Four times a day (QID) | RECTAL | Status: DC | PRN

## 2014-05-04 MED ORDER — HYDROMORPHONE HCL 2 MG PO TABS: 2.0000 mg | ORAL_TABLET | Freq: Four times a day (QID) | ORAL | Status: DC | PRN

## 2014-05-04 MED ORDER — DEXAMETHASONE 4 MG PO TABS: ORAL_TABLET | ORAL | Status: DC

## 2014-05-04 NOTE — Telephone Encounter (Signed)
per LL to sch inj 8/13-15

## 2014-05-04 NOTE — Progress Notes (Signed)
OFFICE PROGRESS NOTE   05/04/2014   Physicians:Paola Alycia Rossetti, Gareth Eagle (PCP), Laurence Spates, Judeth Horn, Lawerance Bach   INTERVAL HISTORY:  Patient is seen, together with sister, in continuing attention to IIIC optimally debulked ovarian carcinosarcoma for which she is receiving adjuvant taxol carboplatin q 3 weeks. Cycle 2 was 04-15-14 with IVF on day 5 (+ zofran, decadron and phenergan IV) and neupogen x3 beginning day7 (timing due to taxol aches). She saw Dr Alycia Rossetti on 04-14-14, plan for second look laparotomy with reversal of colostomy after chemo completes. Patient had more severe aches with this second treatment, lasting 3 days. She had difficulty taking po's including fluids and food for several days beginning ~ day 3-4; the IVF at [facility] helped. She has felt better overall in past week, including able to shop with sister, with improved appetite. She notices low back pain now, possibly from walking quite a bit with the shopping; she has some urinary frequency without dysuria tho has been drinking lots of water.   She has PAC   ONCOLOGIC HISTORY   Ovarian carcinosarcoma   02/09/2014 Initial Diagnosis Ovarian carcinosarcoma   02/15/2014 Surgery IIIC MMMT ovary   03/25/2014 -  Chemotherapy paclitaxel and carboplatin   Patient had been in usual generally good health, status post remote hysterectomy, until abdominal pain with radiation to right shoulder in Jan 2015. She was admitted thru ED 11-21-13 with [place] evidence of cholelithiasis, tho no signs of cholecystitis then. (Note patient tells me that she had CT in Jan, however this EMR has only abdominal US then). She was taken to laparoscopic cholecystectomy by Dr Hulen Skains on 11-22-13, with multiple large stones in specimen and benign pathology. Her post operative course was not remarkable and the pain initially resolved entirely, with patient feeling well at post op visit in mid Feb. By ~March she had more abdominal cramping and dull pain, with abdominal  distension and was treated with cipro for apparent UTI. She was seen in ED on 02-03-14 with concern for recurrent UTI, with abdominal symptoms as above + intermittent nausea and vomiting, with abdomen tender on exam then. CT AP found large heterogeneous mass lower mid abdomen/ pelvis 19.4 x 13.6 x 14.3 cm with soft tissue along pericolic gutters and left anterior abdomen, with mild bilateral hydronephrosis and small right pleural effusion. CA 125 resulted at 67.9 and CEA was 1.0. She saw Dr Alycia Rossetti on 02-09-14 and was hospitalized at [facility] from 02-11-14 thru 02-25-14. She went to exploratory laparotomy on 02-15-14, with findings of 20 cm mass filling pelvis and involving rectosigmoid and bilateral ovaries and tubes, with extension of tumor deep in pelvis and along rectum. Surgery was BSO/omentectomy and rectosigmoid resection with reanastomosis was performed, then diverting loop colostomy due to intraoperative concerns about the proximal rectal stump. She had bilateral ureterolysis for retroperitoneal fibrosis. At completion of procedure, which was optimal debulking, there were scattered <1 cm nodules on peritoneum, small bowel and small bowel mesentery, with no appreciable upper abdominal disease. She was transfused 2 units PRBCs intraoperatively. [facility] [phone] from 02-15-2014) found high grade carcinosarcoma 17 cm originating in one ovary which was adherent to multiple structures, metastatic disease in 1.8 cm peritoneal nodule as well as sigmoid colon nodule 1.5 cm and in omentum, no lymph nodes submitted, no identified LVSI, margin of one end of colon positive, The colon specimen also had adenomatous polyp without high grade dysplasia.  She was hospitalized at [facility] from 02-11-14 thru 02-25-14. Preoperatively she had acute kidney injury, with  increase in creatinine from baseline 0.7 to high of 2.17; creatinine postoperatively was 1.52. She needed NG following surgery.  PAC was placed at [facility] on 02-21-14.   Recommendations from [facility] multidisciplinary conference 02-23-2014 was for chemotherapy with platinum and taxane. She had cycle 1 taxol carboplatin on 3-97-67, complicated by severe taxol aches and by neutropenia day 11.  Review of systems as above, also: No fever or other symptoms of infection. No bleeding. No problems with PAC. No problems with ostomy. No increased SOB. Slight LE swelling when up,  without pain. Minimal numbness tips of fingers only, not bothersome Remainder of 10 point Review of Systems negative.  Objective:  Vital signs in last 24 hours:  BP 137/68  Pulse 72  Temp(Src) 97.7 F (36.5 C) (Oral)  Resp 18  Ht 5\' 7"  (1.702 m)  Wt 185 lb 9.6 oz (84.188 kg)  BMI 29.06 kg/m2 Weight is up 5.5 lbs. Alert, oriented and appropriate. Ambulatory without difficulty.  Partial alopecia  HEENT:PERRL, sclerae not icteric. Oral mucosa moist without lesions, posterior pharynx clear.  Neck supple. No JVD.  Lymphatics:no cervical,suraclavicular or inguinal adenopathy Resp: clear to auscultation bilaterally and normal percussion bilaterally Cardio: regular rate and rhythm. No gallop. GI: soft, nontender, not distended, no mass or organomegaly. Normally active bowel sounds. Surgical incision not remarkable. Soft stool in ostomy bag. Musculoskeletal/ Extremities: Trace edema LE, otherwise without pitting edema, cords, tenderness Neuro: no peripheral neuropathy. Otherwise nonfocal Skin without rash, ecchymosis, petechiae Portacath-without erythema or tenderness  Lab Results:  Results for orders placed in visit on 05/04/14  CBC WITH DIFFERENTIAL      Result Value Ref Range   WBC 3.8 (*) 3.9 - 10.3 10e3/uL   NEUT# 2.6  1.5 - 6.5 10e3/uL   HGB 9.9 (*) 11.6 - 15.9 g/dL   HCT 29.6 (*) 34.8 - 46.6 %   Platelets 196  145 - 400 10e3/uL   MCV 88.3  79.5 - 101.0 fL   MCH 29.5  25.1 - 34.0 pg   MCHC 33.4  31.5 - 36.0 g/dL   RBC 3.36 (*) 3.70 - 5.45 10e6/uL   RDW 17.2 (*) 11.2 - 14.5 %    lymph# 0.8 (*) 0.9 - 3.3 10e3/uL   MONO# 0.3  0.1 - 0.9 10e3/uL   Eosinophils Absolute 0.0  0.0 - 0.5 10e3/uL   Basophils Absolute 0.1  0.0 - 0.1 10e3/uL   NEUT% 69.4  38.4 - 76.8 %   LYMPH% 21.3  14.0 - 49.7 %   MONO% 7.0  0.0 - 14.0 %   EOS% 0.9  0.0 - 7.0 %   BASO% 1.4  0.0 - 2.0 %  COMPREHENSIVE METABOLIC PANEL (HA19)      Result Value Ref Range   Sodium 142  136 - 145 mEq/L   Potassium 3.7  3.5 - 5.1 mEq/L   Chloride 108  98 - 109 mEq/L   CO2 27  22 - 29 mEq/L   Glucose 132  70 - 140 mg/dl   BUN 20.3  7.0 - 26.0 mg/dL   Creatinine 1.1  0.6 - 1.1 mg/dL   Total Bilirubin 0.41  0.20 - 1.20 mg/dL   Alkaline Phosphatase 53  40 - 150 U/L   AST 14  5 - 34 U/L   ALT 17  0 - 55 U/L   Total Protein 6.5  6.4 - 8.3 g/dL   Albumin 3.7  3.5 - 5.0 g/dL   Calcium 9.2  8.4 - 10.4 mg/dL  Anion Gap 7  3 - 11 mEq/L     Studies/Results:  No results found.  Medications: I have reviewed the patient's current medications. Refill dilaudid and decadron. Will have phenergan suppositories available in addition to po antiemetics at home. Reminded her that ativan works well SL.  DISCUSSION: sister has been given this written diagnosis to share with her physicians; I have answered the sister's questions as best possible today, with patient's consent.  Assessment/Plan: 1.IIIC carcinosarcoma of ovary: post optimal debulking including rectosigmoid resection and diverting loop colostomy at [facility] 02-15-14. First taxol carboplatin given 03-25-14 and neutropenic day 11. Will have cycle 3 on 7-10 with IVF/ IV antiemetics on 7-13 and neupogen 7-16,17,18. I will see her again at least 7-20. 2.post laparoscopic cholecystectomy Jan 2015  3.PAC placed at [facility].  4.hypothyroid on replacement, post partial thyroidectomy  5.HTN  6.acute renal insufficiency around recent surgery, resolved.  7.no intolerance to zofran  8. Anemia: needed PRBCs around surgery, not obviously symptomatic now. Iron studies not low on 04-15-14.  Follow.   Patient is in agreement with plan as above.     LIVESAY,LENNIS P, MD   05/04/2014, 2:36 PM

## 2014-05-04 NOTE — Patient Instructions (Signed)
Lorazepam (ativan) can dissolve under tongue and get in as quickly as IV, for nausea  We will send prescription for phenergan suppositories in case too much nausea to use orally

## 2014-05-04 NOTE — Telephone Encounter (Signed)
Per staff message and POF I have scheduled appts. Advised scheduler of appts. JMW  

## 2014-05-04 NOTE — Telephone Encounter (Signed)
per pof to sch pt trmts-Mw sch-sch inj-appts/gavept updated copy of sch

## 2014-05-04 NOTE — Telephone Encounter (Signed)
Prior authorization request received from [place] for Promethazine 25 mg supp.  Request to Managed Care.

## 2014-05-05 ENCOUNTER — Other Ambulatory Visit: Payer: Self-pay | Admitting: Oncology

## 2014-05-05 ENCOUNTER — Encounter: Payer: Self-pay | Admitting: Oncology

## 2014-05-05 ENCOUNTER — Telehealth: Payer: Self-pay | Admitting: *Deleted

## 2014-05-05 DIAGNOSIS — C561 Malignant neoplasm of right ovary: Secondary | ICD-10-CM

## 2014-05-05 LAB — URINE CULTURE

## 2014-05-05 NOTE — Telephone Encounter (Signed)
Pt notified of results below 

## 2014-05-05 NOTE — Progress Notes (Signed)
Faxed promethegan suppository pa form to [facility]

## 2014-05-05 NOTE — Telephone Encounter (Signed)
Message copied by Patton Salles on Thu May 05, 2014 10:39 AM ------      Message from: Evlyn Clines P      Created: Wed May 04, 2014  8:05 PM       Labs seen and need follow up: please let her know the urinalysis looks ok; we will let her know if anything shows up on the culture, but hopefully will be ok there too ------

## 2014-05-06 ENCOUNTER — Other Ambulatory Visit: Payer: Self-pay | Admitting: *Deleted

## 2014-05-06 ENCOUNTER — Encounter: Payer: Self-pay | Admitting: Oncology

## 2014-05-06 ENCOUNTER — Other Ambulatory Visit (HOSPITAL_BASED_OUTPATIENT_CLINIC_OR_DEPARTMENT_OTHER): Payer: Medicare Other

## 2014-05-06 ENCOUNTER — Ambulatory Visit (HOSPITAL_BASED_OUTPATIENT_CLINIC_OR_DEPARTMENT_OTHER): Payer: Medicare Other

## 2014-05-06 ENCOUNTER — Other Ambulatory Visit: Payer: Self-pay | Admitting: Oncology

## 2014-05-06 ENCOUNTER — Telehealth: Payer: Self-pay | Admitting: *Deleted

## 2014-05-06 VITALS — BP 152/68 | HR 92 | Temp 98.2°F | Resp 20

## 2014-05-06 DIAGNOSIS — C561 Malignant neoplasm of right ovary: Secondary | ICD-10-CM

## 2014-05-06 DIAGNOSIS — C569 Malignant neoplasm of unspecified ovary: Secondary | ICD-10-CM

## 2014-05-06 DIAGNOSIS — Z5111 Encounter for antineoplastic chemotherapy: Secondary | ICD-10-CM

## 2014-05-06 LAB — CBC WITH DIFFERENTIAL/PLATELET
BASO%: 0.2 % (ref 0.0–2.0)
BASOS ABS: 0 10*3/uL (ref 0.0–0.1)
EOS%: 0 % (ref 0.0–7.0)
Eosinophils Absolute: 0 10*3/uL (ref 0.0–0.5)
HEMATOCRIT: 31.2 % — AB (ref 34.8–46.6)
HEMOGLOBIN: 10.4 g/dL — AB (ref 11.6–15.9)
LYMPH#: 0.4 10*3/uL — AB (ref 0.9–3.3)
LYMPH%: 6 % — AB (ref 14.0–49.7)
MCH: 29.3 pg (ref 25.1–34.0)
MCHC: 33.2 g/dL (ref 31.5–36.0)
MCV: 88.1 fL (ref 79.5–101.0)
MONO#: 0 10*3/uL — ABNORMAL LOW (ref 0.1–0.9)
MONO%: 0.5 % (ref 0.0–14.0)
NEUT#: 6.3 10*3/uL (ref 1.5–6.5)
NEUT%: 93.3 % — AB (ref 38.4–76.8)
PLATELETS: 228 10*3/uL (ref 145–400)
RBC: 3.54 10*6/uL — ABNORMAL LOW (ref 3.70–5.45)
RDW: 17.1 % — ABNORMAL HIGH (ref 11.2–14.5)
WBC: 6.8 10*3/uL (ref 3.9–10.3)

## 2014-05-06 MED ORDER — DIPHENHYDRAMINE HCL 50 MG/ML IJ SOLN
25.0000 mg | Freq: Once | INTRAMUSCULAR | Status: AC
  Administered 2014-05-06: 25 mg via INTRAVENOUS

## 2014-05-06 MED ORDER — FAMOTIDINE IN NACL 20-0.9 MG/50ML-% IV SOLN
INTRAVENOUS | Status: AC
  Filled 2014-05-06: qty 50

## 2014-05-06 MED ORDER — FAMOTIDINE IN NACL 20-0.9 MG/50ML-% IV SOLN
20.0000 mg | Freq: Once | INTRAVENOUS | Status: AC
  Administered 2014-05-06: 20 mg via INTRAVENOUS

## 2014-05-06 MED ORDER — DIPHENHYDRAMINE HCL 50 MG/ML IJ SOLN
INTRAMUSCULAR | Status: AC
  Filled 2014-05-06: qty 1

## 2014-05-06 MED ORDER — PROMETHAZINE HCL 25 MG RE SUPP: 25.0000 mg | Freq: Four times a day (QID) | RECTAL | Status: DC | PRN

## 2014-05-06 MED ORDER — SODIUM CHLORIDE 0.9 % IV SOLN
441.5000 mg | Freq: Once | INTRAVENOUS | Status: AC
  Administered 2014-05-06: 440 mg via INTRAVENOUS
  Filled 2014-05-06: qty 44

## 2014-05-06 MED ORDER — ONDANSETRON 16 MG/50ML IVPB (CHCC)
16.0000 mg | Freq: Once | INTRAVENOUS | Status: AC
  Administered 2014-05-06: 16 mg via INTRAVENOUS

## 2014-05-06 MED ORDER — ONDANSETRON 16 MG/50ML IVPB (CHCC)
INTRAVENOUS | Status: AC
  Filled 2014-05-06: qty 16

## 2014-05-06 MED ORDER — LORAZEPAM 1 MG PO TABS
0.5000 mg | ORAL_TABLET | ORAL | Status: DC
  Administered 2014-05-06: 0.5 mg via ORAL

## 2014-05-06 MED ORDER — SODIUM CHLORIDE 0.9 % IV SOLN
150.0000 mg | Freq: Once | INTRAVENOUS | Status: AC
  Administered 2014-05-06: 150 mg via INTRAVENOUS
  Filled 2014-05-06: qty 5

## 2014-05-06 MED ORDER — SODIUM CHLORIDE 0.9 % IV SOLN
Freq: Once | INTRAVENOUS | Status: AC
  Administered 2014-05-06: 11:00:00 via INTRAVENOUS

## 2014-05-06 MED ORDER — SODIUM CHLORIDE 0.9 % IJ SOLN
10.0000 mL | INTRAMUSCULAR | Status: DC | PRN
  Administered 2014-05-06: 10 mL
  Filled 2014-05-06: qty 10

## 2014-05-06 MED ORDER — DEXAMETHASONE SODIUM PHOSPHATE 20 MG/5ML IJ SOLN
12.0000 mg | Freq: Once | INTRAMUSCULAR | Status: AC
  Administered 2014-05-06: 12 mg via INTRAVENOUS

## 2014-05-06 MED ORDER — DEXAMETHASONE SODIUM PHOSPHATE 20 MG/5ML IJ SOLN
INTRAMUSCULAR | Status: AC
  Filled 2014-05-06: qty 5

## 2014-05-06 MED ORDER — DEXTROSE 5 % IV SOLN
175.0000 mg/m2 | Freq: Once | INTRAVENOUS | Status: AC
  Administered 2014-05-06: 348 mg via INTRAVENOUS
  Filled 2014-05-06: qty 58

## 2014-05-06 MED ORDER — HEPARIN SOD (PORK) LOCK FLUSH 100 UNIT/ML IV SOLN
500.0000 [IU] | Freq: Once | INTRAVENOUS | Status: AC | PRN
  Administered 2014-05-06: 500 [IU]
  Filled 2014-05-06: qty 5

## 2014-05-06 MED ORDER — LORAZEPAM 1 MG PO TABS
ORAL_TABLET | ORAL | Status: AC
  Filled 2014-05-06: qty 1

## 2014-05-06 NOTE — Patient Instructions (Signed)
[facility] Discharge Instructions for Patients Receiving Chemotherapy  Today you received the following chemotherapy agents Taxol/Carboplatin To help prevent nausea and vomiting after your treatment, we encourage you to take your nausea medication as prescribed.  If you develop nausea and vomiting that is not controlled by your nausea medication, call the clinic.   BELOW ARE SYMPTOMS THAT SHOULD BE REPORTED IMMEDIATELY:  *FEVER GREATER THAN 100.5 F  *CHILLS WITH OR WITHOUT FEVER  NAUSEA AND VOMITING THAT IS NOT CONTROLLED WITH YOUR NAUSEA MEDICATION  *UNUSUAL SHORTNESS OF BREATH  *UNUSUAL BRUISING OR BLEEDING  TENDERNESS IN MOUTH AND THROAT WITH OR WITHOUT PRESENCE OF ULCERS  *URINARY PROBLEMS  *BOWEL PROBLEMS  UNUSUAL RASH Items with * indicate a potential emergency and should be followed up as soon as possible.  Feel free to call the clinic you have any questions or concerns. The clinic phone number is [phone].    

## 2014-05-06 NOTE — Progress Notes (Signed)
[facility] denied promethegan suppositories because they feel it is a high risk medication for this patient; they feel she should try prochlorperazine suppositories

## 2014-05-06 NOTE — Telephone Encounter (Signed)
Message copied by Sharlynn Oliphant A on Fri May 06, 2014  1:25 PM ------      Message from: Gordy Levan      Created: Fri May 06, 2014  8:14 AM       Labs seen and need follow up please let her know cx negative ------

## 2014-05-06 NOTE — Telephone Encounter (Signed)
Spoke with patient in infusion. Notified of results below. Also, patient states she tolerates phenergan without problems and will pay out of pocket for phenergan suppositories

## 2014-05-09 ENCOUNTER — Ambulatory Visit (HOSPITAL_BASED_OUTPATIENT_CLINIC_OR_DEPARTMENT_OTHER): Payer: Medicare Other

## 2014-05-09 VITALS — BP 121/51 | HR 51 | Temp 97.8°F | Resp 18

## 2014-05-09 DIAGNOSIS — C569 Malignant neoplasm of unspecified ovary: Secondary | ICD-10-CM

## 2014-05-09 DIAGNOSIS — C561 Malignant neoplasm of right ovary: Secondary | ICD-10-CM

## 2014-05-09 MED ORDER — HYDROMORPHONE HCL PF 4 MG/ML IJ SOLN
1.0000 mg | Freq: Once | INTRAMUSCULAR | Status: AC | PRN
  Administered 2014-05-09: 1 mg via INTRAVENOUS

## 2014-05-09 MED ORDER — LORAZEPAM 2 MG/ML IJ SOLN
1.0000 mg | Freq: Once | INTRAMUSCULAR | Status: AC | PRN
  Administered 2014-05-09: 1 mg via INTRAVENOUS

## 2014-05-09 MED ORDER — SODIUM CHLORIDE 0.9 % IV SOLN
1000.0000 mL | Freq: Once | INTRAVENOUS | Status: AC
  Administered 2014-05-09: 1000 mL via INTRAVENOUS

## 2014-05-09 MED ORDER — LORAZEPAM 2 MG/ML IJ SOLN
INTRAMUSCULAR | Status: AC
  Filled 2014-05-09: qty 1

## 2014-05-09 MED ORDER — ONDANSETRON 16 MG/50ML IVPB (CHCC)
16.0000 mg | Freq: Once | INTRAVENOUS | Status: AC
  Administered 2014-05-09: 16 mg via INTRAVENOUS

## 2014-05-09 MED ORDER — ONDANSETRON 16 MG/50ML IVPB (CHCC)
INTRAVENOUS | Status: AC
  Filled 2014-05-09: qty 16

## 2014-05-09 MED ORDER — HEPARIN SOD (PORK) LOCK FLUSH 100 UNIT/ML IV SOLN
500.0000 [IU] | Freq: Once | INTRAVENOUS | Status: AC
  Administered 2014-05-09: 500 [IU] via INTRAVENOUS
  Filled 2014-05-09: qty 5

## 2014-05-09 MED ORDER — HYDROMORPHONE HCL PF 1 MG/ML IJ SOLN
1.0000 mg | Freq: Once | INTRAMUSCULAR | Status: DC | PRN
  Filled 2014-05-09: qty 1

## 2014-05-09 MED ORDER — HYDROMORPHONE HCL PF 4 MG/ML IJ SOLN
INTRAMUSCULAR | Status: AC
  Filled 2014-05-09: qty 1

## 2014-05-09 MED ORDER — SODIUM CHLORIDE 0.9 % IJ SOLN
10.0000 mL | INTRAMUSCULAR | Status: DC | PRN
  Administered 2014-05-09: 10 mL via INTRAVENOUS
  Filled 2014-05-09: qty 10

## 2014-05-09 NOTE — Patient Instructions (Signed)
Dehydration, Adult Dehydration is when you lose more fluids from the body than you take in. Vital organs like the kidneys, brain, and heart cannot function without a proper amount of fluids and salt. Any loss of fluids from the body can cause dehydration.  CAUSES   Vomiting.  Diarrhea.  Excessive sweating.  Excessive urine output.  Fever. SYMPTOMS  Mild dehydration  Thirst.  Dry lips.  Slightly dry mouth. Moderate dehydration  Very dry mouth.  Sunken eyes.  Skin does not bounce back quickly when lightly pinched and released.  Dark urine and decreased urine production.  Decreased tear production.  Headache. Severe dehydration  Very dry mouth.  Extreme thirst.  Rapid, weak pulse (more than 100 beats per minute at rest).  Cold hands and feet.  Not able to sweat in spite of heat and temperature.  Rapid breathing.  Blue lips.  Confusion and lethargy.  Difficulty being awakened.  Minimal urine production.  No tears. DIAGNOSIS  Your caregiver will diagnose dehydration based on your symptoms and your exam. Blood and urine tests will help confirm the diagnosis. The diagnostic evaluation should also identify the cause of dehydration. TREATMENT  Treatment of mild or moderate dehydration can often be done at home by increasing the amount of fluids that you drink. It is best to drink small amounts of fluid more often. Drinking too much at one time can make vomiting worse. Refer to the home care instructions below. Severe dehydration needs to be treated at the hospital where you will probably be given intravenous (IV) fluids that contain water and electrolytes. HOME CARE INSTRUCTIONS   Ask your caregiver about specific rehydration instructions.  Drink enough fluids to keep your urine clear or pale yellow.  Drink small amounts frequently if you have nausea and vomiting.  Eat as you normally do.  Avoid:  Foods or drinks high in sugar.  Carbonated  drinks.  Juice.  Extremely hot or cold fluids.  Drinks with caffeine.  Fatty, greasy foods.  Alcohol.  Tobacco.  Overeating.  Gelatin desserts.  Wash your hands well to avoid spreading bacteria and viruses.  Only take over-the-counter or prescription medicines for pain, discomfort, or fever as directed by your caregiver.  Ask your caregiver if you should continue all prescribed and over-the-counter medicines.  Keep all follow-up appointments with your caregiver. SEEK MEDICAL CARE IF:  You have abdominal pain and it increases or stays in one area (localizes).  You have a rash, stiff neck, or severe headache.  You are irritable, sleepy, or difficult to awaken.  You are weak, dizzy, or extremely thirsty. SEEK IMMEDIATE MEDICAL CARE IF:   You are unable to keep fluids down or you get worse despite treatment.  You have frequent episodes of vomiting or diarrhea.  You have blood or green matter (bile) in your vomit.  You have blood in your stool or your stool looks black and tarry.  You have not urinated in 6 to 8 hours, or you have only urinated a small amount of very dark urine.  You have a fever.  You faint. MAKE SURE YOU:   Understand these instructions.  Will watch your condition.  Will get help right away if you are not doing well or get worse. Document Released: 10/14/2005 Document Revised: 01/06/2012 Document Reviewed: 06/03/2011 ExitCare Patient Information 2015 [employer]. This information is not intended to replace advice given to you by your health care provider. Make sure you discuss any questions you have with your health care   provider.  

## 2014-05-12 ENCOUNTER — Ambulatory Visit (HOSPITAL_BASED_OUTPATIENT_CLINIC_OR_DEPARTMENT_OTHER): Payer: Medicare Other

## 2014-05-12 VITALS — BP 121/64 | HR 79 | Temp 97.8°F

## 2014-05-12 DIAGNOSIS — C561 Malignant neoplasm of right ovary: Secondary | ICD-10-CM

## 2014-05-12 DIAGNOSIS — C569 Malignant neoplasm of unspecified ovary: Secondary | ICD-10-CM

## 2014-05-12 DIAGNOSIS — Z5189 Encounter for other specified aftercare: Secondary | ICD-10-CM

## 2014-05-12 MED ORDER — TBO-FILGRASTIM 300 MCG/0.5ML ~~LOC~~ SOSY
300.0000 ug | PREFILLED_SYRINGE | Freq: Once | SUBCUTANEOUS | Status: AC
  Administered 2014-05-12: 300 ug via SUBCUTANEOUS
  Filled 2014-05-12: qty 0.5

## 2014-05-13 ENCOUNTER — Ambulatory Visit (HOSPITAL_BASED_OUTPATIENT_CLINIC_OR_DEPARTMENT_OTHER): Payer: Medicare Other

## 2014-05-13 VITALS — BP 116/67 | HR 82 | Temp 98.4°F

## 2014-05-13 DIAGNOSIS — C561 Malignant neoplasm of right ovary: Secondary | ICD-10-CM

## 2014-05-13 DIAGNOSIS — C569 Malignant neoplasm of unspecified ovary: Secondary | ICD-10-CM

## 2014-05-13 DIAGNOSIS — Z5189 Encounter for other specified aftercare: Secondary | ICD-10-CM

## 2014-05-13 MED ORDER — TBO-FILGRASTIM 300 MCG/0.5ML ~~LOC~~ SOSY
300.0000 ug | PREFILLED_SYRINGE | Freq: Once | SUBCUTANEOUS | Status: AC
  Administered 2014-05-13: 300 ug via SUBCUTANEOUS
  Filled 2014-05-13: qty 0.5

## 2014-05-14 ENCOUNTER — Ambulatory Visit (HOSPITAL_BASED_OUTPATIENT_CLINIC_OR_DEPARTMENT_OTHER): Payer: Medicare Other

## 2014-05-14 VITALS — BP 127/56 | HR 68 | Temp 98.3°F | Resp 18

## 2014-05-14 DIAGNOSIS — Z5189 Encounter for other specified aftercare: Secondary | ICD-10-CM

## 2014-05-14 DIAGNOSIS — C561 Malignant neoplasm of right ovary: Secondary | ICD-10-CM

## 2014-05-14 DIAGNOSIS — C569 Malignant neoplasm of unspecified ovary: Secondary | ICD-10-CM

## 2014-05-14 MED ORDER — TBO-FILGRASTIM 300 MCG/0.5ML ~~LOC~~ SOSY
300.0000 ug | PREFILLED_SYRINGE | Freq: Once | SUBCUTANEOUS | Status: AC
  Administered 2014-05-14: 300 ug via SUBCUTANEOUS

## 2014-05-15 ENCOUNTER — Other Ambulatory Visit: Payer: Self-pay | Admitting: Oncology

## 2014-05-16 ENCOUNTER — Encounter: Payer: Self-pay | Admitting: Oncology

## 2014-05-16 ENCOUNTER — Other Ambulatory Visit (HOSPITAL_BASED_OUTPATIENT_CLINIC_OR_DEPARTMENT_OTHER): Payer: Medicare Other

## 2014-05-16 ENCOUNTER — Ambulatory Visit (HOSPITAL_BASED_OUTPATIENT_CLINIC_OR_DEPARTMENT_OTHER): Payer: Medicare Other | Admitting: Oncology

## 2014-05-16 VITALS — BP 120/87 | HR 75 | Temp 98.1°F | Resp 18 | Ht 67.0 in | Wt 180.4 lb

## 2014-05-16 DIAGNOSIS — C569 Malignant neoplasm of unspecified ovary: Secondary | ICD-10-CM

## 2014-05-16 DIAGNOSIS — I10 Essential (primary) hypertension: Secondary | ICD-10-CM

## 2014-05-16 DIAGNOSIS — C561 Malignant neoplasm of right ovary: Secondary | ICD-10-CM

## 2014-05-16 DIAGNOSIS — E039 Hypothyroidism, unspecified: Secondary | ICD-10-CM

## 2014-05-16 LAB — CBC WITH DIFFERENTIAL/PLATELET
BASO%: 0.9 % (ref 0.0–2.0)
BASOS ABS: 0 10*3/uL (ref 0.0–0.1)
EOS ABS: 0.1 10*3/uL (ref 0.0–0.5)
EOS%: 2.4 % (ref 0.0–7.0)
HEMATOCRIT: 29.9 % — AB (ref 34.8–46.6)
HGB: 10 g/dL — ABNORMAL LOW (ref 11.6–15.9)
LYMPH#: 0.9 10*3/uL (ref 0.9–3.3)
LYMPH%: 21.8 % (ref 14.0–49.7)
MCH: 29.8 pg (ref 25.1–34.0)
MCHC: 33.3 g/dL (ref 31.5–36.0)
MCV: 89.4 fL (ref 79.5–101.0)
MONO#: 1 10*3/uL — AB (ref 0.1–0.9)
MONO%: 24.6 % — ABNORMAL HIGH (ref 0.0–14.0)
NEUT#: 2 10*3/uL (ref 1.5–6.5)
NEUT%: 50.3 % (ref 38.4–76.8)
Platelets: 225 10*3/uL (ref 145–400)
RBC: 3.34 10*6/uL — ABNORMAL LOW (ref 3.70–5.45)
RDW: 17.4 % — ABNORMAL HIGH (ref 11.2–14.5)
WBC: 4.1 10*3/uL (ref 3.9–10.3)

## 2014-05-16 LAB — BASIC METABOLIC PANEL (CC13)
Anion Gap: 9 mEq/L (ref 3–11)
BUN: 16.6 mg/dL (ref 7.0–26.0)
CO2: 28 meq/L (ref 22–29)
CREATININE: 0.9 mg/dL (ref 0.6–1.1)
Calcium: 9.7 mg/dL (ref 8.4–10.4)
Chloride: 107 mEq/L (ref 98–109)
Glucose: 139 mg/dl (ref 70–140)
POTASSIUM: 4 meq/L (ref 3.5–5.1)
Sodium: 144 mEq/L (ref 136–145)

## 2014-05-16 NOTE — Progress Notes (Signed)
OFFICE PROGRESS NOTE   05/16/2014   Physicians:Paola Alycia Rossetti, Gareth Eagle (PCP), Laurence Spates, Judeth Horn, Lawerance Bach   INTERVAL HISTORY:  Patient is seen, together with granddaughter, in continuing attention to chemotherapy in process for IIIC optimally debulked ovarian cancer, with cycle 3 taxol carboplatin given 05-06-14. She has had a rather difficult time with chemotherapy thus far, but is tolerating better with  IVF and IV antiemetics on day 4, and neupogen x3 days beginning day 7.  She is to have CT AP on 05-23-14 prior to visit with Dr Josephina Shih on 05-27-14.   She has felt better for past 24 hours since most recent chemo, day 10 today, with just minimal nausea now. She has most difficulty drinking fluids after chemo, likely accounting for "heart racing" that she noticed at rest on ~ day 4; she did continue usual toprol XL 50 mg daily and is not drinking caffeine after treatments. She tolerated Ensure today and wants steak and baked potato for lunch now. She has slight numbness distal toes which she notices at night, and very minimal numbness tips of fingers only.   She has PAC.   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 She was admitted thru ED 11-21-13 with [place] evidence of cholelithiasis, tho no signs of cholecystitis then. (Note patient tells me that she had CT in Jan, however this EMR has only abdominal US then). She was taken to laparoscopic cholecystectomy by Dr Hulen Skains on 11-22-13, with multiple large stones in specimen and benign pathology. Her post operative course was not remarkable and the pain initially resolved entirely, with patient feeling well at post op visit in mid Feb. By ~March  she had more abdominal cramping and dull pain, with abdominal distension and was treated with cipro for apparent UTI. She was seen in ED on 02-03-14 with concern for recurrent UTI, with abdominal symptoms as above + intermittent nausea and vomiting, with abdomen tender on exam then. CT AP found large heterogeneous mass lower mid abdomen/ pelvis 19.4 x 13.6 x 14.3 cm with soft tissue along pericolic gutters and left anterior abdomen, with mild bilateral hydronephrosis and small right pleural effusion. CA 125 resulted at 67.9 and CEA was 1.0. She saw Dr Alycia Rossetti on 02-09-14 and was hospitalized at [facility] from 02-11-14 thru 02-25-14. She went to exploratory laparotomy on 02-15-14, with findings of 20 cm mass filling pelvis and involving rectosigmoid and bilateral ovaries and tubes, with extension of tumor deep in pelvis and along rectum. Surgery was BSO/omentectomy and rectosigmoid resection with reanastomosis was performed, then diverting loop colostomy due to intraoperative concerns about the proximal rectal stump. She had bilateral ureterolysis for retroperitoneal fibrosis. At completion of procedure, which was optimal debulking, there were scattered <1 cm nodules on peritoneum, small bowel and small bowel mesentery, with no appreciable upper abdominal disease. She was transfused 2 units PRBCs intraoperatively. [facility] [id number] from 02-15-2014) found high grade carcinosarcoma 17 cm originating in one ovary which was adherent to multiple structures, metastatic disease in 1.8 cm peritoneal nodule as well as sigmoid colon nodule 1.5 cm and in omentum, no lymph nodes submitted, no identified LVSI, margin of one end of colon positive, The colon specimen also had adenomatous polyp without high grade dysplasia.  She was hospitalized at [facility] from 02-11-14 thru 02-25-14. Preoperatively she had acute kidney injury, with increase in creatinine from baseline 0.7 to high of 2.17; creatinine postoperatively was 1.52. She needed NG  following surgery.  PAC was placed at [facility] on 02-21-14.  Recommendations from [facility] multidisciplinary conference 02-23-2014 was for chemotherapy with platinum and taxane. She had cycle 1 taxol carboplatin on 7-67-20, complicated by severe taxol aches and by neutropenia day 11, gCSF support added. She has needed IVF/ IV antiemetics on ~ day 4 additionally.   Review of systems as above, also: No fever or symptoms of infection. Colostomy functioning well. No SOB.  Remainder of 10 point Review of Systems negative.  Objective:  Vital signs in last 24 hours:  BP 120/87  Pulse 75  Temp(Src) 98.1 F (36.7 C) (Oral)  Resp 18  Ht 5\' 7"  (1.702 m)  Wt 180 lb 6.4 oz (81.829 kg)  BMI 28.25 kg/m2 Weight is down 5 lbs, likely with decreased po intake after chemo. Alert, oriented and appropriate. Ambulatory without difficulty.  Alopecia  HEENT:PERRL, sclerae not icteric. Oral mucosa moist without lesions, posterior pharynx clear.  Neck supple. No JVD.  Lymphatics:no cervical,suraclavicular, axillary or inguinal adenopathy Resp: clear to auscultation bilaterally and normal percussion bilaterally Cardio: regular rate and rhythm, not tachycardic now. No gallop. GI: soft, nontender, not distended, no mass or organomegaly. Normally active bowel sounds. Surgical incision not remarkable. Soft brown stool in ostomy bag. Musculoskeletal/ Extremities: without pitting edema, cords, tenderness Neuro: no significant peripheral neuropathy. Otherwise nonfocal Skin without rash, ecchymosis, petechiae Portacath-without erythema or tenderness  Lab Results:  Results for orders placed in visit on 05/16/14  CBC WITH DIFFERENTIAL      Result Value Ref Range   WBC 4.1  3.9 - 10.3 10e3/uL   NEUT# 2.0  1.5 - 6.5 10e3/uL   HGB 10.0 (*) 11.6 - 15.9 g/dL   HCT 29.9 (*) 34.8 - 46.6 %   Platelets 225  145 - 400 10e3/uL   MCV 89.4  79.5 - 101.0 fL   MCH 29.8  25.1 - 34.0 pg   MCHC 33.3  31.5 - 36.0 g/dL   RBC 3.34 (*)  3.70 - 5.45 10e6/uL   RDW 17.4 (*) 11.2 - 14.5 %   lymph# 0.9  0.9 - 3.3 10e3/uL   MONO# 1.0 (*) 0.1 - 0.9 10e3/uL   Eosinophils Absolute 0.1  0.0 - 0.5 10e3/uL   Basophils Absolute 0.0  0.0 - 0.1 10e3/uL   NEUT% 50.3  38.4 - 76.8 %   LYMPH% 21.8  14.0 - 49.7 %   MONO% 24.6 (*) 0.0 - 14.0 %   EOS% 2.4  0.0 - 7.0 %   BASO% 0.9  0.0 - 2.0 %  BASIC METABOLIC PANEL (NO70)      Result Value Ref Range   Sodium 144  136 - 145 mEq/L   Potassium 4.0  3.5 - 5.1 mEq/L   Chloride 107  98 - 109 mEq/L   CO2 28  22 - 29 mEq/L   Glucose 139  70 - 140 mg/dl   BUN 16.6  7.0 - 26.0 mg/dL   Creatinine 0.9  0.6 - 1.1 mg/dL   Calcium 9.7  8.4 - 10.4 mg/dL   Anion Gap 9  3 - 11 mEq/L    CA 125 on 04-04-14 was 14.5  Studies/Results:  CT CAP with contrast scheduled for 05-23-14   Medications: I have reviewed the patient's current medications. No scripts needed now.  DISCUSSION: patient is concerned that she will have difficulty drinking oral contrast for CT. This likely will be easier in another week out from chemo, and she tolerates cold liquids best. She does not think water based oral contrast at radiology will be preferable to usual oral contrast drink at home. I will have her scheduled back to see me with lab on 06-01-14 and will have chemo on 06-03-14 if no changes based on re-evaluation by scans and gyn oncology. She will need IVF ~ day 4 and neupogen if same regimen continued.  Assessment/Plan: 1.IIIC carcinosarcoma of ovary: post optimal debulking including rectosigmoid resection and diverting loop colostomy at [facility] 02-15-14. She has had 3 cycles of taxol carboplatin from 03-25-14 thru 05-06-14. Restaging CT CAP prior to Dr ClarkePearson's visit next week. 2.post laparoscopic cholecystectomy Jan 2015  3.PAC placed at [facility].  4.hypothyroid on replacement, post partial thyroidectomy  5.HTN  6.acute renal insufficiency around recent surgery, resolved.  7.no intolerance to zofran  8. Anemia: needed PRBCs  around surgery, not obviously symptomatic now. Iron studies not low on 04-15-14. Follow.    Patient and granddaughter in agreement with plan above. Time spent 25 min including >50% counseling and coordination of care.   LIVESAY,LENNIS P, MD   05/16/2014, 11:58 AM

## 2014-05-17 ENCOUNTER — Other Ambulatory Visit: Payer: Self-pay | Admitting: Oncology

## 2014-05-17 DIAGNOSIS — C569 Malignant neoplasm of unspecified ovary: Secondary | ICD-10-CM

## 2014-05-23 ENCOUNTER — Ambulatory Visit (HOSPITAL_COMMUNITY)
Admission: RE | Admit: 2014-05-23 | Discharge: 2014-05-23 | Disposition: A | Discharge status: Home / Self Care | Payer: Medicare Other | Source: Ambulatory Visit | Attending: Oncology | Admitting: Oncology

## 2014-05-23 ENCOUNTER — Encounter (HOSPITAL_COMMUNITY): Payer: Self-pay

## 2014-05-23 DIAGNOSIS — Z933 Colostomy status: Secondary | ICD-10-CM | POA: Diagnosis not present

## 2014-05-23 DIAGNOSIS — K669 Disorder of peritoneum, unspecified: Secondary | ICD-10-CM | POA: Insufficient documentation

## 2014-05-23 DIAGNOSIS — E0789 Other specified disorders of thyroid: Secondary | ICD-10-CM | POA: Diagnosis not present

## 2014-05-23 DIAGNOSIS — E079 Disorder of thyroid, unspecified: Secondary | ICD-10-CM | POA: Insufficient documentation

## 2014-05-23 DIAGNOSIS — C569 Malignant neoplasm of unspecified ovary: Secondary | ICD-10-CM | POA: Insufficient documentation

## 2014-05-23 DIAGNOSIS — K7689 Other specified diseases of liver: Secondary | ICD-10-CM | POA: Insufficient documentation

## 2014-05-23 DIAGNOSIS — N281 Cyst of kidney, acquired: Secondary | ICD-10-CM | POA: Insufficient documentation

## 2014-05-23 DIAGNOSIS — C561 Malignant neoplasm of right ovary: Secondary | ICD-10-CM

## 2014-05-23 DIAGNOSIS — K5730 Diverticulosis of large intestine without perforation or abscess without bleeding: Secondary | ICD-10-CM | POA: Diagnosis not present

## 2014-05-23 MED ORDER — IOHEXOL 300 MG/ML  SOLN
100.0000 mL | Freq: Once | INTRAMUSCULAR | Status: AC | PRN
  Administered 2014-05-23: 100 mL via INTRAVENOUS

## 2014-05-27 ENCOUNTER — Other Ambulatory Visit: Payer: Medicare Other

## 2014-05-27 ENCOUNTER — Ambulatory Visit: Payer: Medicare Other | Attending: Gynecology | Admitting: Gynecology

## 2014-05-27 ENCOUNTER — Encounter: Payer: Self-pay | Admitting: Gynecology

## 2014-05-27 ENCOUNTER — Other Ambulatory Visit: Payer: Self-pay | Admitting: Oncology

## 2014-05-27 ENCOUNTER — Other Ambulatory Visit (HOSPITAL_BASED_OUTPATIENT_CLINIC_OR_DEPARTMENT_OTHER): Payer: Medicare Other

## 2014-05-27 VITALS — BP 114/64 | HR 80 | Temp 98.2°F | Resp 18 | Ht 67.0 in | Wt 182.2 lb

## 2014-05-27 DIAGNOSIS — E0789 Other specified disorders of thyroid: Secondary | ICD-10-CM | POA: Insufficient documentation

## 2014-05-27 DIAGNOSIS — I10 Essential (primary) hypertension: Secondary | ICD-10-CM | POA: Insufficient documentation

## 2014-05-27 DIAGNOSIS — C569 Malignant neoplasm of unspecified ovary: Secondary | ICD-10-CM

## 2014-05-27 DIAGNOSIS — J45909 Unspecified asthma, uncomplicated: Secondary | ICD-10-CM | POA: Diagnosis not present

## 2014-05-27 DIAGNOSIS — Z79899 Other long term (current) drug therapy: Secondary | ICD-10-CM | POA: Diagnosis not present

## 2014-05-27 DIAGNOSIS — C786 Secondary malignant neoplasm of retroperitoneum and peritoneum: Secondary | ICD-10-CM

## 2014-05-27 DIAGNOSIS — Z801 Family history of malignant neoplasm of trachea, bronchus and lung: Secondary | ICD-10-CM | POA: Insufficient documentation

## 2014-05-27 DIAGNOSIS — Z933 Colostomy status: Secondary | ICD-10-CM

## 2014-05-27 LAB — COMPREHENSIVE METABOLIC PANEL (CC13)
ALBUMIN: 3.9 g/dL (ref 3.5–5.0)
ALT: 10 U/L (ref 0–55)
ANION GAP: 9 meq/L (ref 3–11)
AST: 14 U/L (ref 5–34)
Alkaline Phosphatase: 53 U/L (ref 40–150)
BUN: 20.1 mg/dL (ref 7.0–26.0)
CALCIUM: 9.5 mg/dL (ref 8.4–10.4)
CHLORIDE: 107 meq/L (ref 98–109)
CO2: 26 meq/L (ref 22–29)
Creatinine: 0.8 mg/dL (ref 0.6–1.1)
Glucose: 111 mg/dl (ref 70–140)
Potassium: 3.6 mEq/L (ref 3.5–5.1)
Sodium: 142 mEq/L (ref 136–145)
Total Bilirubin: 0.26 mg/dL (ref 0.20–1.20)
Total Protein: 7 g/dL (ref 6.4–8.3)

## 2014-05-27 LAB — CBC WITH DIFFERENTIAL/PLATELET
BASO%: 2.6 % — ABNORMAL HIGH (ref 0.0–2.0)
BASOS ABS: 0.1 10*3/uL (ref 0.0–0.1)
EOS%: 0.6 % (ref 0.0–7.0)
Eosinophils Absolute: 0 10*3/uL (ref 0.0–0.5)
HEMATOCRIT: 29.6 % — AB (ref 34.8–46.6)
HEMOGLOBIN: 10.1 g/dL — AB (ref 11.6–15.9)
LYMPH#: 0.5 10*3/uL — AB (ref 0.9–3.3)
LYMPH%: 18.6 % (ref 14.0–49.7)
MCH: 30.4 pg (ref 25.1–34.0)
MCHC: 34 g/dL (ref 31.5–36.0)
MCV: 89.4 fL (ref 79.5–101.0)
MONO#: 0.3 10*3/uL (ref 0.1–0.9)
MONO%: 11.9 % (ref 0.0–14.0)
NEUT%: 66.3 % (ref 38.4–76.8)
NEUTROS ABS: 1.8 10*3/uL (ref 1.5–6.5)
Platelets: 198 10*3/uL (ref 145–400)
RBC: 3.31 10*6/uL — ABNORMAL LOW (ref 3.70–5.45)
RDW: 17.3 % — ABNORMAL HIGH (ref 11.2–14.5)
WBC: 2.6 10*3/uL — AB (ref 3.9–10.3)

## 2014-05-27 LAB — CA 125: CA 125: 3.8 U/mL (ref 0.0–30.2)

## 2014-05-27 NOTE — Progress Notes (Signed)
Consult Note: Gyn-Onc   [name] 68 y.o. female  Chief Complaint  Patient presents with  . Ovarian Cancer    Scan resutls    Assessment : Stage IIIc carcinosarcoma of the ovary. Status post 3 cycles of carboplatin and Taxol chemotherapy. CA 125 has fallen although recent CT scan shows persistent peritoneal disease.  Plan: The patient's course, CA 125 values, and recent CT scan were reviewed with the patient and her daughter. Questions are answered. I would recommend that the patient continue her current chemotherapy regimen. However I think adding a Avastin would be reasonable strategy given the persistent and measurable peritoneal disease. The patient is scheduled for cycle for the next Friday.  Following completion of chemotherapy patient would like to have her colostomy reversed. I indicated to the patient and her daughter that they would need to wait 6 weeks following the last dose of a Avastin before taking surgical intervention.  Patient return to see GYN oncology following completion of her 6 cycle of chemotherapy  Interval History: The patient presents today having received 3 cycles of carboplatin and Taxol chemotherapy. Her last cycle was administered on July 10. Review of records shows the patient has a fall in CA 125. Preoperatively was 67 units per mL and on July 10 was 14 units per mL. A recent CT scan for reassessment shows persistent peritoneal disease.  Patient's had some toxicity associated with chemotherapy but she reports that her last cycle was better than previously. She has minimal peripheral neuropathy. She does have some joint and bone pain. Her weight fell following surgery but is now stabilized her last 6 weeks.  HPI: patient initially presented with a large heterogeneous mass measuring 19 x 13 x 14 cm. she underwent surgical debulking at [facility] on 02/15/2014. She was optimally debulked although the residual less than 1 cm nodules on the peritoneum at the  completion surgical procedure. She had a low rectal anastomosis and a diverting loop colostomy.  Review of Systems:10 point review of systems is negative except as noted in interval history.   Vitals: Blood pressure 114/64, pulse 80, temperature 98.2 F (36.8 C), temperature source Oral, resp. rate 18, height 5\' 7"  (1.702 m), weight 182 lb 3.2 oz (82.645 kg).  Physical Exam:  Not performed today.     Allergies  Allergen Reactions  . Epinephrine Anaphylaxis  . Codeine Other (See Comments)    "ill"  . Demerol [Meperidine] Other (See Comments)    "ill"  . Latex Itching    Says gloves "used" to cause itching.. Not sure today if problem persists.  Clancy Gourd [Nitrofurantoin Macrocrystal] Other (See Comments)    "made her feel reclusive"  . Micardis [Telmisartan] Other (See Comments)    "didn't move for 6 hours"  . Protonix [Pantoprazole Sodium] Other (See Comments)    "ill"  . Sulfa Antibiotics Other (See Comments)    "blind spell"    Past Medical History  Diagnosis Date  . Thyroid disease   . Hypertension   . Anemia   . Asthma   . Hiatal hernia     Past Surgical History  Procedure Laterality Date  . Tonsillectomy    . Abdominal hysterectomy    . Thyroidectomy, partial    . Cholecystectomy N/A 11/22/2013    Procedure: LAPAROSCOPIC CHOLECYSTECTOMY WITH INTRAOPERATIVE CHOLANGIOGRAM;  Surgeon: Gwenyth Ober, MD;  Location: [facility];  Service: General;  Laterality: N/A;    Current Outpatient Prescriptions  Medication Sig Dispense Refill  . amLODipine (  NORVASC) 5 MG tablet Take 5 mg by mouth daily.      Marland Kitchen dexamethasone (DECADRON) 4 MG tablet Take 5 tablets with food 12 hours and 6 hours prior to taxol. Each dose with food  20 tablet  0  . HYDROmorphone (DILAUDID) 2 MG tablet Take 1 tablet (2 mg total) by mouth every 6 (six) hours as needed for severe pain.  30 tablet  0  . ibuprofen (ADVIL,MOTRIN) 200 MG tablet Take 200 mg by mouth every 6 (six) hours as needed.      Marland Kitchen  levothyroxine (SYNTHROID, LEVOTHROID) 75 MCG tablet Take 75 mcg by mouth daily before breakfast.      . lidocaine-prilocaine (EMLA) cream Apply 1 application topically as needed.  30 g  0  . loratadine (CLARITIN) 10 MG tablet Take 10 mg by mouth daily as needed for allergies.      Marland Kitchen LORazepam (ATIVAN) 1 MG tablet Place 1 tablet under the tongue or swallow every 6 hours as needed for nausea or to relax after chemotherapy.  20 tablet  0  . metoprolol succinate (TOPROL-XL) 50 MG 24 hr tablet Take 50 mg by mouth daily. Take with or immediately following a meal.      . ondansetron (ZOFRAN) 8 MG tablet Take 1 tablet the morning after chemo then take 1 every 8 hrs as needed for nausea/vomiting  30 tablet  1  . promethazine (PHENERGAN) 25 MG suppository Place 1 suppository (25 mg total) rectally every 6 (six) hours as needed for nausea or vomiting.  4 each  1  . promethazine (PHENERGAN) 25 MG tablet Take 0.5-1 tablets (12.5-25 mg total) by mouth every 6 (six) hours as needed for nausea or vomiting.  30 tablet  1  . ranitidine (ZANTAC) 75 MG tablet Take 75 mg by mouth daily.       No current facility-administered medications for this visit.    History   Social History  . Marital Status: Widowed    Spouse Name: N/A    Number of Children: N/A  . Years of Education: N/A   Occupational History  . Not on file.   Social History Main Topics  . Smoking status: Never Smoker   . Smokeless tobacco: Never Used  . Alcohol Use: No  . Drug Use: No  . Sexual Activity: Not Currently   Other Topics Concern  . Not on file   Social History Narrative  . No narrative on file    Family History  Problem Relation Age of Onset  . Cancer Father     lung      Alvino Chapel, MD 05/27/2014, 9:02 AM

## 2014-05-27 NOTE — Patient Instructions (Signed)
Follow up with our clinic per Dr. Mariana Kaufman instructions.

## 2014-05-30 ENCOUNTER — Other Ambulatory Visit: Payer: Self-pay | Admitting: Oncology

## 2014-05-30 ENCOUNTER — Telehealth: Payer: Self-pay | Admitting: *Deleted

## 2014-05-30 DIAGNOSIS — C569 Malignant neoplasm of unspecified ovary: Secondary | ICD-10-CM

## 2014-05-30 NOTE — Telephone Encounter (Signed)
Per POF Tried to call pt. About appt date and time for 8/5. Was unable to leave message.

## 2014-05-31 ENCOUNTER — Other Ambulatory Visit: Payer: Self-pay | Admitting: Oncology

## 2014-05-31 ENCOUNTER — Telehealth: Payer: Self-pay | Admitting: *Deleted

## 2014-05-31 NOTE — Telephone Encounter (Signed)
Called patient as noted below by Dr. Marko Plume and informed her she will be receiving Avastin on Friday, 06/03/14 in addition to Taxol and Carboplatin. Patient agreeable to this. I reviewed side effects of Avastin with patient and informed her we will get a urine sample on Friday prior to Avastin to check for any protein in her urine. Told patient that it is important to let us know before scheduling any surgery or dental work since it will have to be 28 days after last Avastin dose. Patient appreciative of the call and states she does not have anymore questions about the drug. Also let patient know we will give her a handout tomorrow when she sees Dr. Marko Plume about Avastin.

## 2014-05-31 NOTE — Telephone Encounter (Signed)
Message copied by Christa See on Tue May 31, 2014  1:10 PM ------      Message from: Gordy Levan      Created: Fri May 27, 2014 10:43 PM       Dr Josephina Shih would like avastin added to her upcoming treatments; I am to see her on 8-5 and Rx on 8-7.      Could you please do avastin teaching with her by phone prior to 8-5, then we can review with her when I see her. I have put in POF to add avastin to chemo on 8-7.      Thanks            Cc LA, KF ------

## 2014-06-01 ENCOUNTER — Encounter: Payer: Self-pay | Admitting: Oncology

## 2014-06-01 ENCOUNTER — Other Ambulatory Visit (HOSPITAL_BASED_OUTPATIENT_CLINIC_OR_DEPARTMENT_OTHER): Payer: Medicare Other

## 2014-06-01 ENCOUNTER — Ambulatory Visit (HOSPITAL_BASED_OUTPATIENT_CLINIC_OR_DEPARTMENT_OTHER): Payer: Medicare Other | Admitting: Oncology

## 2014-06-01 ENCOUNTER — Ambulatory Visit: Payer: Medicare Other

## 2014-06-01 VITALS — BP 134/61 | HR 80 | Temp 99.2°F | Resp 18 | Ht 67.0 in | Wt 185.8 lb

## 2014-06-01 DIAGNOSIS — D509 Iron deficiency anemia, unspecified: Secondary | ICD-10-CM

## 2014-06-01 DIAGNOSIS — C561 Malignant neoplasm of right ovary: Secondary | ICD-10-CM

## 2014-06-01 DIAGNOSIS — C569 Malignant neoplasm of unspecified ovary: Secondary | ICD-10-CM

## 2014-06-01 DIAGNOSIS — R300 Dysuria: Secondary | ICD-10-CM

## 2014-06-01 DIAGNOSIS — E039 Hypothyroidism, unspecified: Secondary | ICD-10-CM

## 2014-06-01 LAB — CBC WITH DIFFERENTIAL/PLATELET
BASO%: 1.4 % (ref 0.0–2.0)
BASOS ABS: 0 10*3/uL (ref 0.0–0.1)
EOS ABS: 0 10*3/uL (ref 0.0–0.5)
EOS%: 0.9 % (ref 0.0–7.0)
HCT: 29 % — ABNORMAL LOW (ref 34.8–46.6)
HEMOGLOBIN: 9.6 g/dL — AB (ref 11.6–15.9)
LYMPH%: 18 % (ref 14.0–49.7)
MCH: 30.1 pg (ref 25.1–34.0)
MCHC: 33.1 g/dL (ref 31.5–36.0)
MCV: 91 fL (ref 79.5–101.0)
MONO#: 0.4 10*3/uL (ref 0.1–0.9)
MONO%: 11.6 % (ref 0.0–14.0)
NEUT%: 68.1 % (ref 38.4–76.8)
NEUTROS ABS: 2.3 10*3/uL (ref 1.5–6.5)
PLATELETS: 259 10*3/uL (ref 145–400)
RBC: 3.19 10*6/uL — ABNORMAL LOW (ref 3.70–5.45)
RDW: 17.7 % — ABNORMAL HIGH (ref 11.2–14.5)
WBC: 3.4 10*3/uL — ABNORMAL LOW (ref 3.9–10.3)
lymph#: 0.6 10*3/uL — ABNORMAL LOW (ref 0.9–3.3)

## 2014-06-01 LAB — COMPREHENSIVE METABOLIC PANEL (CC13)
ALBUMIN: 3.8 g/dL (ref 3.5–5.0)
ALT: 10 U/L (ref 0–55)
AST: 16 U/L (ref 5–34)
Alkaline Phosphatase: 52 U/L (ref 40–150)
Anion Gap: 8 mEq/L (ref 3–11)
BUN: 17.8 mg/dL (ref 7.0–26.0)
CO2: 28 mEq/L (ref 22–29)
Calcium: 9.3 mg/dL (ref 8.4–10.4)
Chloride: 108 mEq/L (ref 98–109)
Creatinine: 0.8 mg/dL (ref 0.6–1.1)
GLUCOSE: 93 mg/dL (ref 70–140)
Potassium: 4 mEq/L (ref 3.5–5.1)
Sodium: 144 mEq/L (ref 136–145)
Total Bilirubin: 0.41 mg/dL (ref 0.20–1.20)
Total Protein: 6.9 g/dL (ref 6.4–8.3)

## 2014-06-01 LAB — URINALYSIS, MICROSCOPIC - CHCC
BILIRUBIN (URINE): NEGATIVE
GLUCOSE UR CHCC: NEGATIVE mg/dL
KETONES: NEGATIVE mg/dL
Nitrite: NEGATIVE
Protein: NEGATIVE mg/dL
Specific Gravity, Urine: 1.03 (ref 1.003–1.035)
Urobilinogen, UR: 0.2 mg/dL (ref 0.2–1)
pH: 6 (ref 4.6–8.0)

## 2014-06-01 MED ORDER — HYDROMORPHONE HCL 2 MG PO TABS: 2.0000 mg | ORAL_TABLET | Freq: Four times a day (QID) | ORAL | Status: DC | PRN

## 2014-06-01 NOTE — Progress Notes (Signed)
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 With some peritoneal tumor still apparent on restaging CT done 05-23-14, Dr Josephina Shih has recommended addition of avastin to present carboplatin and taxol starting with cycle 4 of the chemotherapy on 06-03-14. She has had teaching for avastin by RN prior to this visit, has had questions answered to her satisfaction and is in agreement with this plan. She has felt well overall for almost 2 weeks, enjoying shopping with sister and wanting "to use leaf blower in my yard". See below re neuropathy. She had some urinary burning earlier this week, pushed fluids and that seems better however we will check urine specimen with these symptoms.  No nausea, colostomy functioning well, no SOB, no aching or other pain now.  She has PAC.  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 She was admitted thru ED 11-21-13 with [place] evidence of cholelithiasis, tho no signs of cholecystitis then. (Note patient tells me that she had CT in Jan, however this EMR has only abdominal US then). She was taken to laparoscopic cholecystectomy by Dr Hulen Skains on 11-22-13, with multiple large stones in specimen and benign pathology. Her post operative course was not remarkable and the pain initially resolved entirely, with patient feeling well at post op visit in mid Feb. By ~March she had more abdominal cramping and dull pain, with abdominal distension and was treated with cipro for apparent UTI. She  was seen in ED on 02-03-14 with concern for recurrent UTI, with abdominal symptoms as above + intermittent nausea and vomiting, with abdomen tender on exam then. CT AP found large heterogeneous mass lower mid abdomen/ pelvis 19.4 x 13.6 x 14.3 cm with soft tissue along pericolic gutters and left anterior abdomen, with mild bilateral hydronephrosis and small right pleural effusion. CA 125 resulted at 67.9 and CEA was 1.0. She saw Dr Alycia Rossetti on 02-09-14 and was hospitalized at [facility] from 02-11-14 thru 02-25-14. She went to exploratory laparotomy on 02-15-14, with findings of 20 cm mass filling pelvis and involving rectosigmoid and bilateral ovaries and tubes, with extension of tumor deep in pelvis and along rectum. Surgery was BSO/omentectomy and rectosigmoid resection with reanastomosis was performed, then diverting loop colostomy due to intraoperative concerns about the proximal rectal stump. She had bilateral ureterolysis for retroperitoneal fibrosis. At completion of procedure, which was optimal debulking, there were scattered <1 cm nodules on peritoneum, small bowel and small bowel mesentery, with no appreciable upper abdominal disease. She was transfused 2 units PRBCs intraoperatively. [facility] [phone] from 02-15-2014) found high grade carcinosarcoma 17 cm originating in one ovary which was adherent to multiple structures, metastatic disease in 1.8 cm peritoneal nodule as well as sigmoid colon nodule 1.5 cm and in omentum, no lymph nodes submitted, no identified LVSI, margin of one end of colon positive, The colon specimen also had adenomatous polyp without high grade dysplasia.  She was hospitalized at [facility] from 02-11-14 thru 02-25-14. Preoperatively she had acute kidney injury, with increase in creatinine from baseline 0.7 to high of 2.17; creatinine postoperatively was  1.52. She needed NG following surgery.  PAC was placed at [facility] on 02-21-14.  Recommendations from [facility] multidisciplinary conference 02-23-2014 was for  chemotherapy with platinum and taxane. She had cycle 1 taxol carboplatin on 7-82-95, complicated by severe taxol aches and by neutropenia day 11, gCSF support added. She has needed IVF/ IV antiemetics on ~ day 4 additionally. CT CAP 05-23-14 showed peritoneal involvement such that avastin is to be added to chemotherapy beginning cycle 4.   Review of systems as above, also: Slight numbness toes bilaterally noticeable at night, not bothersome with activity including wearing flipflops today. Very minimal tingling in fingertips noticeable but not bothersome when she writes. She was able to drink CT contrast. Slight burning with urination tho better since she has pushed fluids in last 24 hours, no fever. No bleeding. No LE swelling. Remainder of 10 point Review of Systems negative.  Objective:  Vital signs in last 24 hours:  BP 134/61  Pulse 80  Temp(Src) 99.2 F (37.3 C) (Oral)  Resp 18  Ht 5\' 7"  (1.702 m)  Wt 185 lb 12.8 oz (84.278 kg)  BMI 29.09 kg/m2 weight is up 3 lbs.   Alert, oriented and appropriate. Ambulatory without difficulty.  Total alopecia  HEENT:PERRL, sclerae not icteric. Oral mucosa moist without lesions, posterior pharynx clear.  Neck supple. No JVD.  Lymphatics:no suraclavicular or inguinal adenopathy Resp: clear to auscultation bilaterally and normal percussion bilaterally Cardio: regular rate and rhythm. No gallop. GI: soft, nontender, not distended, no mass or organomegaly. Normally active bowel sounds. Surgical incision not remarkable. Colostomy not remarkable. Musculoskeletal/ Extremities: without pitting edema, cords, tenderness Neuro: peripheral neuropathy as noted. Otherwise nonfocal. PSYCH normal mood and affect Skin without rash, ecchymosis, petechiae Portacath-without erythema or tenderness  Lab Results:  Results for orders placed in visit on 06/01/14  CBC WITH DIFFERENTIAL      Result Value Ref Range   WBC 3.4 (*) 3.9 - 10.3 10e3/uL   NEUT# 2.3  1.5  - 6.5 10e3/uL   HGB 9.6 (*) 11.6 - 15.9 g/dL   HCT 29.0 (*) 34.8 - 46.6 %   Platelets 259  145 - 400 10e3/uL   MCV 91.0  79.5 - 101.0 fL   MCH 30.1  25.1 - 34.0 pg   MCHC 33.1  31.5 - 36.0 g/dL   RBC 3.19 (*) 3.70 - 5.45 10e6/uL   RDW 17.7 (*) 11.2 - 14.5 %   lymph# 0.6 (*) 0.9 - 3.3 10e3/uL   MONO# 0.4  0.1 - 0.9 10e3/uL   Eosinophils Absolute 0.0  0.0 - 0.5 10e3/uL   Basophils Absolute 0.0  0.0 - 0.1 10e3/uL   NEUT% 68.1  38.4 - 76.8 %   LYMPH% 18.0  14.0 - 49.7 %   MONO% 11.6  0.0 - 14.0 %   EOS% 0.9  0.0 - 7.0 %   BASO% 1.4  0.0 - 2.0 %  COMPREHENSIVE METABOLIC PANEL (AO13)      Result Value Ref Range   Sodium 144  136 - 145 mEq/L   Potassium 4.0  3.5 - 5.1 mEq/L   Chloride 108  98 - 109 mEq/L   CO2 28  22 - 29 mEq/L   Glucose 93  70 - 140 mg/dl   BUN 17.8  7.0 - 26.0 mg/dL   Creatinine 0.8  0.6 - 1.1 mg/dL   Total Bilirubin 0.41  0.20 - 1.20 mg/dL   Alkaline Phosphatase 52  40 - 150 U/L   AST 16  5 - 34 U/L   ALT 10  0 - 55 U/L   Total Protein 6.9  6.4 - 8.3 g/dL   Albumin 3.8  3.5 - 5.0 g/dL   Calcium 9.3  8.4 - 10.4 mg/dL   Anion Gap 8  3 - 11 mEq/L   CA 125 on 05-27-14 down to 3.8   Will check urine with symptoms as described.  Studies/Results:  CT CHEST, ABDOMEN, AND PELVIS WITH CONTRAST 05-23-14  COMPARISON: Multiple exams, including 02/04/2014 and 08/29/2010 .  FINDINGS:  CT CHEST FINDINGS  Right thyroidectomy. Heterogeneous enhancement, left thyroid gland,  including a 1.2 x 1.1 cm hypodense lesion.  No pathologic thoracic adenopathy. The lungs appear clear.  CT ABDOMEN AND PELVIS FINDINGS  Unfortunately there are findings supportive of peritoneal spread of  tumor including a 2 cm thick rind of tumor along the inferomedial  portion of the right hepatic lobe the, nodularity in the pelvis  above the urinary bladder including a 1.9 x 1.1 by 1.2 cm nodule on  image 107 of series 2, and suspicion of mild nodularity along the  omentum and mesentery  including a 1.2 cm nodule on image 87 of  series 2 in the right anterior abdomen. Mild omental nodularity and  particularly on the left.  Bilateral perirenal cysts. No hydronephrosis No renal calculi  observed. Right kidney lower pole Bosniak category 1 parenchymal  cyst.  Left-sided colostomy. Descending and sigmoid colon diverticulosis.  Sigmoid anastomotic staple lines. Nodularity along the left pelvic  sidewall.  Uterus absent. The dominant pelvic mass is been removed.  IMPRESSION:  1. Peritoneal spread of tumor of most conspicuous along the inferior  margin of the liver, but also visible along the omentum, mesentery,  and along the roof of the bladder.  2. 1.2 x 1.1 cm hypodense left thyroid lesion. Consider further  evaluation with thyroid ultrasound. If patient is clinically  hyperthyroid, consider nuclear medicine thyroid uptake and scan.  Medications: I have reviewed the patient's current medications. We have discussed avastin indications, mechanism of action and possible side effects.  DISCUSSION: patient is encouraged by improvement in marker and recommendation for addition of avastin.   Assessment/Plan:  1.IIIC carcinosarcoma of ovary: post optimal debulking including rectosigmoid resection and diverting loop colostomy at [facility] 02-15-14. Evidence of peritoneal disease post 3 cycles of taxol carboplatin, with plan to add avastin beginning cycle 4 on 06-03-14. She will have IVF + IV antiemetics on day 4 and neupogen x 3 days beginning day 7 (delay due to severity of taxol aches). 2.post laparoscopic cholecystectomy Jan 2015  3.PAC placed at [facility].  4.hypothyroid on replacement, post partial thyroidectomy  5.HTN  6.acute renal insufficiency around recent surgery, resolved.  7.no intolerance to zofran  8. Anemia: needed PRBCs around surgery, not obviously symptomatic now. Iron studies not low on 04-15-14. Follow. 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

## 2014-06-02 LAB — URINE CULTURE

## 2014-06-03 ENCOUNTER — Other Ambulatory Visit (HOSPITAL_BASED_OUTPATIENT_CLINIC_OR_DEPARTMENT_OTHER): Payer: Medicare Other

## 2014-06-03 ENCOUNTER — Ambulatory Visit: Payer: Medicare Other

## 2014-06-03 ENCOUNTER — Ambulatory Visit (HOSPITAL_BASED_OUTPATIENT_CLINIC_OR_DEPARTMENT_OTHER): Payer: Medicare Other

## 2014-06-03 ENCOUNTER — Telehealth: Payer: Self-pay

## 2014-06-03 VITALS — BP 129/64 | HR 80 | Temp 97.3°F | Resp 18

## 2014-06-03 DIAGNOSIS — Z5112 Encounter for antineoplastic immunotherapy: Secondary | ICD-10-CM

## 2014-06-03 DIAGNOSIS — C569 Malignant neoplasm of unspecified ovary: Secondary | ICD-10-CM

## 2014-06-03 DIAGNOSIS — R300 Dysuria: Secondary | ICD-10-CM

## 2014-06-03 DIAGNOSIS — C561 Malignant neoplasm of right ovary: Secondary | ICD-10-CM

## 2014-06-03 DIAGNOSIS — Z5111 Encounter for antineoplastic chemotherapy: Secondary | ICD-10-CM

## 2014-06-03 LAB — UA PROTEIN, DIPSTICK - CHCC: Protein, ur: <30 mg/dL

## 2014-06-03 MED ORDER — DEXAMETHASONE SODIUM PHOSPHATE 20 MG/5ML IJ SOLN
12.0000 mg | Freq: Once | INTRAMUSCULAR | Status: AC
  Administered 2014-06-03: 12 mg via INTRAVENOUS

## 2014-06-03 MED ORDER — SODIUM CHLORIDE 0.9 % IV SOLN
Freq: Once | INTRAVENOUS | Status: AC
  Administered 2014-06-03: 10:00:00 via INTRAVENOUS

## 2014-06-03 MED ORDER — FAMOTIDINE IN NACL 20-0.9 MG/50ML-% IV SOLN
20.0000 mg | Freq: Once | INTRAVENOUS | Status: AC
  Administered 2014-06-03: 20 mg via INTRAVENOUS

## 2014-06-03 MED ORDER — ONDANSETRON 16 MG/50ML IVPB (CHCC)
16.0000 mg | Freq: Once | INTRAVENOUS | Status: AC
  Administered 2014-06-03: 16 mg via INTRAVENOUS

## 2014-06-03 MED ORDER — DIPHENHYDRAMINE HCL 50 MG/ML IJ SOLN
25.0000 mg | Freq: Once | INTRAMUSCULAR | Status: AC
  Administered 2014-06-03: 25 mg via INTRAVENOUS

## 2014-06-03 MED ORDER — LORAZEPAM 1 MG PO TABS
ORAL_TABLET | ORAL | Status: AC
  Filled 2014-06-03: qty 1

## 2014-06-03 MED ORDER — LORAZEPAM 1 MG PO TABS
0.5000 mg | ORAL_TABLET | ORAL | Status: DC
  Administered 2014-06-03: 0.5 mg via ORAL

## 2014-06-03 MED ORDER — SODIUM CHLORIDE 0.9 % IJ SOLN
10.0000 mL | INTRAMUSCULAR | Status: DC | PRN
  Administered 2014-06-03: 10 mL
  Filled 2014-06-03: qty 10

## 2014-06-03 MED ORDER — DEXAMETHASONE SODIUM PHOSPHATE 20 MG/5ML IJ SOLN
INTRAMUSCULAR | Status: AC
  Filled 2014-06-03: qty 5

## 2014-06-03 MED ORDER — IBUPROFEN 200 MG PO TABS
ORAL_TABLET | ORAL | Status: AC
  Filled 2014-06-03: qty 2

## 2014-06-03 MED ORDER — FAMOTIDINE IN NACL 20-0.9 MG/50ML-% IV SOLN
INTRAVENOUS | Status: AC
  Filled 2014-06-03: qty 50

## 2014-06-03 MED ORDER — PACLITAXEL CHEMO INJECTION 300 MG/50ML
175.0000 mg/m2 | Freq: Once | INTRAVENOUS | Status: AC
  Administered 2014-06-03: 348 mg via INTRAVENOUS
  Filled 2014-06-03: qty 58

## 2014-06-03 MED ORDER — SODIUM CHLORIDE 0.9 % IV SOLN
15.0000 mg/kg | Freq: Once | INTRAVENOUS | Status: AC
  Administered 2014-06-03: 1250 mg via INTRAVENOUS
  Filled 2014-06-03: qty 50

## 2014-06-03 MED ORDER — SODIUM CHLORIDE 0.9 % IV SOLN
150.0000 mg | Freq: Once | INTRAVENOUS | Status: AC
  Administered 2014-06-03: 150 mg via INTRAVENOUS
  Filled 2014-06-03: qty 5

## 2014-06-03 MED ORDER — SODIUM CHLORIDE 0.9 % IV SOLN
470.0000 mg | Freq: Once | INTRAVENOUS | Status: AC
  Administered 2014-06-03: 470 mg via INTRAVENOUS
  Filled 2014-06-03: qty 47

## 2014-06-03 MED ORDER — DIPHENHYDRAMINE HCL 50 MG/ML IJ SOLN
INTRAMUSCULAR | Status: AC
  Filled 2014-06-03: qty 1

## 2014-06-03 MED ORDER — HEPARIN SOD (PORK) LOCK FLUSH 100 UNIT/ML IV SOLN
500.0000 [IU] | Freq: Once | INTRAVENOUS | Status: AC | PRN
  Administered 2014-06-03: 500 [IU]
  Filled 2014-06-03: qty 5

## 2014-06-03 MED ORDER — ONDANSETRON 16 MG/50ML IVPB (CHCC)
INTRAVENOUS | Status: AC
  Filled 2014-06-03: qty 16

## 2014-06-03 MED ORDER — IBUPROFEN 200 MG PO TABS
400.0000 mg | ORAL_TABLET | Freq: Once | ORAL | Status: AC
  Administered 2014-06-03: 400 mg via ORAL

## 2014-06-03 NOTE — Telephone Encounter (Signed)
Message copied by Baruch Merl on Fri Jun 03, 2014  6:49 PM ------      Message from: Gordy Levan      Created: Thu Jun 02, 2014  7:41 PM       Labs seen and need follow up: please let her know that she does not have UTI ------

## 2014-06-03 NOTE — Telephone Encounter (Signed)
Told Ms. Elsbernd the results of the urine culture as noted below by Dr. Marko Plume.

## 2014-06-03 NOTE — Patient Instructions (Signed)
[facility] Discharge Instructions for Patients Receiving Chemotherapy  Today you received the following chemotherapy agents: Taxol, Carbo, and Avastin.  To help prevent nausea and vomiting after your treatment, we encourage you to take your nausea medication as prescribed.   If you develop nausea and vomiting that is not controlled by your nausea medication, call the clinic.   BELOW ARE SYMPTOMS THAT SHOULD BE REPORTED IMMEDIATELY:  *FEVER GREATER THAN 100.5 F  *CHILLS WITH OR WITHOUT FEVER  NAUSEA AND VOMITING THAT IS NOT CONTROLLED WITH YOUR NAUSEA MEDICATION  *UNUSUAL SHORTNESS OF BREATH  *UNUSUAL BRUISING OR BLEEDING  TENDERNESS IN MOUTH AND THROAT WITH OR WITHOUT PRESENCE OF ULCERS  *URINARY PROBLEMS  *BOWEL PROBLEMS  UNUSUAL RASH Items with * indicate a potential emergency and should be followed up as soon as possible.  Feel free to call the clinic should you have any questions or concerns. The clinic phone number is [phone].

## 2014-06-03 NOTE — Progress Notes (Signed)
@   1415-reports some aches in her knees and thighs and elbows rated 3/10. Verbal order for Ibuprofen 400 mg obtained by Burns Spain, NP. @ 1525--reports her aches have resolved.

## 2014-06-06 ENCOUNTER — Telehealth: Payer: Self-pay | Admitting: Oncology

## 2014-06-06 ENCOUNTER — Ambulatory Visit (HOSPITAL_BASED_OUTPATIENT_CLINIC_OR_DEPARTMENT_OTHER): Payer: Medicare Other

## 2014-06-06 VITALS — BP 144/65 | HR 60 | Temp 98.2°F | Resp 18

## 2014-06-06 DIAGNOSIS — C569 Malignant neoplasm of unspecified ovary: Secondary | ICD-10-CM

## 2014-06-06 MED ORDER — DEXAMETHASONE SODIUM PHOSPHATE 10 MG/ML IJ SOLN
4.0000 mg | Freq: Once | INTRAMUSCULAR | Status: AC | PRN
  Administered 2014-06-06: 4 mg via INTRAVENOUS

## 2014-06-06 MED ORDER — SODIUM CHLORIDE 0.9 % IJ SOLN
10.0000 mL | INTRAMUSCULAR | Status: DC | PRN
  Administered 2014-06-06: 10 mL via INTRAVENOUS
  Filled 2014-06-06: qty 10

## 2014-06-06 MED ORDER — DEXAMETHASONE SODIUM PHOSPHATE 10 MG/ML IJ SOLN
INTRAMUSCULAR | Status: AC
  Filled 2014-06-06: qty 1

## 2014-06-06 MED ORDER — HEPARIN SOD (PORK) LOCK FLUSH 100 UNIT/ML IV SOLN
500.0000 [IU] | Freq: Once | INTRAVENOUS | Status: AC
  Administered 2014-06-06: 500 [IU] via INTRAVENOUS
  Filled 2014-06-06: qty 5

## 2014-06-06 MED ORDER — SODIUM CHLORIDE 0.9 % IV SOLN
Freq: Once | INTRAVENOUS | Status: AC
  Administered 2014-06-06: 10:00:00 via INTRAVENOUS

## 2014-06-06 MED ORDER — ONDANSETRON 16 MG/50ML IVPB (CHCC)
16.0000 mg | Freq: Once | INTRAVENOUS | Status: AC | PRN
  Administered 2014-06-06: 16 mg via INTRAVENOUS

## 2014-06-06 MED ORDER — ONDANSETRON 16 MG/50ML IVPB (CHCC)
INTRAVENOUS | Status: AC
  Filled 2014-06-06: qty 16

## 2014-06-06 NOTE — Telephone Encounter (Signed)
S/w pt, advised appts added on 8/17. Asked pt to collect a new appt calendar when she comes in on 813.

## 2014-06-09 ENCOUNTER — Ambulatory Visit (HOSPITAL_BASED_OUTPATIENT_CLINIC_OR_DEPARTMENT_OTHER): Payer: Medicare Other

## 2014-06-09 VITALS — BP 132/57 | HR 63 | Temp 97.7°F

## 2014-06-09 DIAGNOSIS — C569 Malignant neoplasm of unspecified ovary: Secondary | ICD-10-CM

## 2014-06-09 DIAGNOSIS — Z5189 Encounter for other specified aftercare: Secondary | ICD-10-CM

## 2014-06-09 MED ORDER — TBO-FILGRASTIM 300 MCG/0.5ML ~~LOC~~ SOSY
300.0000 ug | PREFILLED_SYRINGE | Freq: Once | SUBCUTANEOUS | Status: AC
  Administered 2014-06-09: 300 ug via SUBCUTANEOUS
  Filled 2014-06-09: qty 0.5

## 2014-06-10 ENCOUNTER — Ambulatory Visit (HOSPITAL_BASED_OUTPATIENT_CLINIC_OR_DEPARTMENT_OTHER): Payer: Medicare Other

## 2014-06-10 VITALS — BP 129/68 | HR 77 | Temp 97.8°F

## 2014-06-10 DIAGNOSIS — C569 Malignant neoplasm of unspecified ovary: Secondary | ICD-10-CM

## 2014-06-10 DIAGNOSIS — Z5189 Encounter for other specified aftercare: Secondary | ICD-10-CM

## 2014-06-10 MED ORDER — TBO-FILGRASTIM 300 MCG/0.5ML ~~LOC~~ SOSY
300.0000 ug | PREFILLED_SYRINGE | Freq: Once | SUBCUTANEOUS | Status: AC
  Administered 2014-06-10: 300 ug via SUBCUTANEOUS
  Filled 2014-06-10: qty 0.5

## 2014-06-11 ENCOUNTER — Ambulatory Visit (HOSPITAL_BASED_OUTPATIENT_CLINIC_OR_DEPARTMENT_OTHER): Payer: Medicare Other

## 2014-06-11 VITALS — BP 140/79 | HR 73 | Temp 98.2°F | Resp 20

## 2014-06-11 DIAGNOSIS — C569 Malignant neoplasm of unspecified ovary: Secondary | ICD-10-CM

## 2014-06-11 DIAGNOSIS — Z5189 Encounter for other specified aftercare: Secondary | ICD-10-CM

## 2014-06-11 MED ORDER — TBO-FILGRASTIM 300 MCG/0.5ML ~~LOC~~ SOSY
300.0000 ug | PREFILLED_SYRINGE | Freq: Once | SUBCUTANEOUS | Status: AC
  Administered 2014-06-11: 300 ug via SUBCUTANEOUS
  Filled 2014-06-11: qty 0.5

## 2014-06-12 ENCOUNTER — Other Ambulatory Visit: Payer: Self-pay | Admitting: Oncology

## 2014-06-12 DIAGNOSIS — C569 Malignant neoplasm of unspecified ovary: Secondary | ICD-10-CM

## 2014-06-13 ENCOUNTER — Other Ambulatory Visit (HOSPITAL_BASED_OUTPATIENT_CLINIC_OR_DEPARTMENT_OTHER): Payer: Medicare Other

## 2014-06-13 ENCOUNTER — Encounter: Payer: Self-pay | Admitting: Oncology

## 2014-06-13 ENCOUNTER — Ambulatory Visit (HOSPITAL_BASED_OUTPATIENT_CLINIC_OR_DEPARTMENT_OTHER): Payer: Medicare Other | Admitting: Oncology

## 2014-06-13 VITALS — BP 138/63 | HR 73 | Temp 98.3°F | Resp 20 | Ht 67.0 in | Wt 181.4 lb

## 2014-06-13 DIAGNOSIS — C569 Malignant neoplasm of unspecified ovary: Secondary | ICD-10-CM

## 2014-06-13 DIAGNOSIS — C561 Malignant neoplasm of right ovary: Secondary | ICD-10-CM

## 2014-06-13 DIAGNOSIS — D649 Anemia, unspecified: Secondary | ICD-10-CM

## 2014-06-13 DIAGNOSIS — E039 Hypothyroidism, unspecified: Secondary | ICD-10-CM

## 2014-06-13 DIAGNOSIS — I10 Essential (primary) hypertension: Secondary | ICD-10-CM

## 2014-06-13 LAB — CBC WITH DIFFERENTIAL/PLATELET
BASO%: 0.2 % (ref 0.0–2.0)
Basophils Absolute: 0 10*3/uL (ref 0.0–0.1)
EOS%: 2.1 % (ref 0.0–7.0)
Eosinophils Absolute: 0.1 10*3/uL (ref 0.0–0.5)
HEMATOCRIT: 31.4 % — AB (ref 34.8–46.6)
HGB: 10.5 g/dL — ABNORMAL LOW (ref 11.6–15.9)
LYMPH%: 14.5 % (ref 14.0–49.7)
MCH: 30.3 pg (ref 25.1–34.0)
MCHC: 33.5 g/dL (ref 31.5–36.0)
MCV: 90.7 fL (ref 79.5–101.0)
MONO#: 1.3 10*3/uL — AB (ref 0.1–0.9)
MONO%: 29.1 % — ABNORMAL HIGH (ref 0.0–14.0)
NEUT#: 2.4 10*3/uL (ref 1.5–6.5)
NEUT%: 54.1 % (ref 38.4–76.8)
Platelets: 204 10*3/uL (ref 145–400)
RBC: 3.46 10*6/uL — AB (ref 3.70–5.45)
RDW: 16.6 % — AB (ref 11.2–14.5)
WBC: 4.4 10*3/uL (ref 3.9–10.3)
lymph#: 0.6 10*3/uL — ABNORMAL LOW (ref 0.9–3.3)

## 2014-06-13 MED ORDER — DEXAMETHASONE 4 MG PO TABS: ORAL_TABLET | ORAL | Status: DC

## 2014-06-13 NOTE — Progress Notes (Signed)
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 Patient continues to tolerate treatment, especially with additional IVF on day 4 and neupogen x 3 days beginning day 7 (due to taxol aches); she did not have any specific problems with the avastin. She has not needed any antiemetics recently. She had more aches by day day 3 of neupogen. She notices slight numbness in toes at night, tho this is not bothersome walking or when she is up during day; she has only trace numbness in fingertips not bothersome. She has increased output from ostomy initially after last chemo, now back to baseline, no blood or pain. She has not had other bleeding.  She has PAC  ONCOLOGIC HISTORY   Ovarian carcinosarcoma   02/09/2014 Initial Diagnosis Ovarian carcinosarcoma   02/15/2014 Surgery IIIC MMMT ovary   03/25/2014 -  Chemotherapy paclitaxel and carboplatin  Patient had been in usual generally good health, status post remote hysterectomy, until abdominal pain with radiation to right shoulder in Jan 2015. She was admitted thru ED 11-21-13 with [place] evidence of cholelithiasis, tho no signs of cholecystitis then. (Note patient tells me that she had CT in Jan, however this EMR has only abdominal US then). She was taken to laparoscopic cholecystectomy by Dr Hulen Skains on 11-22-13, with multiple large stones in specimen and benign pathology. Her post operative course was not remarkable and the pain initially resolved entirely, with patient feeling well at post op visit in mid Feb. By ~March she had more abdominal cramping and dull pain, with abdominal distension and was treated with cipro for apparent UTI. She was seen in ED on 02-03-14 with concern for recurrent UTI, with abdominal  symptoms as above + intermittent nausea and vomiting, with abdomen tender on exam then. CT AP found large heterogeneous mass lower mid abdomen/ pelvis 19.4 x 13.6 x 14.3 cm with soft tissue along pericolic gutters and left anterior abdomen, with mild bilateral hydronephrosis and small right pleural effusion. CA 125 resulted at 67.9 and CEA was 1.0. She saw Dr Alycia Rossetti on 02-09-14 and was hospitalized at [facility] from 02-11-14 thru 02-25-14. She went to exploratory laparotomy on 02-15-14, with findings of 20 cm mass filling pelvis and involving rectosigmoid and bilateral ovaries and tubes, with extension of tumor deep in pelvis and along rectum. Surgery was BSO/omentectomy and rectosigmoid resection with reanastomosis was performed, then diverting loop colostomy due to intraoperative concerns about the proximal rectal stump. She had bilateral ureterolysis for retroperitoneal fibrosis. At completion of procedure, which was optimal debulking, there were scattered <1 cm nodules on peritoneum, small bowel and small bowel mesentery, with no appreciable upper abdominal disease. She was transfused 2 units PRBCs intraoperatively. [facility] [phone] from 02-15-2014) found high grade carcinosarcoma 17 cm originating in one ovary which was adherent to multiple structures, metastatic disease in 1.8 cm peritoneal nodule as well as sigmoid colon nodule 1.5 cm and in omentum, no lymph nodes submitted, no identified LVSI, margin of one end of colon positive, The colon specimen also had adenomatous polyp without high grade dysplasia.  She was hospitalized at [facility] from 02-11-14 thru 02-25-14. Preoperatively she had acute kidney injury, with increase in creatinine from baseline 0.7 to high of 2.17; creatinine postoperatively was 1.52. She needed NG following surgery.  PAC was placed at [facility] on 02-21-14.  Recommendations from [facility] multidisciplinary conference 02-23-2014 was for chemotherapy with platinum and taxane. She had cycle 1 taxol  carboplatin on 8-46-96, complicated by severe taxol aches and by neutropenia day 11, gCSF support added. She has needed IVF/ IV antiemetics on ~ day 4 additionally.  CT CAP 05-23-14 showed peritoneal involvement such that avastin is to be added to chemotherapy beginning cycle 4.   Review of systems as above, also: No fever or symptoms of infection. Sores on tip of tongue after chemo, resolved now. No LE swelling. No respiratory problems. No new or different pain. Energy ok this week. Remainder of 10 point Review of Systems negative.  Objective:  Vital signs in last 24 hours:  BP 138/63  Pulse 73  Temp(Src) 98.3 F (36.8 C) (Oral)  Resp 20  Ht 5\' 7"  (1.702 m)  Wt 181 lb 6.4 oz (82.283 kg)  BMI 28.40 kg/m2  Alert, oriented and appropriate. Ambulatory without difficulty.  Alopecia  HEENT:PERRL, sclerae not icteric. Oral mucosa moist without lesions, no ulcers on tongue now, posterior pharynx clear.  Neck supple. No JVD.  Lymphatics:no cervical,suraclavicular, axillary or inguinal adenopathy Resp: clear to auscultation bilaterally and normal percussion bilaterally Cardio: regular rate and rhythm. No gallop. GI: soft, nontender, not distended, no mass or organomegaly. Normally active bowel sounds. Surgical incision not remarkable. Ostomy not remarkable. Musculoskeletal/ Extremities: without pitting edema, cords, tenderness Neuro: no significant peripheral neuropathy. Otherwise nonfocal.PSYCH normal mood and affect. Skin without rash, ecchymosis, petechiae Portacath-without erythema or tenderness  Lab Results:  Results for orders placed in visit on 06/13/14  CBC WITH DIFFERENTIAL      Result Value Ref Range   WBC 4.4  3.9 - 10.3 10e3/uL   NEUT# 2.4  1.5 - 6.5 10e3/uL   HGB 10.5 (*) 11.6 - 15.9 g/dL   HCT 31.4 (*) 34.8 - 46.6 %   Platelets 204  145 - 400 10e3/uL   MCV 90.7  79.5 - 101.0 fL   MCH 30.3  25.1 - 34.0 pg   MCHC 33.5  31.5 - 36.0 g/dL   RBC 3.46 (*) 3.70 - 5.45  10e6/uL   RDW 16.6 (*) 11.2 - 14.5 %   lymph# 0.6 (*) 0.9 - 3.3 10e3/uL   MONO# 1.3 (*) 0.1 - 0.9 10e3/uL   Eosinophils Absolute 0.1  0.0 - 0.5 10e3/uL   Basophils Absolute 0.0  0.0 - 0.1 10e3/uL   NEUT% 54.1  38.4 - 76.8 %   LYMPH% 14.5  14.0 - 49.7 %   MONO% 29.1 (*) 0.0 - 14.0 %   EOS% 2.1  0.0 - 7.0 %   BASO% 0.2  0.0 - 2.0 %     Studies/Results:  No results found.  Medications: I have reviewed the patient's current medications. She will discuss prn topical for tongue with dentist/ her daughter who is Art therapist.  DISCUSSION: at patient's request, I reviewed [facility] information available thru this EMR - no mention of incidental appendectomy in operative information or path report that I find. Discussed lots of movement and massage feet and hands. Reviewed likelihood of slight nose bleeding often seen with avastin.  Assessment/Plan: 1.IIIC carcinosarcoma of ovary: post optimal debulking including rectosigmoid resection and diverting loop colostomy at [facility] 02-15-14. Evidence of peritoneal disease post 3 cycles of taxol carboplatin, avastin added beginning cycle 4 on 06-03-14.  IVF + IV antiemetics on day 4 and neupogen x 3 days beginning day 7 (delay due to severity  of taxol aches) have been helpful and will continue. I will see her back with labs 8-26 prior to cycle 5 carbo taxol avastin on 06-24-14.  2.post laparoscopic cholecystectomy Jan 2015. I do not believe that appendix was removed with gyn onc surgery (?) 3.PAC placed at [facility].  4.hypothyroid on replacement, post partial thyroidectomy  5.HTN  6.acute renal insufficiency around recent surgery, resolved.  7.no intolerance to zofran  8. Anemia: needed PRBCs around surgery, not obviously symptomatic now. Iron studies not low on 04-15-14. Follow.  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 Tongue soreness after last chemo: biotene and/or baking soda mouthwash, + other topicals per dentist. No obvious  thrush.   All questions answered to her satisfaction and she is in agreement with plan. TIme spent 25 min including >50% counseling and coordination of care.    Hardeep Reetz P, MD   06/13/2014, 1:10 PM

## 2014-06-19 ENCOUNTER — Other Ambulatory Visit: Payer: Self-pay | Admitting: Oncology

## 2014-06-19 DIAGNOSIS — C569 Malignant neoplasm of unspecified ovary: Secondary | ICD-10-CM

## 2014-06-21 ENCOUNTER — Telehealth: Payer: Self-pay | Admitting: Oncology

## 2014-06-21 NOTE — Telephone Encounter (Signed)
pt cld to CX appt for 8/26 stated that LL stated her to CX if she felt ok/pt stated felt fine and will CX trsnd to [place] for addtl questions

## 2014-06-22 ENCOUNTER — Ambulatory Visit: Payer: Medicare Other | Admitting: Oncology

## 2014-06-22 ENCOUNTER — Telehealth: Payer: Self-pay

## 2014-06-22 ENCOUNTER — Ambulatory Visit (HOSPITAL_BASED_OUTPATIENT_CLINIC_OR_DEPARTMENT_OTHER): Payer: Medicare Other

## 2014-06-22 DIAGNOSIS — C561 Malignant neoplasm of right ovary: Secondary | ICD-10-CM

## 2014-06-22 DIAGNOSIS — I10 Essential (primary) hypertension: Secondary | ICD-10-CM

## 2014-06-22 DIAGNOSIS — C569 Malignant neoplasm of unspecified ovary: Secondary | ICD-10-CM

## 2014-06-22 LAB — COMPREHENSIVE METABOLIC PANEL (CC13)
ALBUMIN: 3.8 g/dL (ref 3.5–5.0)
ALK PHOS: 56 U/L (ref 40–150)
ALT: 12 U/L (ref 0–55)
AST: 16 U/L (ref 5–34)
Anion Gap: 9 mEq/L (ref 3–11)
BUN: 22.8 mg/dL (ref 7.0–26.0)
CHLORIDE: 108 meq/L (ref 98–109)
CO2: 25 mEq/L (ref 22–29)
CREATININE: 0.9 mg/dL (ref 0.6–1.1)
Calcium: 9.1 mg/dL (ref 8.4–10.4)
Glucose: 108 mg/dl (ref 70–140)
POTASSIUM: 4.2 meq/L (ref 3.5–5.1)
Sodium: 142 mEq/L (ref 136–145)
Total Bilirubin: 0.31 mg/dL (ref 0.20–1.20)
Total Protein: 6.8 g/dL (ref 6.4–8.3)

## 2014-06-22 LAB — UA PROTEIN, DIPSTICK - CHCC: Protein, ur: NEGATIVE mg/dL

## 2014-06-22 LAB — CBC WITH DIFFERENTIAL/PLATELET
BASO%: 2 % (ref 0.0–2.0)
Basophils Absolute: 0.1 10*3/uL (ref 0.0–0.1)
EOS%: 1 % (ref 0.0–7.0)
Eosinophils Absolute: 0 10*3/uL (ref 0.0–0.5)
HCT: 30.4 % — ABNORMAL LOW (ref 34.8–46.6)
HGB: 10.2 g/dL — ABNORMAL LOW (ref 11.6–15.9)
LYMPH#: 0.8 10*3/uL — AB (ref 0.9–3.3)
LYMPH%: 23.8 % (ref 14.0–49.7)
MCH: 30.3 pg (ref 25.1–34.0)
MCHC: 33.6 g/dL (ref 31.5–36.0)
MCV: 90.2 fL (ref 79.5–101.0)
MONO#: 0.3 10*3/uL (ref 0.1–0.9)
MONO%: 9.7 % (ref 0.0–14.0)
NEUT#: 2 10*3/uL (ref 1.5–6.5)
NEUT%: 63.5 % (ref 38.4–76.8)
Platelets: 217 10*3/uL (ref 145–400)
RBC: 3.37 10*6/uL — AB (ref 3.70–5.45)
RDW: 16.1 % — AB (ref 11.2–14.5)
WBC: 3.2 10*3/uL — ABNORMAL LOW (ref 3.9–10.3)

## 2014-06-22 NOTE — Telephone Encounter (Signed)
Told [name] that her cbc, urine protein, and c-met from today was fine for her treatment Friday 06-24-14.

## 2014-06-23 ENCOUNTER — Other Ambulatory Visit: Payer: Self-pay | Admitting: Oncology

## 2014-06-23 ENCOUNTER — Telehealth: Payer: Self-pay | Admitting: Oncology

## 2014-06-23 DIAGNOSIS — C569 Malignant neoplasm of unspecified ovary: Secondary | ICD-10-CM

## 2014-06-23 LAB — CA 125: CA 125: 5 U/mL (ref <35)

## 2014-06-23 LAB — CA 125(PREVIOUS METHOD): CA 125: 4.3 U/mL (ref 0.0–30.2)

## 2014-06-23 NOTE — Telephone Encounter (Signed)
Cld pt & phone states not taking calls @ this time-mailed pt copy of [place]

## 2014-06-24 ENCOUNTER — Other Ambulatory Visit: Payer: Medicare Other

## 2014-06-24 ENCOUNTER — Ambulatory Visit (HOSPITAL_BASED_OUTPATIENT_CLINIC_OR_DEPARTMENT_OTHER): Payer: Medicare Other

## 2014-06-24 VITALS — BP 132/67 | HR 87 | Temp 98.0°F | Resp 20

## 2014-06-24 DIAGNOSIS — C561 Malignant neoplasm of right ovary: Secondary | ICD-10-CM

## 2014-06-24 DIAGNOSIS — Z5111 Encounter for antineoplastic chemotherapy: Secondary | ICD-10-CM

## 2014-06-24 DIAGNOSIS — Z5112 Encounter for antineoplastic immunotherapy: Secondary | ICD-10-CM

## 2014-06-24 DIAGNOSIS — C569 Malignant neoplasm of unspecified ovary: Secondary | ICD-10-CM

## 2014-06-24 MED ORDER — DEXAMETHASONE SODIUM PHOSPHATE 20 MG/5ML IJ SOLN
INTRAMUSCULAR | Status: AC
  Filled 2014-06-24: qty 5

## 2014-06-24 MED ORDER — SODIUM CHLORIDE 0.9 % IJ SOLN
10.0000 mL | INTRAMUSCULAR | Status: DC | PRN
  Administered 2014-06-24: 10 mL
  Filled 2014-06-24: qty 10

## 2014-06-24 MED ORDER — DIPHENHYDRAMINE HCL 25 MG PO CAPS
ORAL_CAPSULE | ORAL | Status: AC
  Filled 2014-06-24: qty 1

## 2014-06-24 MED ORDER — PACLITAXEL CHEMO INJECTION 300 MG/50ML
175.0000 mg/m2 | Freq: Once | INTRAVENOUS | Status: AC
  Administered 2014-06-24: 348 mg via INTRAVENOUS
  Filled 2014-06-24: qty 58

## 2014-06-24 MED ORDER — SODIUM CHLORIDE 0.9 % IV SOLN
15.0000 mg/kg | Freq: Once | INTRAVENOUS | Status: AC
  Administered 2014-06-24: 1250 mg via INTRAVENOUS
  Filled 2014-06-24: qty 50

## 2014-06-24 MED ORDER — ONDANSETRON 16 MG/50ML IVPB (CHCC)
INTRAVENOUS | Status: AC
  Filled 2014-06-24: qty 16

## 2014-06-24 MED ORDER — ONDANSETRON 16 MG/50ML IVPB (CHCC)
16.0000 mg | Freq: Once | INTRAVENOUS | Status: AC
  Administered 2014-06-24: 16 mg via INTRAVENOUS

## 2014-06-24 MED ORDER — LORAZEPAM 1 MG PO TABS
ORAL_TABLET | ORAL | Status: AC
  Filled 2014-06-24: qty 1

## 2014-06-24 MED ORDER — DIPHENHYDRAMINE HCL 50 MG/ML IJ SOLN: 25.0000 mg | Freq: Once | INTRAMUSCULAR | Status: DC

## 2014-06-24 MED ORDER — HEPARIN SOD (PORK) LOCK FLUSH 100 UNIT/ML IV SOLN
500.0000 [IU] | Freq: Once | INTRAVENOUS | Status: AC | PRN
  Administered 2014-06-24: 500 [IU]
  Filled 2014-06-24: qty 5

## 2014-06-24 MED ORDER — FAMOTIDINE IN NACL 20-0.9 MG/50ML-% IV SOLN
INTRAVENOUS | Status: AC
  Filled 2014-06-24: qty 50

## 2014-06-24 MED ORDER — DIPHENHYDRAMINE HCL 50 MG/ML IJ SOLN
INTRAMUSCULAR | Status: AC
  Filled 2014-06-24: qty 1

## 2014-06-24 MED ORDER — DEXAMETHASONE SODIUM PHOSPHATE 20 MG/5ML IJ SOLN
12.0000 mg | Freq: Once | INTRAMUSCULAR | Status: AC
  Administered 2014-06-24: 12 mg via INTRAVENOUS

## 2014-06-24 MED ORDER — SODIUM CHLORIDE 0.9 % IV SOLN
Freq: Once | INTRAVENOUS | Status: AC
  Administered 2014-06-24: 10:00:00 via INTRAVENOUS

## 2014-06-24 MED ORDER — SODIUM CHLORIDE 0.9 % IV SOLN
473.5000 mg | Freq: Once | INTRAVENOUS | Status: AC
  Administered 2014-06-24: 470 mg via INTRAVENOUS
  Filled 2014-06-24: qty 47

## 2014-06-24 MED ORDER — DIPHENHYDRAMINE HCL 25 MG PO CAPS
25.0000 mg | ORAL_CAPSULE | Freq: Once | ORAL | Status: AC
  Administered 2014-06-24: 25 mg via ORAL

## 2014-06-24 MED ORDER — LORAZEPAM 1 MG PO TABS
0.5000 mg | ORAL_TABLET | ORAL | Status: DC
  Administered 2014-06-24: 0.5 mg via ORAL

## 2014-06-24 MED ORDER — FAMOTIDINE IN NACL 20-0.9 MG/50ML-% IV SOLN
20.0000 mg | Freq: Once | INTRAVENOUS | Status: AC
  Administered 2014-06-24: 20 mg via INTRAVENOUS

## 2014-06-24 MED ORDER — SODIUM CHLORIDE 0.9 % IV SOLN
150.0000 mg | Freq: Once | INTRAVENOUS | Status: AC
  Administered 2014-06-24: 150 mg via INTRAVENOUS
  Filled 2014-06-24: qty 5

## 2014-06-24 NOTE — Patient Instructions (Signed)
[facility] Discharge Instructions for Patients Receiving Chemotherapy  Today you received the following chemotherapy agents:  Avastin, Taxol and Carboplatin  To help prevent nausea and vomiting after your treatment, we encourage you to take your nausea medication as ordered per MD.   If you develop nausea and vomiting that is not controlled by your nausea medication, call the clinic.   BELOW ARE SYMPTOMS THAT SHOULD BE REPORTED IMMEDIATELY:  *FEVER GREATER THAN 100.5 F  *CHILLS WITH OR WITHOUT FEVER  NAUSEA AND VOMITING THAT IS NOT CONTROLLED WITH YOUR NAUSEA MEDICATION  *UNUSUAL SHORTNESS OF BREATH  *UNUSUAL BRUISING OR BLEEDING  TENDERNESS IN MOUTH AND THROAT WITH OR WITHOUT PRESENCE OF ULCERS  *URINARY PROBLEMS  *BOWEL PROBLEMS  UNUSUAL RASH Items with * indicate a potential emergency and should be followed up as soon as possible.  Feel free to call the clinic you have any questions or concerns. The clinic phone number is [phone].

## 2014-06-27 ENCOUNTER — Ambulatory Visit (HOSPITAL_BASED_OUTPATIENT_CLINIC_OR_DEPARTMENT_OTHER): Payer: Medicare Other

## 2014-06-27 ENCOUNTER — Other Ambulatory Visit: Payer: Medicare Other

## 2014-06-27 VITALS — BP 129/64 | HR 60 | Temp 98.2°F | Resp 18

## 2014-06-27 DIAGNOSIS — C569 Malignant neoplasm of unspecified ovary: Secondary | ICD-10-CM

## 2014-06-27 MED ORDER — HEPARIN SOD (PORK) LOCK FLUSH 100 UNIT/ML IV SOLN
500.0000 [IU] | Freq: Once | INTRAVENOUS | Status: AC
  Administered 2014-06-27: 500 [IU] via INTRAVENOUS
  Filled 2014-06-27: qty 5

## 2014-06-27 MED ORDER — SODIUM CHLORIDE 0.9 % IJ SOLN
10.0000 mL | INTRAMUSCULAR | Status: DC | PRN
  Administered 2014-06-27: 10 mL via INTRAVENOUS
  Filled 2014-06-27: qty 10

## 2014-06-27 MED ORDER — SODIUM CHLORIDE 0.9 % IV SOLN
INTRAVENOUS | Status: DC
  Administered 2014-06-27: 15:00:00 via INTRAVENOUS

## 2014-06-27 NOTE — Patient Instructions (Signed)
Dehydration, Adult Dehydration is when you lose more fluids from the body than you take in. Vital organs like the kidneys, brain, and heart cannot function without a proper amount of fluids and salt. Any loss of fluids from the body can cause dehydration.  CAUSES   Vomiting.  Diarrhea.  Excessive sweating.  Excessive urine output.  Fever. SYMPTOMS  Mild dehydration  Thirst.  Dry lips.  Slightly dry mouth. Moderate dehydration  Very dry mouth.  Sunken eyes.  Skin does not bounce back quickly when lightly pinched and released.  Dark urine and decreased urine production.  Decreased tear production.  Headache. Severe dehydration  Very dry mouth.  Extreme thirst.  Rapid, weak pulse (more than 100 beats per minute at rest).  Cold hands and feet.  Not able to sweat in spite of heat and temperature.  Rapid breathing.  Blue lips.  Confusion and lethargy.  Difficulty being awakened.  Minimal urine production.  No tears. DIAGNOSIS  Your caregiver will diagnose dehydration based on your symptoms and your exam. Blood and urine tests will help confirm the diagnosis. The diagnostic evaluation should also identify the cause of dehydration. TREATMENT  Treatment of mild or moderate dehydration can often be done at home by increasing the amount of fluids that you drink. It is best to drink small amounts of fluid more often. Drinking too much at one time can make vomiting worse. Refer to the home care instructions below. Severe dehydration needs to be treated at the hospital where you will probably be given intravenous (IV) fluids that contain water and electrolytes. HOME CARE INSTRUCTIONS   Ask your caregiver about specific rehydration instructions.  Drink enough fluids to keep your urine clear or pale yellow.  Drink small amounts frequently if you have nausea and vomiting.  Eat as you normally do.  Avoid:  Foods or drinks high in sugar.  Carbonated  drinks.  Juice.  Extremely hot or cold fluids.  Drinks with caffeine.  Fatty, greasy foods.  Alcohol.  Tobacco.  Overeating.  Gelatin desserts.  Wash your hands well to avoid spreading bacteria and viruses.  Only take over-the-counter or prescription medicines for pain, discomfort, or fever as directed by your caregiver.  Ask your caregiver if you should continue all prescribed and over-the-counter medicines.  Keep all follow-up appointments with your caregiver. SEEK MEDICAL CARE IF:  You have abdominal pain and it increases or stays in one area (localizes).  You have a rash, stiff neck, or severe headache.  You are irritable, sleepy, or difficult to awaken.  You are weak, dizzy, or extremely thirsty. SEEK IMMEDIATE MEDICAL CARE IF:   You are unable to keep fluids down or you get worse despite treatment.  You have frequent episodes of vomiting or diarrhea.  You have blood or green matter (bile) in your vomit.  You have blood in your stool or your stool looks black and tarry.  You have not urinated in 6 to 8 hours, or you have only urinated a small amount of very dark urine.  You have a fever.  You faint. MAKE SURE YOU:   Understand these instructions.  Will watch your condition.  Will get help right away if you are not doing well or get worse. Document Released: 10/14/2005 Document Revised: 01/06/2012 Document Reviewed: 06/03/2011 ExitCare Patient Information 2015 [employer]. This information is not intended to replace advice given to you by your health care provider. Make sure you discuss any questions you have with your health care   provider.  

## 2014-06-29 ENCOUNTER — Ambulatory Visit (HOSPITAL_BASED_OUTPATIENT_CLINIC_OR_DEPARTMENT_OTHER): Payer: Medicare Other

## 2014-06-29 VITALS — BP 135/72 | HR 73 | Temp 98.0°F

## 2014-06-29 DIAGNOSIS — C569 Malignant neoplasm of unspecified ovary: Secondary | ICD-10-CM

## 2014-06-29 DIAGNOSIS — Z5189 Encounter for other specified aftercare: Secondary | ICD-10-CM

## 2014-06-29 MED ORDER — TBO-FILGRASTIM 300 MCG/0.5ML ~~LOC~~ SOSY
300.0000 ug | PREFILLED_SYRINGE | Freq: Once | SUBCUTANEOUS | Status: AC
  Administered 2014-06-29: 300 ug via SUBCUTANEOUS
  Filled 2014-06-29: qty 0.5

## 2014-06-30 ENCOUNTER — Ambulatory Visit (HOSPITAL_BASED_OUTPATIENT_CLINIC_OR_DEPARTMENT_OTHER): Payer: Medicare Other

## 2014-06-30 ENCOUNTER — Telehealth: Payer: Self-pay | Admitting: *Deleted

## 2014-06-30 VITALS — BP 124/66 | HR 72 | Temp 98.2°F

## 2014-06-30 DIAGNOSIS — C561 Malignant neoplasm of right ovary: Secondary | ICD-10-CM

## 2014-06-30 DIAGNOSIS — C569 Malignant neoplasm of unspecified ovary: Secondary | ICD-10-CM

## 2014-06-30 DIAGNOSIS — R300 Dysuria: Secondary | ICD-10-CM

## 2014-06-30 MED ORDER — HYDROMORPHONE HCL 2 MG PO TABS: 2.0000 mg | ORAL_TABLET | ORAL | Status: DC | PRN

## 2014-06-30 MED ORDER — TBO-FILGRASTIM 300 MCG/0.5ML ~~LOC~~ SOSY
300.0000 ug | PREFILLED_SYRINGE | Freq: Once | SUBCUTANEOUS | Status: AC
  Administered 2014-06-30: 300 ug via SUBCUTANEOUS
  Filled 2014-06-30: qty 0.5

## 2014-06-30 NOTE — Telephone Encounter (Signed)
Per Thayer Headings, LPN patient needs refill on her dilaudid. Script done and placed in binder in injection room.

## 2014-07-01 ENCOUNTER — Ambulatory Visit (HOSPITAL_BASED_OUTPATIENT_CLINIC_OR_DEPARTMENT_OTHER): Payer: Medicare Other

## 2014-07-01 VITALS — BP 125/67 | HR 70 | Temp 98.3°F

## 2014-07-01 DIAGNOSIS — Z5189 Encounter for other specified aftercare: Secondary | ICD-10-CM

## 2014-07-01 DIAGNOSIS — C569 Malignant neoplasm of unspecified ovary: Secondary | ICD-10-CM

## 2014-07-01 MED ORDER — TBO-FILGRASTIM 300 MCG/0.5ML ~~LOC~~ SOSY
300.0000 ug | PREFILLED_SYRINGE | Freq: Once | SUBCUTANEOUS | Status: AC
  Administered 2014-07-01: 300 ug via SUBCUTANEOUS
  Filled 2014-07-01: qty 0.5

## 2014-07-08 ENCOUNTER — Ambulatory Visit (HOSPITAL_BASED_OUTPATIENT_CLINIC_OR_DEPARTMENT_OTHER): Payer: Medicare Other | Admitting: Oncology

## 2014-07-08 ENCOUNTER — Ambulatory Visit (HOSPITAL_BASED_OUTPATIENT_CLINIC_OR_DEPARTMENT_OTHER): Payer: Medicare Other

## 2014-07-08 ENCOUNTER — Encounter: Payer: Self-pay | Admitting: Oncology

## 2014-07-08 VITALS — BP 115/54 | HR 88 | Temp 99.3°F | Resp 18 | Ht 67.0 in | Wt 178.7 lb

## 2014-07-08 DIAGNOSIS — C569 Malignant neoplasm of unspecified ovary: Secondary | ICD-10-CM

## 2014-07-08 DIAGNOSIS — E039 Hypothyroidism, unspecified: Secondary | ICD-10-CM

## 2014-07-08 DIAGNOSIS — G609 Hereditary and idiopathic neuropathy, unspecified: Secondary | ICD-10-CM

## 2014-07-08 LAB — CBC WITH DIFFERENTIAL/PLATELET
BASO%: 0.5 % (ref 0.0–2.0)
Basophils Absolute: 0 10*3/uL (ref 0.0–0.1)
EOS%: 1.8 % (ref 0.0–7.0)
Eosinophils Absolute: 0.1 10*3/uL (ref 0.0–0.5)
HCT: 27.9 % — ABNORMAL LOW (ref 34.8–46.6)
HGB: 9.4 g/dL — ABNORMAL LOW (ref 11.6–15.9)
LYMPH#: 0.5 10*3/uL — AB (ref 0.9–3.3)
LYMPH%: 6.1 % — ABNORMAL LOW (ref 14.0–49.7)
MCH: 30.2 pg (ref 25.1–34.0)
MCHC: 33.6 g/dL (ref 31.5–36.0)
MCV: 89.9 fL (ref 79.5–101.0)
MONO#: 0.5 10*3/uL (ref 0.1–0.9)
MONO%: 6.2 % (ref 0.0–14.0)
NEUT#: 6.8 10*3/uL — ABNORMAL HIGH (ref 1.5–6.5)
NEUT%: 85.4 % — AB (ref 38.4–76.8)
Platelets: 271 10*3/uL (ref 145–400)
RBC: 3.1 10*6/uL — ABNORMAL LOW (ref 3.70–5.45)
RDW: 14.9 % — ABNORMAL HIGH (ref 11.2–14.5)
WBC: 7.9 10*3/uL (ref 3.9–10.3)

## 2014-07-08 LAB — COMPREHENSIVE METABOLIC PANEL (CC13)
ALT: 9 U/L (ref 0–55)
ANION GAP: 11 meq/L (ref 3–11)
AST: 17 U/L (ref 5–34)
Albumin: 3.7 g/dL (ref 3.5–5.0)
Alkaline Phosphatase: 70 U/L (ref 40–150)
BUN: 20.8 mg/dL (ref 7.0–26.0)
CALCIUM: 9.2 mg/dL (ref 8.4–10.4)
CO2: 26 meq/L (ref 22–29)
CREATININE: 1.1 mg/dL (ref 0.6–1.1)
Chloride: 102 mEq/L (ref 98–109)
GLUCOSE: 161 mg/dL — AB (ref 70–140)
Potassium: 3.7 mEq/L (ref 3.5–5.1)
Sodium: 139 mEq/L (ref 136–145)
Total Bilirubin: 0.5 mg/dL (ref 0.20–1.20)
Total Protein: 7.3 g/dL (ref 6.4–8.3)

## 2014-07-08 NOTE — Progress Notes (Signed)
OFFICE PROGRESS NOTE   07/08/2014   Physicians:Paola Alycia Rossetti, Gareth Eagle (PCP), Laurence Spates, Judeth Horn, Lawerance Bach   INTERVAL HISTORY:  Patient is seen, alone for visit, in continuing attention to chemotherapy + avastin in process for gyn carcinosarcoma. She had cycle 5 carboplatin taxol + cycle 2 avastin on 06-24-24, with IVF on day 4 and granix x 3 days beginning day 7 (due to severe taxol aches). She had usual chemo related fatigue and some nausea, which were gradually improving when she developed NP cough, mostly at night, and clear sinus drainage ~ 3 days ago. Tmax was 100.5, without chills. She has had no significant epistaxis even with the respiratory infection. She is drinking fluids adequately. She denies increased SOB, chest pain. 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 She saw Dr Wilson Singer recently, I believe no changes by him.  She has PAC. She will not have flu vaccine today due to respiratory infection. She has not had genetics testing.  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 She was admitted thru ED 11-21-13 with [place] evidence of cholelithiasis, tho no signs of cholecystitis then. (Note patient tells me that she had CT in Jan, however this EMR has only abdominal US then). She was taken to laparoscopic cholecystectomy by Dr Hulen Skains on 11-22-13, with multiple large stones in specimen and benign pathology. Her post operative course was not remarkable and the pain initially resolved entirely, with patient feeling well at post op visit in mid Feb. By ~March she had more abdominal cramping and dull pain, with abdominal distension and was treated with cipro for apparent UTI. She was seen in ED on  02-03-14 with concern for recurrent UTI, with abdominal symptoms as above + intermittent nausea and vomiting, with abdomen tender on exam then. CT AP found large heterogeneous mass lower mid abdomen/ pelvis 19.4 x 13.6 x 14.3 cm with soft tissue along pericolic gutters and left anterior abdomen, with mild bilateral hydronephrosis and small right pleural effusion. CA 125 resulted at 67.9 and CEA was 1.0. She saw Dr Alycia Rossetti on 02-09-14 and was hospitalized at [facility] from 02-11-14 thru 02-25-14. She went to exploratory laparotomy on 02-15-14, with findings of 20 cm mass filling pelvis and involving rectosigmoid and bilateral ovaries and tubes, with extension of tumor deep in pelvis and along rectum. Surgery was BSO/omentectomy and rectosigmoid resection with reanastomosis was performed, then diverting loop colostomy due to intraoperative concerns about the proximal rectal stump. She had bilateral ureterolysis for retroperitoneal fibrosis. At completion of procedure, which was optimal debulking, there were scattered <1 cm nodules on peritoneum, small bowel and small bowel mesentery, with no appreciable upper abdominal disease. She was transfused 2 units PRBCs intraoperatively. [facility] [phone] from 02-15-2014) found high grade carcinosarcoma 17 cm originating in one ovary which was adherent to multiple structures, metastatic disease in 1.8 cm peritoneal nodule as well as sigmoid colon nodule 1.5 cm and in omentum, no lymph nodes submitted, no identified LVSI, margin of one end of colon positive, The colon specimen also had adenomatous polyp without high grade dysplasia.  She was hospitalized at [facility] from 02-11-14 thru 02-25-14. Preoperatively she had acute kidney injury, with increase in creatinine from baseline 0.7 to high of 2.17; creatinine postoperatively was 1.52.  She needed NG following surgery.  PAC was placed at [facility] on 02-21-14.  Recommendations from [facility] multidisciplinary conference 02-23-2014 was for chemotherapy with  platinum and taxane. She had cycle 1 taxol carboplatin on 04-24-30, complicated by severe taxol aches and by neutropenia day 11, gCSF support added. She has needed IVF/ IV antiemetics on ~ day 4 additionally.  CT CAP 05-23-14 showed peritoneal involvement such that avastin is to be added to chemotherapy beginning cycle 4.   Review of systems as above, also: Bowels/ colostomy ok. PAC ok.  Remainder of 10 point Review of Systems negative.  Objective:  Vital signs in last 24 hours:  BP 115/54  Pulse 88  Temp(Src) 99.3 F (37.4 C) (Oral)  Resp 18  Ht 5\' 7"  (1.702 m)  Wt 178 lb 11.2 oz (81.058 kg)  BMI 27.98 kg/m2 Weight is down ~ 2 lbs. Alert, oriented and appropriate. Ambulatory without difficulty. Somewhat nasally congested, occasional minimal NP cough, respirations not labored, looks mildly ill Alopecia  HEENT:PERRL, sclerae not icteric. Oral mucosa moist without lesions, posterior pharynx minimal dull erythema without exudate. Nasal turbinates boggy without purulent exudate Neck supple. No JVD.  Lymphatics:no cervical,supraclavicular adenopathy Resp: clear to auscultation bilaterally and normal percussion bilaterally Cardio: regular rate and rhythm. No gallop. GI: soft, nontender, not distended, no mass or organomegaly. Normally active bowel sounds. Surgical incision not remarkable. Colostomy functioning, stoma pink. Musculoskeletal/ Extremities: without pitting edema, cords, tenderness Neuro: no peripheral neuropathy. Otherwise nonfocal Skin without rash, ecchymosis, petechiae Portacath-without erythema or tenderness  Lab Results:  Results for orders placed in visit on 07/08/14  CBC WITH DIFFERENTIAL      Result Value Ref Range   WBC 7.9  3.9 - 10.3 10e3/uL   NEUT# 6.8 (*) 1.5 - 6.5 10e3/uL   HGB 9.4 (*) 11.6 - 15.9 g/dL   HCT 27.9 (*) 34.8 - 46.6 %   Platelets 271  145 - 400 10e3/uL   MCV 89.9  79.5 - 101.0 fL   MCH 30.2  25.1 - 34.0 pg   MCHC 33.6  31.5 - 36.0 g/dL    RBC 3.10 (*) 3.70 - 5.45 10e6/uL   RDW 14.9 (*) 11.2 - 14.5 %   lymph# 0.5 (*) 0.9 - 3.3 10e3/uL   MONO# 0.5  0.1 - 0.9 10e3/uL   Eosinophils Absolute 0.1  0.0 - 0.5 10e3/uL   Basophils Absolute 0.0  0.0 - 0.1 10e3/uL   NEUT% 85.4 (*) 38.4 - 76.8 %   LYMPH% 6.1 (*) 14.0 - 49.7 %   MONO% 6.2  0.0 - 14.0 %   EOS% 1.8  0.0 - 7.0 %   BASO% 0.5  0.0 - 2.0 %  COMPREHENSIVE METABOLIC PANEL (DV76)      Result Value Ref Range   Sodium 139  136 - 145 mEq/L   Potassium 3.7  3.5 - 5.1 mEq/L   Chloride 102  98 - 109 mEq/L   CO2 26  22 - 29 mEq/L   Glucose 161 (*) 70 - 140 mg/dl   BUN 20.8  7.0 - 26.0 mg/dL   Creatinine 1.1  0.6 - 1.1 mg/dL   Total Bilirubin 0.50  0.20 - 1.20 mg/dL   Alkaline Phosphatase 70  40 - 150 U/L   AST 17  5 - 34 U/L   ALT 9  0 - 55 U/L   Total Protein 7.3  6.4 - 8.3 g/dL   Albumin 3.7  3.5 - 5.0 g/dL   Calcium 9.2  8.4 -  10.4 mg/dL   Anion Gap 11  3 - 11 mEq/L     Studies/Results:  No results found.  Medications: I have reviewed the patient's current medications. No indication for antibiotic now for what appears to be viral upper respiratory infection.  DISCUSSION: she will let us know next week if respiratory symptoms have not essentially resolved, in which case may need to delay cycle 6 chemo for a week. She will also let us know if higher fever, productive cough, SOB etc.  Assessment/Plan:  1.IIIC carcinosarcoma of ovary: post optimal debulking including rectosigmoid resection and diverting loop colostomy at [facility] 02-15-14. Evidence of peritoneal disease post 3 cycles of taxol carboplatin, with plan to add avastin beginning cycle 4 on 06-03-14. She will have cycle 6 on 9-18 if the viral URI has resolved, with IVF on day 4 and neupogen x3 beginning day 7. I will see her back ~ 3 weeks after cycle 6, and will repeat CT prior to gyn oncology reevaluation also in Oct. 2. Viral respiratory infection vs environmental allergies: no indication for antibiotics now. Continue  claritin, push fluids, call if does not progessively improve. 3.PAC placed at [facility].  4.hypothyroid on replacement, post partial thyroidectomy  5.HTN  6.acute renal insufficiency around recent surgery, resolved.  7.no intolerance to zofran  8. Anemia: needed PRBCs around surgery, not obviously symptomatic now. Iron studies not low on 04-15-14. Follow.  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  All questions answered. Chemo and supportive care orders completed.   Dylyn Mclaren P, MD   07/08/2014, 2:07 PM

## 2014-07-09 LAB — CA 125: CA 125: 6 U/mL (ref <35)

## 2014-07-09 LAB — CA 125(PREVIOUS METHOD): CA 125: 5.3 U/mL (ref 0.0–30.2)

## 2014-07-11 ENCOUNTER — Telehealth: Payer: Self-pay | Admitting: *Deleted

## 2014-07-11 ENCOUNTER — Telehealth: Payer: Self-pay | Admitting: Oncology

## 2014-07-11 ENCOUNTER — Telehealth: Payer: Self-pay

## 2014-07-11 NOTE — Telephone Encounter (Signed)
Message copied by Baruch Merl on Mon Jul 11, 2014  4:51 PM ------      Message from: Gordy Levan      Created: Sun Jul 10, 2014  6:46 PM       Labs seen and need follow up please let her know ca125 in good low range still ------

## 2014-07-11 NOTE — Telephone Encounter (Signed)
Per staff message and POF I have scheduled appts. Advised scheduler of appts. JMW  

## 2014-07-11 NOTE — Telephone Encounter (Signed)
lvm for pt daughter about Sept appts.Marland KitchenMarland KitchenMarland KitchenMarland Kitchenpt # did not accept vm to be left.Marland KitchenMarland Kitchen

## 2014-07-11 NOTE — Telephone Encounter (Signed)
Told [name] the results of the Ca-125 from 07-08-14.  [name] verbalized understanding.

## 2014-07-15 ENCOUNTER — Ambulatory Visit (HOSPITAL_BASED_OUTPATIENT_CLINIC_OR_DEPARTMENT_OTHER): Payer: Medicare Other

## 2014-07-15 ENCOUNTER — Other Ambulatory Visit: Payer: Medicare Other

## 2014-07-15 ENCOUNTER — Other Ambulatory Visit (HOSPITAL_BASED_OUTPATIENT_CLINIC_OR_DEPARTMENT_OTHER): Payer: Medicare Other

## 2014-07-15 VITALS — BP 145/73 | HR 90 | Temp 98.0°F | Resp 18

## 2014-07-15 DIAGNOSIS — Z5111 Encounter for antineoplastic chemotherapy: Secondary | ICD-10-CM

## 2014-07-15 DIAGNOSIS — Z5112 Encounter for antineoplastic immunotherapy: Secondary | ICD-10-CM

## 2014-07-15 DIAGNOSIS — C569 Malignant neoplasm of unspecified ovary: Secondary | ICD-10-CM

## 2014-07-15 DIAGNOSIS — C561 Malignant neoplasm of right ovary: Secondary | ICD-10-CM

## 2014-07-15 LAB — COMPREHENSIVE METABOLIC PANEL (CC13)
ALT: 9 U/L (ref 0–55)
ANION GAP: 10 meq/L (ref 3–11)
AST: 16 U/L (ref 5–34)
Albumin: 3.9 g/dL (ref 3.5–5.0)
Alkaline Phosphatase: 61 U/L (ref 40–150)
BILIRUBIN TOTAL: 0.27 mg/dL (ref 0.20–1.20)
BUN: 28.8 mg/dL — ABNORMAL HIGH (ref 7.0–26.0)
CALCIUM: 9.9 mg/dL (ref 8.4–10.4)
CHLORIDE: 108 meq/L (ref 98–109)
CO2: 23 meq/L (ref 22–29)
Creatinine: 1.2 mg/dL — ABNORMAL HIGH (ref 0.6–1.1)
Glucose: 172 mg/dl — ABNORMAL HIGH (ref 70–140)
Potassium: 4.4 mEq/L (ref 3.5–5.1)
SODIUM: 141 meq/L (ref 136–145)
TOTAL PROTEIN: 8 g/dL (ref 6.4–8.3)

## 2014-07-15 LAB — CBC WITH DIFFERENTIAL/PLATELET
BASO%: 0.2 % (ref 0.0–2.0)
Basophils Absolute: 0 10*3/uL (ref 0.0–0.1)
EOS%: 0 % (ref 0.0–7.0)
Eosinophils Absolute: 0 10*3/uL (ref 0.0–0.5)
HCT: 30.1 % — ABNORMAL LOW (ref 34.8–46.6)
HGB: 10.1 g/dL — ABNORMAL LOW (ref 11.6–15.9)
LYMPH#: 0.3 10*3/uL — AB (ref 0.9–3.3)
LYMPH%: 7.3 % — ABNORMAL LOW (ref 14.0–49.7)
MCH: 30.3 pg (ref 25.1–34.0)
MCHC: 33.6 g/dL (ref 31.5–36.0)
MCV: 90.4 fL (ref 79.5–101.0)
MONO#: 0 10*3/uL — ABNORMAL LOW (ref 0.1–0.9)
MONO%: 0.4 % (ref 0.0–14.0)
NEUT#: 4.3 10*3/uL (ref 1.5–6.5)
NEUT%: 92.1 % — ABNORMAL HIGH (ref 38.4–76.8)
Platelets: 358 10*3/uL (ref 145–400)
RBC: 3.33 10*6/uL — AB (ref 3.70–5.45)
RDW: 14.1 % (ref 11.2–14.5)
WBC: 4.7 10*3/uL (ref 3.9–10.3)

## 2014-07-15 LAB — UA PROTEIN, DIPSTICK - CHCC: Protein, ur: <30 mg/dL

## 2014-07-15 MED ORDER — DEXAMETHASONE SODIUM PHOSPHATE 20 MG/5ML IJ SOLN
INTRAMUSCULAR | Status: AC
  Filled 2014-07-15: qty 5

## 2014-07-15 MED ORDER — FAMOTIDINE IN NACL 20-0.9 MG/50ML-% IV SOLN
20.0000 mg | Freq: Once | INTRAVENOUS | Status: AC
  Administered 2014-07-15: 20 mg via INTRAVENOUS

## 2014-07-15 MED ORDER — DIPHENHYDRAMINE HCL 50 MG/ML IJ SOLN
25.0000 mg | Freq: Once | INTRAMUSCULAR | Status: AC
  Administered 2014-07-15: 25 mg via INTRAVENOUS

## 2014-07-15 MED ORDER — SODIUM CHLORIDE 0.9 % IV SOLN
150.0000 mg | Freq: Once | INTRAVENOUS | Status: AC
  Administered 2014-07-15: 150 mg via INTRAVENOUS
  Filled 2014-07-15: qty 5

## 2014-07-15 MED ORDER — SODIUM CHLORIDE 0.9 % IV SOLN
15.0000 mg/kg | Freq: Once | INTRAVENOUS | Status: AC
  Administered 2014-07-15: 1250 mg via INTRAVENOUS
  Filled 2014-07-15: qty 50

## 2014-07-15 MED ORDER — HEPARIN SOD (PORK) LOCK FLUSH 100 UNIT/ML IV SOLN
500.0000 [IU] | Freq: Once | INTRAVENOUS | Status: AC | PRN
  Administered 2014-07-15: 500 [IU]
  Filled 2014-07-15: qty 5

## 2014-07-15 MED ORDER — ONDANSETRON 16 MG/50ML IVPB (CHCC)
INTRAVENOUS | Status: AC
  Filled 2014-07-15: qty 16

## 2014-07-15 MED ORDER — LORAZEPAM 1 MG PO TABS
0.5000 mg | ORAL_TABLET | ORAL | Status: DC
  Administered 2014-07-15: 0.5 mg via ORAL

## 2014-07-15 MED ORDER — DEXAMETHASONE SODIUM PHOSPHATE 20 MG/5ML IJ SOLN
12.0000 mg | Freq: Once | INTRAMUSCULAR | Status: AC
  Administered 2014-07-15: 12 mg via INTRAVENOUS

## 2014-07-15 MED ORDER — PACLITAXEL CHEMO INJECTION 300 MG/50ML
175.0000 mg/m2 | Freq: Once | INTRAVENOUS | Status: AC
  Administered 2014-07-15: 348 mg via INTRAVENOUS
  Filled 2014-07-15: qty 58

## 2014-07-15 MED ORDER — DIPHENHYDRAMINE HCL 50 MG/ML IJ SOLN
INTRAMUSCULAR | Status: AC
  Filled 2014-07-15: qty 1

## 2014-07-15 MED ORDER — LORAZEPAM 1 MG PO TABS
ORAL_TABLET | ORAL | Status: AC
  Filled 2014-07-15: qty 1

## 2014-07-15 MED ORDER — ONDANSETRON 16 MG/50ML IVPB (CHCC)
16.0000 mg | Freq: Once | INTRAVENOUS | Status: AC
  Administered 2014-07-15: 16 mg via INTRAVENOUS

## 2014-07-15 MED ORDER — DIPHENHYDRAMINE HCL 25 MG PO CAPS
ORAL_CAPSULE | ORAL | Status: AC
  Filled 2014-07-15: qty 1

## 2014-07-15 MED ORDER — FAMOTIDINE IN NACL 20-0.9 MG/50ML-% IV SOLN
INTRAVENOUS | Status: AC
  Filled 2014-07-15: qty 50

## 2014-07-15 MED ORDER — SODIUM CHLORIDE 0.9 % IJ SOLN
10.0000 mL | INTRAMUSCULAR | Status: DC | PRN
  Administered 2014-07-15: 10 mL
  Filled 2014-07-15: qty 10

## 2014-07-15 MED ORDER — SODIUM CHLORIDE 0.9 % IV SOLN
Freq: Once | INTRAVENOUS | Status: AC
  Administered 2014-07-15: 12:00:00 via INTRAVENOUS

## 2014-07-15 MED ORDER — SODIUM CHLORIDE 0.9 % IV SOLN
441.5000 mg | Freq: Once | INTRAVENOUS | Status: AC
  Administered 2014-07-15: 440 mg via INTRAVENOUS
  Filled 2014-07-15: qty 44

## 2014-07-15 NOTE — Patient Instructions (Signed)
[facility] Discharge Instructions for Patients Receiving Chemotherapy  Today you received the following chemotherapy agents:  Avastin, Taxol and Carboplatin  To help prevent nausea and vomiting after your treatment, we encourage you to take your nausea medication as ordered per MD.  If you develop nausea and vomiting that is not controlled by your nausea medication, call the clinic.   BELOW ARE SYMPTOMS THAT SHOULD BE REPORTED IMMEDIATELY:  *FEVER GREATER THAN 100.5 F  *CHILLS WITH OR WITHOUT FEVER  NAUSEA AND VOMITING THAT IS NOT CONTROLLED WITH YOUR NAUSEA MEDICATION  *UNUSUAL SHORTNESS OF BREATH  *UNUSUAL BRUISING OR BLEEDING  TENDERNESS IN MOUTH AND THROAT WITH OR WITHOUT PRESENCE OF ULCERS  *URINARY PROBLEMS  *BOWEL PROBLEMS  UNUSUAL RASH Items with * indicate a potential emergency and should be followed up as soon as possible.  Feel free to call the clinic you have any questions or concerns. The clinic phone number is [phone].

## 2014-07-16 ENCOUNTER — Other Ambulatory Visit: Payer: Self-pay | Admitting: Oncology

## 2014-07-16 DIAGNOSIS — C569 Malignant neoplasm of unspecified ovary: Secondary | ICD-10-CM

## 2014-07-18 ENCOUNTER — Other Ambulatory Visit: Payer: Self-pay | Admitting: *Deleted

## 2014-07-18 ENCOUNTER — Other Ambulatory Visit: Payer: Self-pay

## 2014-07-18 ENCOUNTER — Ambulatory Visit (HOSPITAL_BASED_OUTPATIENT_CLINIC_OR_DEPARTMENT_OTHER): Payer: Medicare Other

## 2014-07-18 VITALS — BP 151/62 | HR 65 | Temp 97.9°F

## 2014-07-18 DIAGNOSIS — Z5189 Encounter for other specified aftercare: Secondary | ICD-10-CM

## 2014-07-18 DIAGNOSIS — C569 Malignant neoplasm of unspecified ovary: Secondary | ICD-10-CM

## 2014-07-18 DIAGNOSIS — C561 Malignant neoplasm of right ovary: Secondary | ICD-10-CM

## 2014-07-18 DIAGNOSIS — R300 Dysuria: Secondary | ICD-10-CM

## 2014-07-18 MED ORDER — HYDROMORPHONE HCL 2 MG PO TABS: 2.0000 mg | ORAL_TABLET | Freq: Four times a day (QID) | ORAL | Status: DC | PRN

## 2014-07-18 MED ORDER — SODIUM CHLORIDE 0.9 % IJ SOLN
10.0000 mL | INTRAMUSCULAR | Status: DC | PRN
  Administered 2014-07-18: 10 mL via INTRAVENOUS
  Filled 2014-07-18: qty 10

## 2014-07-18 MED ORDER — SODIUM CHLORIDE 0.9 % IV SOLN
INTRAVENOUS | Status: DC
  Administered 2014-07-18: 13:00:00 via INTRAVENOUS

## 2014-07-18 MED ORDER — HEPARIN SOD (PORK) LOCK FLUSH 100 UNIT/ML IV SOLN
500.0000 [IU] | Freq: Once | INTRAVENOUS | Status: AC
  Administered 2014-07-18: 500 [IU] via INTRAVENOUS
  Filled 2014-07-18: qty 5

## 2014-07-18 NOTE — Patient Instructions (Signed)

## 2014-07-21 ENCOUNTER — Ambulatory Visit (HOSPITAL_BASED_OUTPATIENT_CLINIC_OR_DEPARTMENT_OTHER): Payer: Medicare Other

## 2014-07-21 VITALS — BP 124/65 | HR 80 | Temp 98.1°F

## 2014-07-21 DIAGNOSIS — Z5189 Encounter for other specified aftercare: Secondary | ICD-10-CM

## 2014-07-21 DIAGNOSIS — C569 Malignant neoplasm of unspecified ovary: Secondary | ICD-10-CM

## 2014-07-21 MED ORDER — TBO-FILGRASTIM 300 MCG/0.5ML ~~LOC~~ SOSY
300.0000 ug | PREFILLED_SYRINGE | Freq: Once | SUBCUTANEOUS | Status: AC
  Administered 2014-07-21: 300 ug via SUBCUTANEOUS
  Filled 2014-07-21: qty 0.5

## 2014-07-22 ENCOUNTER — Ambulatory Visit (HOSPITAL_BASED_OUTPATIENT_CLINIC_OR_DEPARTMENT_OTHER): Payer: Medicare Other

## 2014-07-22 VITALS — BP 128/61 | HR 77 | Temp 98.2°F

## 2014-07-22 DIAGNOSIS — C569 Malignant neoplasm of unspecified ovary: Secondary | ICD-10-CM

## 2014-07-22 DIAGNOSIS — Z5189 Encounter for other specified aftercare: Secondary | ICD-10-CM

## 2014-07-22 MED ORDER — TBO-FILGRASTIM 300 MCG/0.5ML ~~LOC~~ SOSY
300.0000 ug | PREFILLED_SYRINGE | Freq: Once | SUBCUTANEOUS | Status: AC
  Administered 2014-07-22: 300 ug via SUBCUTANEOUS
  Filled 2014-07-22: qty 0.5

## 2014-07-23 ENCOUNTER — Ambulatory Visit (HOSPITAL_BASED_OUTPATIENT_CLINIC_OR_DEPARTMENT_OTHER): Payer: Medicare Other

## 2014-07-23 VITALS — BP 115/67 | HR 85 | Temp 98.9°F | Resp 18

## 2014-07-23 DIAGNOSIS — C569 Malignant neoplasm of unspecified ovary: Secondary | ICD-10-CM

## 2014-07-23 DIAGNOSIS — Z5189 Encounter for other specified aftercare: Secondary | ICD-10-CM

## 2014-07-23 MED ORDER — TBO-FILGRASTIM 300 MCG/0.5ML ~~LOC~~ SOSY
300.0000 ug | PREFILLED_SYRINGE | Freq: Once | SUBCUTANEOUS | Status: AC
  Administered 2014-07-23: 300 ug via SUBCUTANEOUS

## 2014-07-24 ENCOUNTER — Other Ambulatory Visit: Payer: Self-pay | Admitting: Oncology

## 2014-07-24 DIAGNOSIS — C569 Malignant neoplasm of unspecified ovary: Secondary | ICD-10-CM

## 2014-07-26 ENCOUNTER — Telehealth: Payer: Self-pay | Admitting: Oncology

## 2014-07-26 NOTE — Telephone Encounter (Signed)
s.w. pt and advised on OCT and NOV appt....pt ok and aware °

## 2014-07-27 ENCOUNTER — Encounter: Payer: Self-pay | Admitting: *Deleted

## 2014-08-03 ENCOUNTER — Other Ambulatory Visit (HOSPITAL_BASED_OUTPATIENT_CLINIC_OR_DEPARTMENT_OTHER): Payer: Medicare Other

## 2014-08-03 ENCOUNTER — Encounter: Payer: Self-pay | Admitting: Oncology

## 2014-08-03 ENCOUNTER — Ambulatory Visit (HOSPITAL_BASED_OUTPATIENT_CLINIC_OR_DEPARTMENT_OTHER): Payer: Medicare Other | Admitting: Oncology

## 2014-08-03 ENCOUNTER — Telehealth: Payer: Self-pay | Admitting: Oncology

## 2014-08-03 VITALS — BP 121/69 | HR 72 | Temp 98.1°F | Resp 20 | Ht 67.0 in | Wt 181.1 lb

## 2014-08-03 DIAGNOSIS — G622 Polyneuropathy due to other toxic agents: Secondary | ICD-10-CM

## 2014-08-03 DIAGNOSIS — C569 Malignant neoplasm of unspecified ovary: Secondary | ICD-10-CM

## 2014-08-03 DIAGNOSIS — Z23 Encounter for immunization: Secondary | ICD-10-CM

## 2014-08-03 LAB — CBC WITH DIFFERENTIAL/PLATELET
BASO%: 0.8 % (ref 0.0–2.0)
Basophils Absolute: 0 10*3/uL (ref 0.0–0.1)
EOS%: 0.5 % (ref 0.0–7.0)
Eosinophils Absolute: 0 10*3/uL (ref 0.0–0.5)
HEMATOCRIT: 30.7 % — AB (ref 34.8–46.6)
HGB: 10.5 g/dL — ABNORMAL LOW (ref 11.6–15.9)
LYMPH#: 0.7 10*3/uL — AB (ref 0.9–3.3)
LYMPH%: 18.3 % (ref 14.0–49.7)
MCH: 31.3 pg (ref 25.1–34.0)
MCHC: 34.2 g/dL (ref 31.5–36.0)
MCV: 91.4 fL (ref 79.5–101.0)
MONO#: 0.3 10*3/uL (ref 0.1–0.9)
MONO%: 8.5 % (ref 0.0–14.0)
NEUT#: 2.8 10*3/uL (ref 1.5–6.5)
NEUT%: 71.9 % (ref 38.4–76.8)
Platelets: 184 10*3/uL (ref 145–400)
RBC: 3.36 10*6/uL — AB (ref 3.70–5.45)
RDW: 15.2 % — AB (ref 11.2–14.5)
WBC: 3.9 10*3/uL (ref 3.9–10.3)

## 2014-08-03 LAB — BASIC METABOLIC PANEL (CC13)
Anion Gap: 7 mEq/L (ref 3–11)
BUN: 21.2 mg/dL (ref 7.0–26.0)
CHLORIDE: 108 meq/L (ref 98–109)
CO2: 26 meq/L (ref 22–29)
Calcium: 9.6 mg/dL (ref 8.4–10.4)
Creatinine: 0.9 mg/dL (ref 0.6–1.1)
Glucose: 123 mg/dl (ref 70–140)
POTASSIUM: 3.9 meq/L (ref 3.5–5.1)
SODIUM: 141 meq/L (ref 136–145)

## 2014-08-03 MED ORDER — GABAPENTIN 100 MG PO CAPS: 100.0000 mg | ORAL_CAPSULE | Freq: Every day | ORAL | Status: DC

## 2014-08-03 MED ORDER — INFLUENZA VAC SPLIT QUAD 0.5 ML IM SUSY
0.5000 mL | PREFILLED_SYRINGE | Freq: Once | INTRAMUSCULAR | Status: AC
  Administered 2014-08-03: 0.5 mL via INTRAMUSCULAR
  Filled 2014-08-03: qty 0.5

## 2014-08-03 NOTE — Telephone Encounter (Signed)
per pof to sch pt appt-gave pt contrast for CT and copy of sch

## 2014-08-03 NOTE — Progress Notes (Signed)
OFFICE PROGRESS NOTE   08/03/2014   Physicians:Paola Alycia Rossetti, Gareth Eagle (PCP), Laurence Spates, Judeth Horn, Lawerance Bach   INTERVAL HISTORY:  Patient is seen, alone for visit, in follow up of recent chemotherapy + avastin for ovarian carcinosarcoma (laterality not clear from path report). She had 6 cycles of carboplatin and taxol from 03-25-14 thru 07-15-14, with avastin added with cycle 4 (06-03-14). She required gCSF support with chemo.  Patient still fatigues easily, but can tell she is improving. She has taxol neuropathy since last treatment, bothersome in feet especially at night, tho none significant in hands. She would like to try gabapentin at hs for this, as the neuropathy is making it difficult for her to sleep. Bowels are moving well via colostomy. She denies abdominal or pelvic discomfort. No bleeding.  She has PAC, which should be used for CT 08-15-14 She is given flu vaccine today  ONCOLOGIC HISTORY   Ovarian carcinosarcoma   02/09/2014 Initial Diagnosis Ovarian carcinosarcoma   02/15/2014 Surgery IIIC MMMT ovary   03/25/2014 -  Chemotherapy paclitaxel and carboplatin  Patient had been in usual generally good health, status post remote hysterectomy, until abdominal pain with radiation to right shoulder in Jan 2015. She was admitted thru ED 11-21-13 with [place] evidence of cholelithiasis, tho no signs of cholecystitis then. (Note patient tells me that she had CT in Jan, however this EMR has only abdominal US then). She was taken to laparoscopic cholecystectomy by Dr Hulen Skains on 11-22-13, with multiple large stones in specimen and benign pathology. Her post operative course was not remarkable and the pain initially resolved entirely, with patient feeling well at post op visit in mid Feb. By ~March she had more abdominal cramping and dull pain, with abdominal distension and was treated with cipro for apparent UTI. She was seen in ED on 02-03-14 with concern for recurrent UTI, with abdominal symptoms as  above + intermittent nausea and vomiting, with abdomen tender on exam then. CT AP found large heterogeneous mass lower mid abdomen/ pelvis 19.4 x 13.6 x 14.3 cm with soft tissue along pericolic gutters and left anterior abdomen, with mild bilateral hydronephrosis and small right pleural effusion. CA 125 resulted at 67.9 and CEA was 1.0. She saw Dr Alycia Rossetti on 02-09-14 and was hospitalized at [facility] from 02-11-14 thru 02-25-14. She went to exploratory laparotomy on 02-15-14, with findings of 20 cm mass filling pelvis and involving rectosigmoid and bilateral ovaries and tubes, with extension of tumor deep in pelvis and along rectum. Surgery was BSO/omentectomy and rectosigmoid resection with reanastomosis was performed, then diverting loop colostomy due to intraoperative concerns about the proximal rectal stump. She had bilateral ureterolysis for retroperitoneal fibrosis. At completion of procedure, which was optimal debulking, there were scattered <1 cm nodules on peritoneum, small bowel and small bowel mesentery, with no appreciable upper abdominal disease. She was transfused 2 units PRBCs intraoperatively. [facility] [phone] from 02-15-2014) found high grade carcinosarcoma 17 cm originating in one ovary which was adherent to multiple structures, metastatic disease in 1.8 cm peritoneal nodule as well as sigmoid colon nodule 1.5 cm and in omentum, no lymph nodes submitted, no identified LVSI, margin of one end of colon positive, The colon specimen also had adenomatous polyp without high grade dysplasia.  She was hospitalized at [facility] from 02-11-14 thru 02-25-14. Preoperatively she had acute kidney injury, with increase in creatinine from baseline 0.7 to high of 2.17; creatinine postoperatively was 1.52. She needed NG following surgery.  PAC was placed at [facility] on  02-21-14.  Recommendations from [facility] multidisciplinary conference 02-23-2014 was for chemotherapy with platinum and taxane. She had cycle 1 taxol carboplatin on  7-56-43, complicated by severe taxol aches and by neutropenia day 11, gCSF support added. She has needed IVF/ IV antiemetics on ~ day 4 additionally.  CT CAP 05-23-14 showed peritoneal involvement such that avastin is to be added to chemotherapy beginning cycle 4.   Review of systems as above, also: No fever or symptoms of infection. Slight tightness in chest at times, may be related to environmental allergies. Still salty taste. Cramping in calf earlier in week. Remainder of 10 point Review of Systems negative.  Objective:  Vital signs in last 24 hours:  BP 121/69  Pulse 72  Temp(Src) 98.1 F (36.7 C) (Oral)  Resp 20  Ht 5\' 7"  (1.702 m)  Wt 181 lb 1.6 oz (82.146 kg)  BMI 28.36 kg/m2 Weight is up 2 lbs. Alert, oriented and appropriate. Ambulatory without difficulty. Looks somewhat pale, but overall comfortable Alopecia  HEENT:PERRL, sclerae not icteric. Oral mucosa a little dry without lesions, posterior pharynx clear.  Neck supple. No JVD.  Lymphatics:no cervical,supraclavicular, axillary or inguinal adenopathy Resp: clear to auscultation bilaterally and normal percussion bilaterally Cardio: regular rate and rhythm. No gallop. GI: soft, nontender, not distended, no mass or organomegaly. Normally active bowel sounds. Surgical incision not remarkable. Colostomy on left with soft brown stool.  Musculoskeletal/ Extremities: without pitting edema, cords, tenderness including calves. Neuro: sensory peripheral neuropathy toes bilaterally. Otherwise nonfocal. PSYCH  Appropriate mood and affect Skin without rash, ecchymosis, petechiae Portacath-without erythema or tenderness  Lab Results:  Results for orders placed in visit on 08/03/14  CBC WITH DIFFERENTIAL      Result Value Ref Range   WBC 3.9  3.9 - 10.3 10e3/uL   NEUT# 2.8  1.5 - 6.5 10e3/uL   HGB 10.5 (*) 11.6 - 15.9 g/dL   HCT 30.7 (*) 34.8 - 46.6 %   Platelets 184  145 - 400 10e3/uL   MCV 91.4  79.5 - 101.0 fL   MCH 31.3   25.1 - 34.0 pg   MCHC 34.2  31.5 - 36.0 g/dL   RBC 3.36 (*) 3.70 - 5.45 10e6/uL   RDW 15.2 (*) 11.2 - 14.5 %   lymph# 0.7 (*) 0.9 - 3.3 10e3/uL   MONO# 0.3  0.1 - 0.9 10e3/uL   Eosinophils Absolute 0.0  0.0 - 0.5 10e3/uL   Basophils Absolute 0.0  0.0 - 0.1 10e3/uL   NEUT% 71.9  38.4 - 76.8 %   LYMPH% 18.3  14.0 - 49.7 %   MONO% 8.5  0.0 - 14.0 %   EOS% 0.5  0.0 - 7.0 %   BASO% 0.8  0.0 - 2.0 %  BASIC METABOLIC PANEL (PI95)      Result Value Ref Range   Sodium 141  136 - 145 mEq/L   Potassium 3.9  3.5 - 5.1 mEq/L   Chloride 108  98 - 109 mEq/L   CO2 26  22 - 29 mEq/L   Glucose 123  70 - 140 mg/dl   BUN 21.2  7.0 - 26.0 mg/dL   Creatinine 0.9  0.6 - 1.1 mg/dL   Calcium 9.6  8.4 - 10.4 mg/dL   Anion Gap 7  3 - 11 mEq/L     Studies/Results:  No results found. She will have CT AP shortly prior to Dr Elenora Gamma appointment on 08-31-14.  Medications: I have reviewed the patient's current medications. Will try  gabapentin 100 mg at hs  DISCUSSION: could increase gabapentin up to 300 mg tid if helpful, tho will start at lower dose just at hs and work up if needed.   Assessment/Plan:  1.IIIC carcinosarcoma of ovary: post optimal debulking including rectosigmoid resection and diverting loop colostomy at [facility] 02-15-14. Evidence of peritoneal disease post 3 cycles of taxol carboplatin, with plan to add avastin beginning cycle 4 on 06-03-14. Will hold treatment at least until repeat CT prior to gyn oncology reevaluation, tho may need to continue with some treatment depending on those results.  2. Apparent environmental allergies: no indication for antibiotics now. Continue claritin. 3.PAC placed at [facility], will need flush every 6-8 weeks if not used. This should be used for CT upcoming. 4.hypothyroid on replacement, post partial thyroidectomy  5.HTN  6.acute renal insufficiency around recent surgery, resolved.  7.flu vaccine given  8. 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 post laparoscopic cholecystectomy Jan 2015  10.colostomy at time of surgery by gyn oncology, which she hopes can be reversed.   I have scheduled my next appointment in early Dec to coordinate with PAC flush, but can move this earlier if needed. Patient understands plan as above and knows to call if concerns prior to next appointments. Time spent 25 min including >50% discussion and coordination of care.    Oluwateniola Leitch P, MD   08/03/2014, 2:55 PM

## 2014-08-04 ENCOUNTER — Encounter: Payer: Self-pay | Admitting: Oncology

## 2014-08-04 NOTE — Progress Notes (Signed)
[facility] END OF TREATMENT   Name: [name] Date: 08/04/2014 MRN: [id number] DOB: 1946-08-03   TREATMENT DATES: 03-25-14 thru 07-15-14   REFERRING PHYSICIAN: Nancy Marus  DIAGNOSIS: ovarian carcinosarcoma  STAGE AT START OF TREATMENT: [id number]   INTENT: curative   DRUGS OR REGIMENS GIVEN: carboplatin taxol x 6 cycles, with addition of avastin cycles 4-5-6   MAJOR TOXICITIES: fatigue, taxol aches, cytopenias, peripheral neuropathy in feet   REASON TREATMENT STOPPED: completion of planned course   PERFORMANCE STATUS AT END: 1   ONGOING PROBLEMS: neuropathy in feet, fatigue   FOLLOW UP PLANS: CT and reevaluation by gyn oncology

## 2014-08-15 ENCOUNTER — Ambulatory Visit (HOSPITAL_COMMUNITY)
Admission: RE | Admit: 2014-08-15 | Discharge: 2014-08-15 | Disposition: A | Discharge status: Home / Self Care | Payer: Medicare Other | Source: Ambulatory Visit | Attending: Oncology | Admitting: Oncology

## 2014-08-15 ENCOUNTER — Encounter (HOSPITAL_COMMUNITY): Payer: Self-pay

## 2014-08-15 DIAGNOSIS — C569 Malignant neoplasm of unspecified ovary: Secondary | ICD-10-CM | POA: Diagnosis present

## 2014-08-15 DIAGNOSIS — Z9221 Personal history of antineoplastic chemotherapy: Secondary | ICD-10-CM | POA: Insufficient documentation

## 2014-08-15 HISTORY — DX: Malignant (primary) neoplasm, unspecified: C80.1

## 2014-08-15 MED ORDER — IOHEXOL 300 MG/ML  SOLN
100.0000 mL | Freq: Once | INTRAMUSCULAR | Status: AC | PRN
  Administered 2014-08-15: 100 mL via INTRAVENOUS

## 2014-08-18 ENCOUNTER — Telehealth: Payer: Self-pay

## 2014-08-18 DIAGNOSIS — R300 Dysuria: Secondary | ICD-10-CM

## 2014-08-18 DIAGNOSIS — C561 Malignant neoplasm of right ovary: Secondary | ICD-10-CM

## 2014-08-18 MED ORDER — HYDROMORPHONE HCL 2 MG PO TABS: 2.0000 mg | ORAL_TABLET | Freq: Four times a day (QID) | ORAL | Status: DC | PRN

## 2014-08-18 NOTE — Telephone Encounter (Signed)
Ms. [name] called and requested refill on Dilaudid.  She has had some increased pain in right leg since CT scan 08-15-14. Prescription to Injection nurse for paitient to pick up this after noon. Copy of Ct report to [facility] cross NP Gyn ONC.  Appt. With Dr. Alycia Rossetti on 08-31-14.   Dr. Alycia Rossetti here at [facility] today for clinic.

## 2014-08-19 ENCOUNTER — Emergency Department (HOSPITAL_COMMUNITY): Payer: Medicare Other

## 2014-08-19 ENCOUNTER — Encounter (HOSPITAL_COMMUNITY): Payer: Self-pay | Admitting: Emergency Medicine

## 2014-08-19 ENCOUNTER — Emergency Department (HOSPITAL_COMMUNITY)
Admission: EM | Admit: 2014-08-19 | Discharge: 2014-08-19 | Disposition: A | Discharge status: Home / Self Care | Payer: Medicare Other | Attending: Emergency Medicine | Admitting: Emergency Medicine

## 2014-08-19 DIAGNOSIS — Z862 Personal history of diseases of the blood and blood-forming organs and certain disorders involving the immune mechanism: Secondary | ICD-10-CM | POA: Insufficient documentation

## 2014-08-19 DIAGNOSIS — Z90710 Acquired absence of both cervix and uterus: Secondary | ICD-10-CM | POA: Diagnosis not present

## 2014-08-19 DIAGNOSIS — J45909 Unspecified asthma, uncomplicated: Secondary | ICD-10-CM | POA: Insufficient documentation

## 2014-08-19 DIAGNOSIS — Z8543 Personal history of malignant neoplasm of ovary: Secondary | ICD-10-CM | POA: Diagnosis not present

## 2014-08-19 DIAGNOSIS — Z91040 Latex allergy status: Secondary | ICD-10-CM | POA: Insufficient documentation

## 2014-08-19 DIAGNOSIS — Z79899 Other long term (current) drug therapy: Secondary | ICD-10-CM | POA: Diagnosis not present

## 2014-08-19 DIAGNOSIS — R109 Unspecified abdominal pain: Secondary | ICD-10-CM

## 2014-08-19 DIAGNOSIS — Z8719 Personal history of other diseases of the digestive system: Secondary | ICD-10-CM | POA: Insufficient documentation

## 2014-08-19 DIAGNOSIS — Z9049 Acquired absence of other specified parts of digestive tract: Secondary | ICD-10-CM | POA: Insufficient documentation

## 2014-08-19 DIAGNOSIS — Z9889 Other specified postprocedural states: Secondary | ICD-10-CM | POA: Insufficient documentation

## 2014-08-19 DIAGNOSIS — N23 Unspecified renal colic: Secondary | ICD-10-CM

## 2014-08-19 DIAGNOSIS — I10 Essential (primary) hypertension: Secondary | ICD-10-CM | POA: Insufficient documentation

## 2014-08-19 DIAGNOSIS — E079 Disorder of thyroid, unspecified: Secondary | ICD-10-CM | POA: Insufficient documentation

## 2014-08-19 LAB — URINALYSIS, ROUTINE W REFLEX MICROSCOPIC
BILIRUBIN URINE: NEGATIVE
Glucose, UA: NEGATIVE mg/dL
KETONES UR: NEGATIVE mg/dL
NITRITE: NEGATIVE
Protein, ur: NEGATIVE mg/dL
SPECIFIC GRAVITY, URINE: 1.02 (ref 1.005–1.030)
Urobilinogen, UA: 0.2 mg/dL (ref 0.0–1.0)
pH: 5 (ref 5.0–8.0)

## 2014-08-19 LAB — COMPREHENSIVE METABOLIC PANEL
ALT: 12 U/L (ref 0–35)
AST: 18 U/L (ref 0–37)
Albumin: 3.8 g/dL (ref 3.5–5.2)
Alkaline Phosphatase: 51 U/L (ref 39–117)
Anion gap: 15 (ref 5–15)
BUN: 27 mg/dL — ABNORMAL HIGH (ref 6–23)
CHLORIDE: 106 meq/L (ref 96–112)
CO2: 24 meq/L (ref 19–32)
CREATININE: 1.01 mg/dL (ref 0.50–1.10)
Calcium: 9.3 mg/dL (ref 8.4–10.5)
GFR calc Af Amer: 64 mL/min — ABNORMAL LOW (ref >90)
GFR, EST NON AFRICAN AMERICAN: 55 mL/min — AB (ref >90)
Glucose, Bld: 145 mg/dL — ABNORMAL HIGH (ref 70–99)
Potassium: 4 mEq/L (ref 3.7–5.3)
SODIUM: 145 meq/L (ref 137–147)
Total Protein: 7.3 g/dL (ref 6.0–8.3)

## 2014-08-19 LAB — CBC WITH DIFFERENTIAL/PLATELET
BASOS ABS: 0 10*3/uL (ref 0.0–0.1)
BASOS PCT: 0 % (ref 0–1)
Eosinophils Absolute: 0 10*3/uL (ref 0.0–0.7)
Eosinophils Relative: 0 % (ref 0–5)
HCT: 31.5 % — ABNORMAL LOW (ref 36.0–46.0)
Hemoglobin: 10.6 g/dL — ABNORMAL LOW (ref 12.0–15.0)
LYMPHS PCT: 6 % — AB (ref 12–46)
Lymphs Abs: 0.6 10*3/uL — ABNORMAL LOW (ref 0.7–4.0)
MCH: 31.4 pg (ref 26.0–34.0)
MCHC: 33.7 g/dL (ref 30.0–36.0)
MCV: 93.2 fL (ref 78.0–100.0)
Monocytes Absolute: 1 10*3/uL (ref 0.1–1.0)
Monocytes Relative: 9 % (ref 3–12)
NEUTROS ABS: 8.9 10*3/uL — AB (ref 1.7–7.7)
NEUTROS PCT: 85 % — AB (ref 43–77)
PLATELETS: 252 10*3/uL (ref 150–400)
RBC: 3.38 MIL/uL — ABNORMAL LOW (ref 3.87–5.11)
RDW: 14.6 % (ref 11.5–15.5)
WBC: 10.5 10*3/uL (ref 4.0–10.5)

## 2014-08-19 LAB — URINE MICROSCOPIC-ADD ON

## 2014-08-19 LAB — LIPASE, BLOOD: LIPASE: 13 U/L (ref 11–59)

## 2014-08-19 MED ORDER — HEPARIN SOD (PORK) LOCK FLUSH 100 UNIT/ML IV SOLN
500.0000 [IU] | Freq: Once | INTRAVENOUS | Status: AC
  Administered 2014-08-19: 500 [IU]
  Filled 2014-08-19: qty 5

## 2014-08-19 MED ORDER — ONDANSETRON HCL 4 MG/2ML IJ SOLN
4.0000 mg | Freq: Once | INTRAMUSCULAR | Status: AC
  Administered 2014-08-19: 4 mg via INTRAVENOUS
  Filled 2014-08-19: qty 2

## 2014-08-19 MED ORDER — HYDROMORPHONE HCL 1 MG/ML IJ SOLN
1.0000 mg | Freq: Once | INTRAMUSCULAR | Status: AC
  Administered 2014-08-19: 1 mg via INTRAVENOUS
  Filled 2014-08-19: qty 1

## 2014-08-19 NOTE — ED Notes (Signed)
Upon returning from CT patient is actively vomiting and rates pain 6/10. Dr. Roxanne Mins made aware of same.

## 2014-08-19 NOTE — ED Notes (Signed)
Pt attempted to transfer to wheel chair for toileting and began to vomit. Pt stated she would like to wait a little bit longer before going to the bathroom to see if her stomach settles down some.   Pt was also changed into a hospital gown during the same rounding period.

## 2014-08-19 NOTE — ED Notes (Signed)
Pt states she has ovarian cancer and had a scan on Monday to see if the chemotherapy is working Pt states Monday night she started having some pain in the right flank area and that pain has progressively gotten worse since  Pt states about an hour or two ago she started having nausea and vomiting  Pt states the pain is in the right flank and radiates to the groin area and down the right leg

## 2014-08-19 NOTE — ED Provider Notes (Signed)
CSN: [id number]     Arrival date & time 08/19/14  0320 History   First MD Initiated Contact with Patient 08/19/14 [phone]     Chief Complaint  Patient presents with  . Flank Pain     (Consider location/radiation/quality/duration/timing/severity/associated sxs/prior Treatment) Patient is a 68 y.o. female presenting with flank pain. The history is provided by the patient.  Flank Pain  She complains of right flank pain which started 3 days ago and has been worsening. Pain is severe she rates it 10/10. It radiates into the right lower abdomen and into the groin and even into the right leg. There is associated nausea and vomiting. She denies fever or chills. She has noted some urinary hesitancy tonight. She was given a prescription for hydromorphone which did give him temporary relief of pain. She is concerned that she might have a kidney stone. Nothing makes pain better nothing makes it worse.  Past Medical History  Diagnosis Date  . Thyroid disease   . Hypertension   . Anemia   . Asthma   . Hiatal hernia   . Cancer 02/2014    ovarian ca   Past Surgical History  Procedure Laterality Date  . Tonsillectomy    . Abdominal hysterectomy    . Thyroidectomy, partial    . Cholecystectomy N/A 11/22/2013    Procedure: LAPAROSCOPIC CHOLECYSTECTOMY WITH INTRAOPERATIVE CHOLANGIOGRAM;  Surgeon: Gwenyth Ober, MD;  Location: [place];  Service: General;  Laterality: N/A;  . Colostomy     Family History  Problem Relation Age of Onset  . Cancer Father     lung   History  Substance Use Topics  . Smoking status: Never Smoker   . Smokeless tobacco: Never Used  . Alcohol Use: No   OB History   Grav Para Term Preterm Abortions TAB SAB Ect Mult Living                 Review of Systems  Genitourinary: Positive for flank pain.  All other systems reviewed and are negative.     Allergies  Epinephrine; Codeine; Demerol; Latex; Macrodantin; Micardis; Protonix; and Sulfa antibiotics  Home  Medications   Prior to Admission medications   Medication Sig Start Date End Date Taking? Authorizing Provider  amLODipine (NORVASC) 5 MG tablet Take 5 mg by mouth daily.    Historical Provider, MD  gabapentin (NEURONTIN) 100 MG capsule Take 1 capsule (100 mg total) by mouth at bedtime. 08/03/14   Lennis Marion Downer, MD  HYDROmorphone (DILAUDID) 2 MG tablet Take 1 tablet (2 mg total) by mouth every 6 (six) hours as needed for severe pain. 08/18/14   Melissa D Cross, NP  ibuprofen (ADVIL,MOTRIN) 200 MG tablet Take 200 mg by mouth every 6 (six) hours as needed.    Historical Provider, MD  levothyroxine (SYNTHROID, LEVOTHROID) 75 MCG tablet Take 75 mcg by mouth daily before breakfast.    Historical Provider, MD  lidocaine-prilocaine (EMLA) cream Apply 1 application topically as needed. 03/14/14   Lennis Marion Downer, MD  loratadine (CLARITIN) 10 MG tablet Take 10 mg by mouth daily as needed for allergies.    Historical Provider, MD  LORazepam (ATIVAN) 1 MG tablet Place 1 tablet under the tongue or swallow every 6 hours as needed for nausea or to relax after chemotherapy. 04/25/14   Lennis Marion Downer, MD  metoprolol succinate (TOPROL-XL) 50 MG 24 hr tablet Take 50 mg by mouth daily. Take with or immediately following a meal.    Historical Provider,  MD  ondansetron (ZOFRAN) 8 MG tablet Take 1 tablet the morning after chemo then take 1 every 8 hrs as needed for nausea/vomiting 03/25/14   Lennis Marion Downer, MD  promethazine (PHENERGAN) 25 MG suppository Place 1 suppository (25 mg total) rectally every 6 (six) hours as needed for nausea or vomiting. 05/06/14   Lennis Marion Downer, MD  promethazine (PHENERGAN) 25 MG tablet Take 0.5-1 tablets (12.5-25 mg total) by mouth every 6 (six) hours as needed for nausea or vomiting. 03/09/14   Lennis Marion Downer, MD  ranitidine (ZANTAC) 75 MG tablet Take 75 mg by mouth daily.    Historical Provider, MD   BP 149/64  Pulse 60  Temp(Src) 98.1 F (36.7 C) (Oral)  Resp 16  SpO2  100% Physical Exam  Nursing note and vitals reviewed.  68 year old female, resting comfortably and in no acute distress. Vital signs are significant for hypertension. Oxygen saturation is 100%, which is normal. Head is normocephalic and atraumatic. PERRLA, EOMI. Oropharynx is clear. Neck is nontender and supple without adenopathy or JVD. Back is nontender in the midline. There is moderate right CVA tenderness. Straight leg raise is negative. Lungs are clear without rales, wheezes, or rhonchi. Chest is nontender. Heart has regular rate and rhythm without murmur. Abdomen is soft, flat, nontender without masses or hepatosplenomegaly and peristalsis is hypoactive. Extremities have no cyanosis or edema, full range of motion is present. Skin is warm and dry without rash. Neurologic: Mental status is normal, cranial nerves are intact, there are no motor or sensory deficits.  ED Course  Procedures (including critical care time) Labs Review Results for orders placed during the hospital encounter of 08/19/14  CBC WITH DIFFERENTIAL      Result Value Ref Range   WBC 10.5  4.0 - 10.5 K/uL   RBC 3.38 (*) 3.87 - 5.11 MIL/uL   Hemoglobin 10.6 (*) 12.0 - 15.0 g/dL   HCT 31.5 (*) 36.0 - 46.0 %   MCV 93.2  78.0 - 100.0 fL   MCH 31.4  26.0 - 34.0 pg   MCHC 33.7  30.0 - 36.0 g/dL   RDW 14.6  11.5 - 15.5 %   Platelets 252  150 - 400 K/uL   Neutrophils Relative % 85 (*) 43 - 77 %   Neutro Abs 8.9 (*) 1.7 - 7.7 K/uL   Lymphocytes Relative 6 (*) 12 - 46 %   Lymphs Abs 0.6 (*) 0.7 - 4.0 K/uL   Monocytes Relative 9  3 - 12 %   Monocytes Absolute 1.0  0.1 - 1.0 K/uL   Eosinophils Relative 0  0 - 5 %   Eosinophils Absolute 0.0  0.0 - 0.7 K/uL   Basophils Relative 0  0 - 1 %   Basophils Absolute 0.0  0.0 - 0.1 K/uL  COMPREHENSIVE METABOLIC PANEL      Result Value Ref Range   Sodium 145  137 - 147 mEq/L   Potassium 4.0  3.7 - 5.3 mEq/L   Chloride 106  96 - 112 mEq/L   CO2 24  19 - 32 mEq/L   Glucose,  Bld 145 (*) 70 - 99 mg/dL   BUN 27 (*) 6 - 23 mg/dL   Creatinine, Ser 1.01  0.50 - 1.10 mg/dL   Calcium 9.3  8.4 - 10.5 mg/dL   Total Protein 7.3  6.0 - 8.3 g/dL   Albumin 3.8  3.5 - 5.2 g/dL   AST 18  0 - 37 U/L  ALT 12  0 - 35 U/L   Alkaline Phosphatase 51  39 - 117 U/L   Total Bilirubin <0.2 (*) 0.3 - 1.2 mg/dL   GFR calc non Af Amer 55 (*) >90 mL/min   GFR calc Af Amer 64 (*) >90 mL/min   Anion gap 15  5 - 15  LIPASE, BLOOD      Result Value Ref Range   Lipase 13  11 - 59 U/L  URINALYSIS, ROUTINE W REFLEX MICROSCOPIC      Result Value Ref Range   Color, Urine YELLOW  YELLOW   APPearance CLOUDY (*) CLEAR   Specific Gravity, Urine 1.020  1.005 - 1.030   pH 5.0  5.0 - 8.0   Glucose, UA NEGATIVE  NEGATIVE mg/dL   Hgb urine dipstick LARGE (*) NEGATIVE   Bilirubin Urine NEGATIVE  NEGATIVE   Ketones, ur NEGATIVE  NEGATIVE mg/dL   Protein, ur NEGATIVE  NEGATIVE mg/dL   Urobilinogen, UA 0.2  0.0 - 1.0 mg/dL   Nitrite NEGATIVE  NEGATIVE   Leukocytes, UA SMALL (*) NEGATIVE  URINE MICROSCOPIC-ADD ON      Result Value Ref Range   Squamous Epithelial / LPF RARE  RARE   WBC, UA 7-10  <3 WBC/hpf   RBC / HPF 11-20  <3 RBC/hpf   Bacteria, UA FEW (*) RARE   Crystals URIC ACID CRYSTALS (*) NEGATIVE   Urine-Other MUCOUS PRESENT     Imaging Review Ct Renal Stone Study  08/19/2014   CLINICAL DATA:  Right flank pain and lower abdominal pain. Hematuria. Nausea. History of ovarian cancer. Finished chemotherapy 4 weeks ago.  EXAM: CT ABDOMEN AND PELVIS WITHOUT CONTRAST  TECHNIQUE: Multidetector CT imaging of the abdomen and pelvis was performed following the standard protocol without IV contrast.  COMPARISON:  08/15/2014  FINDINGS: Mild dependent changes in the lung bases. Right anterior cardiophrenic lymph node and right lung base nodules are stable since prior study.  There is bilateral hydronephrosis and right hydroureter with stranding around the kidneys. The right ureter is dilated all the  way down to the ureterovesical junction. No ureteral stones are demonstrated but there is a 3 mm stone demonstrated in the bladder suggesting a recently passed stone. Intrarenal stones demonstrated on the left. No left ureteral stones appreciated.  The unenhanced appearance of the liver, spleen, pancreas, adrenal glands, abdominal aorta, and inferior vena cava is unremarkable. Surgical absence of the gallbladder. Persistent prominent nodular peritoneal implants particularly along the inferior liver edge but also throughout the mesentery. Retroperitoneal lymph nodes are not pathologically enlarged. There is a diverting tight left lower quadrant colostomy. Peritoneal mesenteric nodules extend out into the colostomy. Colon and small bowel are decompressed. No free fluid in the abdomen.  Pelvis: Presacral mass or lymphadenopathy is similar to previous study. Bilateral iliac chain pelvic lymphadenopathy again demonstrated. Peritoneal implants in the pelvis. Surgical clips with surgical resection of the uterus. Degenerative changes in the lumbar spine. No destructive bone lesions appreciated.  IMPRESSION: Right hydronephrosis and hydroureter with recently passed 3 mm stone in the bladder. Left pyelocaliectasis without obstructing ureteral stone. Nonobstructing left intrarenal stones. Metastatic disease with at nodules in the right lung base, right cardiophrenic angle, peritoneum, mesenteric, pelvis, and pelvic lymph nodes. Metastatic disease is similar to previous study.   Electronically Signed   By: Lucienne Capers M.D.   On: 08/19/2014 06:14   Images viewed by me.  MDM   Final diagnoses:  Right flank pain  Ureteral colic  Right flank pain worrisome for possible ureterolithiasis. Records reviewed and she did have a CT scan 3 days ago but this was with contrast do believe there is some calculi in the right renal pelvis. She will be sent for CT without contrast and urinalysis will be obtained.  She had  incomplete pain relief and nausea relief with initial dose of 1x5 and hydromorphone. These need to be repeated. CT shows right-sided hydronephrosis with calculus and apparently has just passed into the bladder. I expect that she should do well now that the calculus has passed. This was explained to the patient. She has hydromorphone at home for pain, and she has ondansetron and promethazine at home for nausea. She is discharged and advised to use his medications as needed and followup with her urologist as needed.  Delora Fuel, MD 80/22/33 [id number]

## 2014-08-19 NOTE — ED Notes (Signed)
Patient has port located on right upper chest. Unable to locate if x ray to confirm placement was done at a [facility] facility. Patient had port placed at [facility]. Dr. Roxanne Mins made aware of same. No orders for chest x ray to confirm placement of port.

## 2014-08-19 NOTE — ED Notes (Addendum)
Pt reports R flank and lower abdomen pain, also sts difficulty urinating ("feels like not emptying my bladder completely"). Pt sts she noticed some blood in her urine last night. Hx of ovarian cancer, last chemo about 4 weeks ago.

## 2014-08-19 NOTE — Discharge Instructions (Signed)
Take your hydromorphone (Dilaudid) as needed for pain. Take your ondansetron (Zofran) and promethazine (Phenergan) as needed for nausea.  Kidney Stones Kidney stones (urolithiasis) are deposits that form inside your kidneys. The intense pain is caused by the stone moving through the urinary tract. When the stone moves, the ureter goes into spasm around the stone. The stone is usually passed in the urine.  CAUSES   A disorder that makes certain neck glands produce too much parathyroid hormone (primary hyperparathyroidism).  A buildup of uric acid crystals, similar to gout in your joints.  Narrowing (stricture) of the ureter.  A kidney obstruction present at birth (congenital obstruction).  Previous surgery on the kidney or ureters.  Numerous kidney infections. SYMPTOMS   Feeling sick to your stomach (nauseous).  Throwing up (vomiting).  Blood in the urine (hematuria).  Pain that usually spreads (radiates) to the groin.  Frequency or urgency of urination. DIAGNOSIS   Taking a history and physical exam.  Blood or urine tests.  CT scan.  Occasionally, an examination of the inside of the urinary bladder (cystoscopy) is performed. TREATMENT   Observation.  Increasing your fluid intake.  Extracorporeal shock wave lithotripsy--This is a noninvasive procedure that uses shock waves to break up kidney stones.  Surgery may be needed if you have severe pain or persistent obstruction. There are various surgical procedures. Most of the procedures are performed with the use of small instruments. Only small incisions are needed to accommodate these instruments, so recovery time is minimized. The size, location, and chemical composition are all important variables that will determine the proper choice of action for you. Talk to your health care provider to better understand your situation so that you will minimize the risk of injury to yourself and your kidney.  HOME CARE INSTRUCTIONS     Drink enough water and fluids to keep your urine clear or pale yellow. This will help you to pass the stone or stone fragments.  Strain all urine through the provided strainer. Keep all particulate matter and stones for your health care provider to see. The stone causing the pain may be as small as a grain of salt. It is very important to use the strainer each and every time you pass your urine. The collection of your stone will allow your health care provider to analyze it and verify that a stone has actually passed. The stone analysis will often identify what you can do to reduce the incidence of recurrences.  Only take over-the-counter or prescription medicines for pain, discomfort, or fever as directed by your health care provider.  Make a follow-up appointment with your health care provider as directed.  Get follow-up X-rays if required. The absence of pain does not always mean that the stone has passed. It may have only stopped moving. If the urine remains completely obstructed, it can cause loss of kidney function or even complete destruction of the kidney. It is your responsibility to make sure X-rays and follow-ups are completed. Ultrasounds of the kidney can show blockages and the status of the kidney. Ultrasounds are not associated with any radiation and can be performed easily in a matter of minutes. SEEK MEDICAL CARE IF:  You experience pain that is progressive and unresponsive to any pain medicine you have been prescribed. SEEK IMMEDIATE MEDICAL CARE IF:   Pain cannot be controlled with the prescribed medicine.  You have a fever or shaking chills.  The severity or intensity of pain increases over 18 hours and is  not relieved by pain medicine.  You develop a new onset of abdominal pain.  You feel faint or pass out.  You are unable to urinate. MAKE SURE YOU:   Understand these instructions.  Will watch your condition.  Will get help right away if you are not doing well  or get worse. Document Released: 10/14/2005 Document Revised: 06/16/2013 Document Reviewed: 03/17/2013 [facility] Patient Information 2015 [place], [place]. This information is not intended to replace advice given to you by your health care provider. Make sure you discuss any questions you have with your health care provider.

## 2014-08-27 ENCOUNTER — Encounter (HOSPITAL_COMMUNITY): Payer: Self-pay | Admitting: Emergency Medicine

## 2014-08-27 ENCOUNTER — Emergency Department (HOSPITAL_COMMUNITY)
Admission: EM | Admit: 2014-08-27 | Discharge: 2014-08-27 | Disposition: A | Discharge status: Home / Self Care | Payer: Medicare Other | Attending: Emergency Medicine | Admitting: Emergency Medicine

## 2014-08-27 DIAGNOSIS — Z8543 Personal history of malignant neoplasm of ovary: Secondary | ICD-10-CM | POA: Diagnosis not present

## 2014-08-27 DIAGNOSIS — R110 Nausea: Secondary | ICD-10-CM | POA: Insufficient documentation

## 2014-08-27 DIAGNOSIS — E079 Disorder of thyroid, unspecified: Secondary | ICD-10-CM | POA: Diagnosis not present

## 2014-08-27 DIAGNOSIS — Z862 Personal history of diseases of the blood and blood-forming organs and certain disorders involving the immune mechanism: Secondary | ICD-10-CM | POA: Diagnosis not present

## 2014-08-27 DIAGNOSIS — Z91040 Latex allergy status: Secondary | ICD-10-CM | POA: Diagnosis not present

## 2014-08-27 DIAGNOSIS — Z9049 Acquired absence of other specified parts of digestive tract: Secondary | ICD-10-CM | POA: Insufficient documentation

## 2014-08-27 DIAGNOSIS — Z90710 Acquired absence of both cervix and uterus: Secondary | ICD-10-CM | POA: Insufficient documentation

## 2014-08-27 DIAGNOSIS — R109 Unspecified abdominal pain: Secondary | ICD-10-CM | POA: Insufficient documentation

## 2014-08-27 DIAGNOSIS — R6883 Chills (without fever): Secondary | ICD-10-CM | POA: Diagnosis not present

## 2014-08-27 DIAGNOSIS — Z79899 Other long term (current) drug therapy: Secondary | ICD-10-CM | POA: Diagnosis not present

## 2014-08-27 DIAGNOSIS — I10 Essential (primary) hypertension: Secondary | ICD-10-CM | POA: Insufficient documentation

## 2014-08-27 DIAGNOSIS — Z8719 Personal history of other diseases of the digestive system: Secondary | ICD-10-CM | POA: Insufficient documentation

## 2014-08-27 DIAGNOSIS — Z87448 Personal history of other diseases of urinary system: Secondary | ICD-10-CM | POA: Diagnosis not present

## 2014-08-27 DIAGNOSIS — J45909 Unspecified asthma, uncomplicated: Secondary | ICD-10-CM | POA: Insufficient documentation

## 2014-08-27 HISTORY — DX: Disorder of kidney and ureter, unspecified: N28.9

## 2014-08-27 LAB — URINALYSIS, ROUTINE W REFLEX MICROSCOPIC
BILIRUBIN URINE: NEGATIVE
Glucose, UA: NEGATIVE mg/dL
KETONES UR: NEGATIVE mg/dL
Nitrite: NEGATIVE
Protein, ur: NEGATIVE mg/dL
Specific Gravity, Urine: 1.018 (ref 1.005–1.030)
UROBILINOGEN UA: 0.2 mg/dL (ref 0.0–1.0)
pH: 5.5 (ref 5.0–8.0)

## 2014-08-27 LAB — BASIC METABOLIC PANEL
Anion gap: 15 (ref 5–15)
BUN: 20 mg/dL (ref 6–23)
CALCIUM: 9.5 mg/dL (ref 8.4–10.5)
CO2: 24 meq/L (ref 19–32)
CREATININE: 1.17 mg/dL — AB (ref 0.50–1.10)
Chloride: 104 mEq/L (ref 96–112)
GFR calc Af Amer: 54 mL/min — ABNORMAL LOW (ref >90)
GFR, EST NON AFRICAN AMERICAN: 46 mL/min — AB (ref >90)
GLUCOSE: 114 mg/dL — AB (ref 70–99)
Potassium: 3.9 mEq/L (ref 3.7–5.3)
Sodium: 143 mEq/L (ref 137–147)

## 2014-08-27 LAB — CBC
HEMATOCRIT: 31.5 % — AB (ref 36.0–46.0)
Hemoglobin: 10.6 g/dL — ABNORMAL LOW (ref 12.0–15.0)
MCH: 30.8 pg (ref 26.0–34.0)
MCHC: 33.7 g/dL (ref 30.0–36.0)
MCV: 91.6 fL (ref 78.0–100.0)
PLATELETS: 269 10*3/uL (ref 150–400)
RBC: 3.44 MIL/uL — ABNORMAL LOW (ref 3.87–5.11)
RDW: 14 % (ref 11.5–15.5)
WBC: 12.3 10*3/uL — ABNORMAL HIGH (ref 4.0–10.5)

## 2014-08-27 LAB — URINE MICROSCOPIC-ADD ON

## 2014-08-27 MED ORDER — PROMETHAZINE HCL 12.5 MG PO TABS: 12.5000 mg | ORAL_TABLET | Freq: Four times a day (QID) | ORAL | Status: DC | PRN

## 2014-08-27 MED ORDER — PROMETHAZINE HCL 25 MG/ML IJ SOLN
12.5000 mg | Freq: Once | INTRAMUSCULAR | Status: AC
  Administered 2014-08-27: 12.5 mg via INTRAVENOUS
  Filled 2014-08-27: qty 1

## 2014-08-27 MED ORDER — HEPARIN SOD (PORK) LOCK FLUSH 100 UNIT/ML IV SOLN
500.0000 [IU] | Freq: Once | INTRAVENOUS | Status: AC
  Administered 2014-08-27: 500 [IU]
  Filled 2014-08-27: qty 5

## 2014-08-27 MED ORDER — HYDROMORPHONE HCL 1 MG/ML IJ SOLN
1.0000 mg | Freq: Once | INTRAMUSCULAR | Status: AC
  Administered 2014-08-27: 1 mg via INTRAVENOUS
  Filled 2014-08-27: qty 1

## 2014-08-27 MED ORDER — HYDROMORPHONE HCL 1 MG/ML IJ SOLN
0.5000 mg | Freq: Once | INTRAMUSCULAR | Status: AC
  Administered 2014-08-27: 0.5 mg via INTRAVENOUS
  Filled 2014-08-27: qty 1

## 2014-08-27 MED ORDER — SODIUM CHLORIDE 0.9 % IV BOLUS (SEPSIS)
1000.0000 mL | INTRAVENOUS | Status: AC
  Administered 2014-08-27: 1000 mL via INTRAVENOUS

## 2014-08-27 MED ORDER — HYDROMORPHONE HCL 2 MG PO TABS: 1.0000 mg | ORAL_TABLET | Freq: Four times a day (QID) | ORAL | Status: DC | PRN

## 2014-08-27 MED ORDER — KETOROLAC TROMETHAMINE 30 MG/ML IJ SOLN
30.0000 mg | Freq: Once | INTRAMUSCULAR | Status: AC
  Administered 2014-08-27: 30 mg via INTRAVENOUS
  Filled 2014-08-27: qty 1

## 2014-08-27 MED ORDER — OXYCODONE-ACETAMINOPHEN 5-325 MG PO TABS: 1.0000 | ORAL_TABLET | Freq: Four times a day (QID) | ORAL | Status: DC | PRN

## 2014-08-27 NOTE — ED Provider Notes (Signed)
CSN: [id number]     Arrival date & time 08/27/14  1659 History   First MD Initiated Contact with Patient 08/27/14 1738     Chief Complaint  Patient presents with  . Flank Pain     (Consider location/radiation/quality/duration/timing/severity/associated sxs/prior Treatment) Patient is a 68 y.o. female presenting with flank pain. The history is provided by the patient.  Flank Pain This is a recurrent problem. The current episode started yesterday. Episode frequency: intermittent. The problem has been gradually worsening. Pertinent negatives include no chest pain, no abdominal pain, no headaches and no shortness of breath. Nothing aggravates the symptoms. Nothing relieves the symptoms. She has tried nothing for the symptoms. The treatment provided no relief.    Past Medical History  Diagnosis Date  . Thyroid disease   . Hypertension   . Anemia   . Asthma   . Hiatal hernia   . Cancer 02/2014    ovarian ca  . Renal disorder    Past Surgical History  Procedure Laterality Date  . Tonsillectomy    . Abdominal hysterectomy    . Thyroidectomy, partial    . Cholecystectomy N/A 11/22/2013    Procedure: LAPAROSCOPIC CHOLECYSTECTOMY WITH INTRAOPERATIVE CHOLANGIOGRAM;  Surgeon: Gwenyth Ober, MD;  Location: [place];  Service: General;  Laterality: N/A;  . Colostomy     Family History  Problem Relation Age of Onset  . Cancer Father     lung   History  Substance Use Topics  . Smoking status: Never Smoker   . Smokeless tobacco: Never Used  . Alcohol Use: No   OB History   Grav Para Term Preterm Abortions TAB SAB Ect Mult Living                 Review of Systems  Constitutional: Positive for chills. Negative for fever and fatigue.  HENT: Negative for congestion and drooling.   Eyes: Negative for pain.  Respiratory: Negative for cough and shortness of breath.   Cardiovascular: Negative for chest pain.  Gastrointestinal: Positive for nausea. Negative for abdominal pain and diarrhea.   Genitourinary: Positive for flank pain. Negative for dysuria and hematuria.  Musculoskeletal: Negative for back pain, gait problem and neck pain.  Skin: Negative for color change.  Neurological: Negative for dizziness and headaches.  Hematological: Negative for adenopathy.  Psychiatric/Behavioral: Negative for behavioral problems.  All other systems reviewed and are negative.     Allergies  Epinephrine; Codeine; Demerol; Latex; Macrodantin; Micardis; Protonix; and Sulfa antibiotics  Home Medications   Prior to Admission medications   Medication Sig Start Date End Date Taking? Authorizing Provider  amLODipine (NORVASC) 5 MG tablet Take 5 mg by mouth daily.   Yes Historical Provider, MD  BIOTIN PO Take 1 tablet by mouth daily.   Yes Historical Provider, MD  gabapentin (NEURONTIN) 100 MG capsule Take 1 capsule (100 mg total) by mouth at bedtime. 08/03/14  Yes Lennis Marion Downer, MD  HYDROmorphone (DILAUDID) 2 MG tablet Take 1 tablet (2 mg total) by mouth every 6 (six) hours as needed for severe pain. 08/18/14  Yes Melissa D Cross, NP  ibuprofen (ADVIL,MOTRIN) 200 MG tablet Take 200 mg by mouth every 6 (six) hours as needed for headache or mild pain.    Yes Historical Provider, MD  levothyroxine (SYNTHROID, LEVOTHROID) 75 MCG tablet Take 75 mcg by mouth daily before breakfast.   Yes Historical Provider, MD  lidocaine-prilocaine (EMLA) cream Apply 1 application topically as needed. 03/14/14  Yes Lennis Marion Downer, MD  loratadine (CLARITIN) 10 MG tablet Take 10 mg by mouth daily as needed for allergies.   Yes Historical Provider, MD  metoprolol succinate (TOPROL-XL) 50 MG 24 hr tablet Take 50 mg by mouth daily. Take with or immediately following a meal.   Yes Historical Provider, MD  Multiple Vitamin (MULTIVITAMIN WITH MINERALS) TABS tablet Take 1 tablet by mouth daily.   Yes Historical Provider, MD  ondansetron (ZOFRAN) 8 MG tablet Take 1 tablet the morning after chemo then take 1 every 8 hrs as  needed for nausea/vomiting 03/25/14  Yes [place], MD  OVER THE COUNTER MEDICATION Place 1 each into both eyes 2 (two) times daily. Pure Moist.   Yes Historical Provider, MD  promethazine (PHENERGAN) 25 MG suppository Place 1 suppository (25 mg total) rectally every 6 (six) hours as needed for nausea or vomiting. 05/06/14  Yes Lennis Marion Downer, MD  promethazine (PHENERGAN) 25 MG tablet Take 0.5-1 tablets (12.5-25 mg total) by mouth every 6 (six) hours as needed for nausea or vomiting. 03/09/14  Yes Lennis P Livesay, MD  ranitidine (ZANTAC) 75 MG tablet Take 75 mg by mouth daily.   Yes Historical Provider, MD   BP 151/58  Pulse 69  Temp(Src) 99.2 F (37.3 C) (Oral)  Resp 18  SpO2 100% Physical Exam  Nursing note and vitals reviewed. Constitutional: She is oriented to person, place, and time. She appears well-developed and well-nourished.  HENT:  Head: Normocephalic.  Mouth/Throat: Oropharynx is clear and moist. No oropharyngeal exudate.  Eyes: Conjunctivae and EOM are normal. Pupils are equal, round, and reactive to light.  Neck: Normal range of motion. Neck supple.  Cardiovascular: Normal rate, regular rhythm, normal heart sounds and intact distal pulses.  Exam reveals no gallop and no friction rub.   No murmur heard. Pulmonary/Chest: Effort normal and breath sounds normal. No respiratory distress. She has no wheezes.  Abdominal: Soft. Bowel sounds are normal. There is no tenderness. There is no rebound and no guarding.   Pain is not reproducible on exam. Normal appearing colostomy w/ pink stoma.   Musculoskeletal: Normal range of motion. She exhibits no edema and no tenderness.  Neurological: She is alert and oriented to person, place, and time.  Skin: Skin is warm and dry.  Psychiatric: She has a normal mood and affect. Her behavior is normal.    ED Course  Procedures (including critical care time) Labs Review Labs Reviewed  BASIC METABOLIC PANEL - Abnormal; Notable for the  following:    Glucose, Bld 114 (*)    Creatinine, Ser 1.17 (*)    GFR calc non Af Amer 46 (*)    GFR calc Af Amer 54 (*)    All other components within normal limits  CBC - Abnormal; Notable for the following:    WBC 12.3 (*)    RBC 3.44 (*)    Hemoglobin 10.6 (*)    HCT 31.5 (*)    All other components within normal limits  URINALYSIS, ROUTINE W REFLEX MICROSCOPIC - Abnormal; Notable for the following:    APPearance CLOUDY (*)    Hgb urine dipstick MODERATE (*)    Leukocytes, UA SMALL (*)    All other components within normal limits  URINE MICROSCOPIC-ADD ON - Abnormal; Notable for the following:    Crystals URIC ACID CRYSTALS (*)    All other components within normal limits    Imaging Review No results found.   EKG Interpretation None      MDM  Final diagnoses:  Right flank pain    6:21 PM 68 y.o. female w hx of ovarian cancer s/p chemo several weeks ago  Who presents with right flank pain. She was seen here on 10/23  With right flank pain and found to have a 3 mm calculus in her bladder. She states that she has done well since that time. She had sudden onset right flank pain for about 2 hours yesterday and then again this morning which has persisted throughout the day. The pain is consistent with her previous visit for nephrolithiasis. She has also had some nausea and chills. Will get screening lab work. I do not think repeat imaging would change mgmt at this time. Will get symptom control.  Vital signs unremarkable here.  9:06 PM: I interpreted/reviewed the labs and/or imaging which were non-contributory.  Mildly elev wbc. Pt's pain controlled.   I have discussed the diagnosis/risks/treatment options with the patient and believe the pt to be eligible for discharge home to follow-up with her urologist as needed. We also discussed returning to the ED immediately if new or worsening sx occur. We discussed the sx which are most concerning (e.g., fever, worsening pain) that  necessitate immediate return. Medications administered to the patient during their visit and any new prescriptions provided to the patient are listed below.  Medications given during this visit Medications  sodium chloride 0.9 % bolus 1,000 mL (0 mLs Intravenous Stopped 08/27/14 1911)  HYDROmorphone (DILAUDID) injection 1 mg (1 mg Intravenous Given 08/27/14 1811)  promethazine (PHENERGAN) injection 12.5 mg (12.5 mg Intravenous Given 08/27/14 1811)  HYDROmorphone (DILAUDID) injection 0.5 mg (0.5 mg Intravenous Given 08/27/14 1936)  ketorolac (TORADOL) 30 MG/ML injection 30 mg (30 mg Intravenous Given 08/27/14 1937)    New Prescriptions   OXYCODONE-ACETAMINOPHEN (PERCOCET) 5-325 MG PER TABLET    Take 1-2 tablets by mouth every 6 (six) hours as needed for severe pain.   PROMETHAZINE (PHENERGAN) 12.5 MG TABLET    Take 1 tablet (12.5 mg total) by mouth every 6 (six) hours as needed for nausea or vomiting.     Pamella Pert, MD 08/28/14 0000

## 2014-08-27 NOTE — ED Notes (Signed)
Pt is aware that a urine sample is needed.  

## 2014-08-27 NOTE — ED Notes (Signed)
Per pt, states she was here about a week ago with same symptoms-diagnosed with kidney stone-thought it had passed but started having pain this am

## 2014-08-31 ENCOUNTER — Encounter: Payer: Self-pay | Admitting: Gynecologic Oncology

## 2014-08-31 ENCOUNTER — Other Ambulatory Visit: Payer: Self-pay | Admitting: Oncology

## 2014-08-31 ENCOUNTER — Ambulatory Visit: Payer: Medicare Other | Attending: Gynecologic Oncology | Admitting: Gynecologic Oncology

## 2014-08-31 VITALS — BP 136/55 | Resp 18 | Ht 67.0 in | Wt 182.4 lb

## 2014-08-31 DIAGNOSIS — Z9049 Acquired absence of other specified parts of digestive tract: Secondary | ICD-10-CM | POA: Insufficient documentation

## 2014-08-31 DIAGNOSIS — C569 Malignant neoplasm of unspecified ovary: Secondary | ICD-10-CM | POA: Insufficient documentation

## 2014-08-31 DIAGNOSIS — E079 Disorder of thyroid, unspecified: Secondary | ICD-10-CM | POA: Insufficient documentation

## 2014-08-31 DIAGNOSIS — Z90710 Acquired absence of both cervix and uterus: Secondary | ICD-10-CM | POA: Insufficient documentation

## 2014-08-31 DIAGNOSIS — I10 Essential (primary) hypertension: Secondary | ICD-10-CM | POA: Insufficient documentation

## 2014-08-31 DIAGNOSIS — Z801 Family history of malignant neoplasm of trachea, bronchus and lung: Secondary | ICD-10-CM | POA: Insufficient documentation

## 2014-08-31 NOTE — Progress Notes (Deleted)
Consult Note: Gyn-Onc   [name] 68 y.o. female  Chief Complaint  Patient presents with  . Ovarian Cancer    Assessment : Stage IIIc carcinosarcoma of the ovary. Status post 3 cycles of carboplatin and Taxol chemotherapy. CA 125 has fallen although recent CT scan shows persistent peritoneal disease.  Plan: The patient's course, CA 125 values, and recent CT scan were reviewed with the patient and her daughter. Questions are answered. I would recommend that the patient continue her current chemotherapy regimen. However I think adding a Avastin would be reasonable strategy given the persistent and measurable peritoneal disease. The patient is scheduled for cycle for the next Friday.  Following completion of chemotherapy patient would like to have her colostomy reversed. I indicated to the patient and her daughter that they would need to wait 6 weeks following the last dose of a Avastin before taking surgical intervention.  Patient return to see GYN oncology following completion of her 6 cycle of chemotherapy  Interval History: The patient presents today having received 3 cycles of carboplatin and Taxol chemotherapy. Her last cycle was administered on July 10. Review of records shows the patient has a fall in CA 125. Preoperatively was 67 units per mL and on July 10 was 14 units per mL. A recent CT scan for reassessment shows persistent peritoneal disease.  Patient's had some toxicity associated with chemotherapy but she reports that her last cycle was better than previously. She has minimal peripheral neuropathy. She does have some joint and bone pain. Her weight fell following surgery but is now stabilized her last 6 weeks.  HPI: patient initially presented with a large heterogeneous mass measuring 19 x 13 x 14 cm. she underwent surgical debulking at [facility] on 02/15/2014. She was optimally debulked although the residual less than 1 cm nodules on the peritoneum at the completion surgical  procedure. She had a low rectal anastomosis and a diverting loop colostomy.  Review of Systems:10 point review of systems is negative except as noted in interval history.   Vitals: Blood pressure 136/55, resp. rate 18, height 5\' 7"  (1.702 m), weight 182 lb 6.4 oz (82.736 kg).  Physical Exam:  Not performed today.     Allergies  Allergen Reactions  . Epinephrine Anaphylaxis  . Codeine Other (See Comments)    "ill"  . Demerol [Meperidine] Other (See Comments)    "ill"  . Latex Itching    Says gloves "used" to cause itching.. Not sure today if problem persists.  Clancy Gourd [Nitrofurantoin Macrocrystal] Other (See Comments)    "made her feel reclusive"  . Micardis [Telmisartan] Other (See Comments)    "didn't move for 6 hours"  . Protonix [Pantoprazole Sodium] Other (See Comments)    "ill"  . Sulfa Antibiotics Other (See Comments)    "blind spell"    Past Medical History  Diagnosis Date  . Thyroid disease   . Hypertension   . Anemia   . Asthma   . Hiatal hernia   . Cancer 02/2014    ovarian ca  . Renal disorder     Past Surgical History  Procedure Laterality Date  . Tonsillectomy    . Abdominal hysterectomy    . Thyroidectomy, partial    . Cholecystectomy N/A 11/22/2013    Procedure: LAPAROSCOPIC CHOLECYSTECTOMY WITH INTRAOPERATIVE CHOLANGIOGRAM;  Surgeon: Gwenyth Ober, MD;  Location: [facility];  Service: General;  Laterality: N/A;  . Colostomy      Current Outpatient Prescriptions  Medication Sig Dispense  Refill  . amLODipine (NORVASC) 5 MG tablet Take 5 mg by mouth daily.    Marland Kitchen BIOTIN PO Take 1 tablet by mouth daily.    Marland Kitchen gabapentin (NEURONTIN) 100 MG capsule Take 1 capsule (100 mg total) by mouth at bedtime. 30 capsule 2  . levothyroxine (SYNTHROID, LEVOTHROID) 75 MCG tablet Take 75 mcg by mouth daily before breakfast.    . metoprolol succinate (TOPROL-XL) 50 MG 24 hr tablet Take 50 mg by mouth daily. Take with or immediately following a meal.    . ranitidine  (ZANTAC) 75 MG tablet Take 75 mg by mouth daily.    Marland Kitchen HYDROmorphone (DILAUDID) 2 MG tablet Take 1 tablet (2 mg total) by mouth every 6 (six) hours as needed for severe pain. 30 tablet 0  . ibuprofen (ADVIL,MOTRIN) 200 MG tablet Take 200 mg by mouth every 6 (six) hours as needed for headache or mild pain.     Marland Kitchen lidocaine-prilocaine (EMLA) cream Apply 1 application topically as needed. 30 g 0  . loratadine (CLARITIN) 10 MG tablet Take 10 mg by mouth daily as needed for allergies.    . Multiple Vitamin (MULTIVITAMIN WITH MINERALS) TABS tablet Take 1 tablet by mouth daily.    . ondansetron (ZOFRAN) 8 MG tablet Take 1 tablet the morning after chemo then take 1 every 8 hrs as needed for nausea/vomiting 30 tablet 1  . OVER THE COUNTER MEDICATION Place 1 each into both eyes 2 (two) times daily. Pure Moist.    . promethazine (PHENERGAN) 12.5 MG tablet Take 1 tablet (12.5 mg total) by mouth every 6 (six) hours as needed for nausea or vomiting. 20 tablet 0  . promethazine (PHENERGAN) 25 MG suppository Place 1 suppository (25 mg total) rectally every 6 (six) hours as needed for nausea or vomiting. 4 each 1  . promethazine (PHENERGAN) 25 MG tablet Take 0.5-1 tablets (12.5-25 mg total) by mouth every 6 (six) hours as needed for nausea or vomiting. 30 tablet 1   No current facility-administered medications for this visit.    History   Social History  . Marital Status: Widowed    Spouse Name: N/A    Number of Children: N/A  . Years of Education: N/A   Occupational History  . Not on file.   Social History Main Topics  . Smoking status: Never Smoker   . Smokeless tobacco: Never Used  . Alcohol Use: No  . Drug Use: No  . Sexual Activity: Not Currently   Other Topics Concern  . Not on file   Social History Narrative    Family History  Problem Relation Age of Onset  . Cancer Father     lung      Jordan Caraveo A., MD 08/31/2014, 11:30 AM

## 2014-08-31 NOTE — Progress Notes (Signed)
Consult Note: Gyn-Onc  [name] 68 y.o. female  CC:  Chief Complaint  Patient presents with  . Ovarian Cancer    HPI: Patient 68 year old with a history of hypertension and hypothyroidism. She underwent a laparoscopic cholecystectomy in January 2015 at that revealed no evidence of tumor. She did well initially but then began experiencing the same type of pain that she had prior to her cholecystectomy. She describes the pain as in her abdomen with pressure radiating under her right shoulder blade and that has returned. The pain has not increased in significant severity until the past week and a half. She presented to the emergency room with abdominal pain on 02/03/2014 was admitted to the hospital. A CT scan of the abdomen and pelvis was performed that revealed:  A large heterogeneous mass measuring approximately 19.4 x 13.6 x 14.3 cm is seen within the lower mid abdomen/pelvis. The uterus and ovaries are not definitely visualized. The bladder is compressed and displaced anteriorly by this collection. A few loops of bowel are seen interspersed within this collection along its right lateral aspect. There is abnormal intermediate density fluid/soft tissues seen along the right pericolic gutter. Additionally, nodular peritoneal thickening seen along the left pericolic gutter is well. Tiny soft tissue densities seen within the omental fat of the left anterior hemi abdomen.  No free intraperitoneal air. No enlarged intra-abdominal pelvic lymph nodes. No acute osseous abnormality. No worrisome lytic or blastic osseous lesions.  IMPRESSION:  1. Large heterogeneous mixed attenuation mass within the lower mid pelvis as above. Finding is concerning for malignancy, with possible sarcomatous lesions and/or ovarian tumor in the differential (per EPIC, the patient is status post hysterectomy, it is unknown at this time whether ovaries were resected at that time as well). Further evaluation with MRI  recommended.  2. Small volume ascites adjacent to the liver and spleen.  3. Additional fluid/soft tissue density along the pericolic gutters bilaterally and left anterior abdomen are suspicious for peritoneal implants.  4. Mild bilateral hydronephrosis, likely related to compression of the large pelvic mass.  5. Small right pleural effusion.  She also had an MRI performed that revealed: FINDINGS:  As demonstrated on the CT scan there is a large complex pelvic mass extending up into the lower abdomen. It is a bizarre appearing multi cystic and hemorrhagic lesion with fluid fluid levels throughout.  Very heterogeneous irregular contrast enhancement is noted around and throughout the lesion. I suspect this is a large cystic an hemorrhagic ovarian mass. The patient has had a hysterectomy but I  do not see that the ovaries were removed. No adenopathy is identified. There is mass effect on the colon and small bowel. No inguinal mass or adenopathy. The bony structures are unremarkable. There is a small amount of free pelvic fluid.  IMPRESSION:  Large multi septated cystic and hemorrhagic pelvic mass. An ovarian tumor would be most likely.  She went to exploratory laparotomy on 02-15-14, with findings of 20 cm mass filling pelvis and involving rectosigmoid and bilateral ovaries and tubes, with extension of tumor deep in pelvis and along rectum. Surgery was BSO/omentectomy and rectosigmoid resection with reanastomosis was performed, then diverting loop colostomy due to intraoperative concerns about the proximal rectal stump. She had bilateral ureterolysis for retroperitoneal fibrosis. At completion of procedure, which was optimal debulking, there were scattered <1 cm nodules on peritoneum, small bowel and small bowel mesentery, with no appreciable upper abdominal disease. She was transfused 2 units PRBCs intraoperatively. [facility] [phone]  from 02-15-2014) found high grade carcinosarcoma 17 cm  originating in one ovary which was adherent to multiple structures, metastatic disease in 1.8 cm peritoneal nodule as well as sigmoid colon nodule 1.5 cm and in omentum, no lymph nodes submitted, no identified LVSI, margin of one end of colon positive, The colon specimen also had adenomatous polyp without high grade dysplasia.  She underwent 3 cycles of paclitaxel and carboplatin based chemotherapy and had progression of disease. Avastin was then added for an additional 3 cycles were total of 6 cycles of paclitaxel and carboplatin and 3 cycles of bevacizumab. Her last cycle of chemotherapy was September 18. CT scan (08/15/14)  after 6 cycles revealed:  EXAM: CT ABDOMEN AND PELVIS WITH CONTRAST  TECHNIQUE: Multidetector CT imaging of the abdomen and pelvis was performed using the standard protocol following bolus administration of intravenous contrast.  CONTRAST: 176mL OMNIPAQUE IOHEXOL 300 MG/ML SOLN  COMPARISON: Multiple exams, including 05/23/2014  FINDINGS: Lower chest: 1.3 by 0.9 cm right anterior epicardial lymph node.  Hepatobiliary: Increased peritoneal implants of tumor along theliver margin is detailed below. Difficult to completely excludeearly invasion of hepatic parenchyma along the inferior margin ofthe right hepatic lobe.  Pancreas: Unremarkable  Spleen: Unremarkable  Adrenals/Urinary Tract: Left peripelvic and right kidney lower polerenal cysts. Currently no hydronephrosis or hydroureter.  Stomach/Bowel: Left upper quadrant site colostomy. Descending andsigmoid colon diverticulosis. No dilated bowel.  Vascular/Lymphatic: Left pelvic sidewall lymph node short axis diameter 1.6 cm on image 62 series 2, formerly 0.9 cm.  Reproductive: Surgically absent.  Other: Abnormal nodularity along the top of the liver and especially along the falciform ligament and gallbladder fossa and tracking on the medial side at the right hepatic lobe compatible with tumor rind.  This is progressively worsened, with thickness of tumor adjacent to the right hepatic tip at 3.4 cm, previously about 2.1 cm. A centrally fluid density deposit inferior to the right hepatic lobe along the paracolic gutter measures 3.5 by 3.1 cm on image 42 of series 2, formerly 2.4 by 1.9 cm. Presacral tumor 4.9 by 4.7 cm on image 57 series 2, formerly 2.4 by 2.6 cm. Increased tumor deposits along the left inferior pelvis, bilateral anterior pelvis, and pelvic mesentery as well as the vaginal cuff region.  Musculoskeletal: Intact  IMPRESSION: 1. Unfortunately there has been significant progression of the patient's peritoneal metastatic burden. 2. A right anterior epicardial lymph nodes has enlarged and could reflect metastatic disease. There is also on enlarged left pelvicsidewall lymph node and scattered enlarged mesenteric implants of tumor. Increased tumor also infiltrates the presacral space.  Interval History:  She is overall feeling fairly well. She feels that because the CT scan she developed kidney stones a number noted on her CT scan. This past weekend she was in the hospital and past kidney stones on Sunday. She had some hematuria and pain. She actually saw the stones and took him to her physician. The hematuria has cleared now but she continues to feel some soreness in the right lower quadrant. She had some possible blood in her ostomy bag. There is a question also on the mucosal the bowel which is subsequently healed. She does not believe that Zofran works as well for her for nauseous Phenergan doesn't would like to be given Zofran moving forward.  Review of systems is otherwise negative. She denies any chest pain, shortness of breath, nausea, vomiting, fevers, chills. She denies any unintentional weight loss or weight gain. The biggest issues as the kidney stones and  hematuria.  She and I reviewed her CT scan today.  Current Meds:  Outpatient Encounter Prescriptions as of 08/31/2014   Medication Sig  . amLODipine (NORVASC) 5 MG tablet Take 5 mg by mouth daily.  Marland Kitchen BIOTIN PO Take 1 tablet by mouth daily.  Marland Kitchen gabapentin (NEURONTIN) 100 MG capsule Take 1 capsule (100 mg total) by mouth at bedtime.  Marland Kitchen levothyroxine (SYNTHROID, LEVOTHROID) 75 MCG tablet Take 75 mcg by mouth daily before breakfast.  . metoprolol succinate (TOPROL-XL) 50 MG 24 hr tablet Take 50 mg by mouth daily. Take with or immediately following a meal.  . ranitidine (ZANTAC) 75 MG tablet Take 75 mg by mouth daily.  Marland Kitchen HYDROmorphone (DILAUDID) 2 MG tablet Take 1 tablet (2 mg total) by mouth every 6 (six) hours as needed for severe pain.  Marland Kitchen ibuprofen (ADVIL,MOTRIN) 200 MG tablet Take 200 mg by mouth every 6 (six) hours as needed for headache or mild pain.   Marland Kitchen lidocaine-prilocaine (EMLA) cream Apply 1 application topically as needed.  . loratadine (CLARITIN) 10 MG tablet Take 10 mg by mouth daily as needed for allergies.  . Multiple Vitamin (MULTIVITAMIN WITH MINERALS) TABS tablet Take 1 tablet by mouth daily.  . ondansetron (ZOFRAN) 8 MG tablet Take 1 tablet the morning after chemo then take 1 every 8 hrs as needed for nausea/vomiting  . OVER THE COUNTER MEDICATION Place 1 each into both eyes 2 (two) times daily. Pure Moist.  . promethazine (PHENERGAN) 12.5 MG tablet Take 1 tablet (12.5 mg total) by mouth every 6 (six) hours as needed for nausea or vomiting.  . promethazine (PHENERGAN) 25 MG suppository Place 1 suppository (25 mg total) rectally every 6 (six) hours as needed for nausea or vomiting.  . promethazine (PHENERGAN) 25 MG tablet Take 0.5-1 tablets (12.5-25 mg total) by mouth every 6 (six) hours as needed for nausea or vomiting.  . [DISCONTINUED] HYDROmorphone (DILAUDID) 2 MG tablet Take 0.5 tablets (1 mg total) by mouth every 6 (six) hours as needed for moderate pain or severe pain.    Allergy:  Allergies  Allergen Reactions  . Epinephrine Anaphylaxis  . Codeine Other (See Comments)    "ill"  .  Demerol [Meperidine] Other (See Comments)    "ill"  . Latex Itching    Says gloves "used" to cause itching.. Not sure today if problem persists.  Clancy Gourd [Nitrofurantoin Macrocrystal] Other (See Comments)    "made her feel reclusive"  . Micardis [Telmisartan] Other (See Comments)    "didn't move for 6 hours"  . Protonix [Pantoprazole Sodium] Other (See Comments)    "ill"  . Sulfa Antibiotics Other (See Comments)    "blind spell"    Social Hx:   History   Social History  . Marital Status: Widowed    Spouse Name: N/A    Number of Children: N/A  . Years of Education: N/A   Occupational History  . Not on file.   Social History Main Topics  . Smoking status: Never Smoker   . Smokeless tobacco: Never Used  . Alcohol Use: No  . Drug Use: No  . Sexual Activity: Not Currently   Other Topics Concern  . Not on file   Social History Narrative    Past Surgical Hx:  Past Surgical History  Procedure Laterality Date  . Tonsillectomy    . Abdominal hysterectomy    . Thyroidectomy, partial    . Cholecystectomy N/A 11/22/2013    Procedure: LAPAROSCOPIC CHOLECYSTECTOMY WITH INTRAOPERATIVE CHOLANGIOGRAM;  Surgeon: Gwenyth Ober, MD;  Location: [place];  Service: General;  Laterality: N/A;  . Colostomy      Past Medical Hx:  Past Medical History  Diagnosis Date  . Thyroid disease   . Hypertension   . Anemia   . Asthma   . Hiatal hernia   . Cancer 02/2014    ovarian ca  . Renal disorder     Oncology Hx:    Ovarian carcinosarcoma   02/09/2014 Initial Diagnosis Ovarian carcinosarcoma   02/15/2014 Surgery [facility] MMMT ovary   03/25/2014 - 07/15/2014 Chemotherapy paclitaxel and carboplatin x6 with addition of avastin for the last three cycles.   08/15/2014 Progression Increased peritoneal disease by CT scan    Family Hx:  Family History  Problem Relation Age of Onset  . Cancer Father     lung    Vitals:  Blood pressure 136/55, resp. rate 18, height $RemoveBe'5\' 7"'[id number]$  (1.702 m),  weight 182 lb 6.4 oz (82.736 kg).  Physical Exam: Well-nourished well-developed female in no acute distress.  Neck: Supple, no lymphadenopathy, no thyromegaly.  Lungs: Clear to auscultation bilaterally.  Cardiovascular: Regular rate and rhythm.  Abdomen: Well-healed vertical midline incision. There is no evidence of any incisional hernias. She has a left upper quadrant stoma. Abdomen is soft, nontender, nondistended. There are no palpable masses.  Groins: No lymphadenopathy.  Extremities: No edema.  Pelvic: External genitalia within normal limits. Bimanual examination does reveal a 4 cm mass at the superior aspect of the vaginal cuff. There is some tenderness.  Assessment/Plan: 68 year old with progressive stage IIIc ovarian carcinosarcoma. I met with the patient and then her daughter came in with them as well. Greater than 30 minutes face to face time was spent in conjunction with them today discussing the results of the CT scan and next steps. I believe that we should proceed with ifosfamide based on the fact that the carcinosarcoma. I did have the opportunity to speak with Dr. Marko Plume who agrees. I looked up data regarding the combination of ifosfamide and bevacizumab and it appears that this is a regimen that is been used for glioblastoma and should have no contraindications. The patient was provided information regarding the most usual side effects of the ifosfamide and she knows that Dr. Mariana Kaufman office will contact her. We will get her scheduled to be seen by genetics to ensure that there is no BRCA mutation associated with her malignancy. We'll most likely need to proceed with imaging after 2-3 cycles of chemotherapy as CA-125 does not appear to be in appropriate marker for her.  Keliah Harned A., MD 08/31/2014, 1:56 PM

## 2014-08-31 NOTE — Patient Instructions (Signed)
Ifosfamide injection What is this medicine? IFOSFAMIDE (eye FOS fa mide) is a chemotherapy drug. It slows the growth of cancer cells. This medicine is used to treat testicular cancer. This medicine may be used for other purposes; ask your health care provider or pharmacist if you have questions. COMMON BRAND NAME(S): Ifex, Ifex/Mesna What should I tell my health care provider before I take this medicine? They need to know if you have any of these conditions: -bladder problems -blood disorders -dehydration -infection (especially a virus infection such as chickenpox, cold sores, or herpes) -kidney disease -liver disease -recent or ongoing radiation therapy -an unusual or allergic reaction to ifosfamide, other chemotherapy, other medicines, foods, dyes, or preservatives -pregnant or trying to get pregnant -breast-feeding How should I use this medicine? This drug is given as an infusion into a vein. It is administered in a hospital or clinic by a specially trained health care professional. Talk to your pediatrician regarding the use of this medicine in children. Special care may be needed. Overdosage: If you think you have taken too much of this medicine contact a poison control center or emergency room at once. NOTE: This medicine is only for you. Do not share this medicine with others. What if I miss a dose? It is important not to miss your dose. Call your doctor or health care professional if you are unable to keep an appointment. What may interact with this medicine? Do not take this medicine with any of the following medications: -nalidixic acid This medicine may also interact with the following medications: -medicines to increase blood counts like filgrastim, pegfilgrastim, sargramostim -St. John's Wort -vaccines Talk to your doctor or health care professional before taking any of these medicines: -acetaminophen -aspirin -ibuprofen -ketoprofen -naproxen This list may not  describe all possible interactions. Give your health care provider a list of all the medicines, herbs, non-prescription drugs, or dietary supplements you use. Also tell them if you smoke, drink alcohol, or use illegal drugs. Some items may interact with your medicine. What should I watch for while using this medicine? Visit your doctor for checks on your progress. This drug may make you feel generally unwell. This is not uncommon, as chemotherapy can affect healthy cells as well as cancer cells. Report any side effects. Continue your course of treatment even though you feel ill unless your doctor tells you to stop. Drink water or other fluids as directed. Urinate often, even at night. In some cases, you may be given additional medicines to help with side effects. Follow all directions for their use. Call your doctor or health care professional for advice if you get a fever, chills or sore throat, or other symptoms of a cold or flu. Do not treat yourself. This drug decreases your body's ability to fight infections. Try to avoid being around people who are sick. This medicine may increase your risk to bruise or bleed. Call your doctor or health care professional if you notice any unusual bleeding. Be careful brushing and flossing your teeth or using a toothpick because you may get an infection or bleed more easily. If you have any dental work done, tell your dentist you are receiving this medicine. Avoid taking products that contain aspirin, acetaminophen, ibuprofen, naproxen, or ketoprofen unless instructed by your doctor. These medicines may hide a fever. Do not become pregnant while taking this medicine. Women should inform their doctor if they wish to become pregnant or think they might be pregnant. There is a potential for serious side  effects to an unborn child. Talk to your health care professional or pharmacist for more information. Do not breast-feed an infant while taking this medicine. What side  effects may I notice from receiving this medicine? Side effects that you should report to your doctor or health care professional as soon as possible: -allergic reactions like skin rash, itching or hives, swelling of the face, lips, or tongue -low blood counts - this medicine may decrease the number of white blood cells, red blood cells and platelets. You may be at increased risk for infections and bleeding. -signs of infection - fever or chills, cough, sore throat, pain or difficulty passing urine -signs of decreased platelets or bleeding - bruising, pinpoint red spots on the skin, black, tarry stools, blood in the urine -signs of decreased red blood cells - unusually weak or tired, fainting spells, lightheadedness -agitation -breathing problems -confusion -dark urine -hallucinations -mouth sores -pain, swelling, redness at site where injected -seizures -trouble passing urine or change in the amount of urine -yellowing of the eyes or skin Side effects that usually do not require medical attention (report to your doctor or health care professional if they continue or are bothersome): -diarrhea -hair loss -loss of appetite -nausea, vomiting This list may not describe all possible side effects. Call your doctor for medical advice about side effects. You may report side effects to FDA at 1-800-FDA-1088. Where should I keep my medicine? This drug is given in a hospital or clinic and will not be stored at home. NOTE: This sheet is a summary. It may not cover all possible information. If you have questions about this medicine, talk to your doctor, pharmacist, or health care provider.  2015, Elsevier/Gold Standard. (2008-03-01 16:23:05)

## 2014-09-02 ENCOUNTER — Telehealth: Payer: Self-pay | Admitting: Oncology

## 2014-09-02 NOTE — Telephone Encounter (Signed)
s.w pt daughter and advised on NOV appt...ok and aware she will advise mother

## 2014-09-04 ENCOUNTER — Other Ambulatory Visit: Payer: Self-pay | Admitting: Oncology

## 2014-09-04 DIAGNOSIS — C569 Malignant neoplasm of unspecified ovary: Secondary | ICD-10-CM

## 2014-09-05 ENCOUNTER — Telehealth: Payer: Self-pay | Admitting: Oncology

## 2014-09-05 NOTE — Telephone Encounter (Signed)
spoke to pt and adv of appt for 11/16-pt unserstood

## 2014-09-05 NOTE — Telephone Encounter (Signed)
perpof to sch pt appt-cld pt-tele# states not taking calls @ this time-pt coming 11/13-will adv of appt @ that time

## 2014-09-06 ENCOUNTER — Telehealth: Payer: Self-pay | Admitting: *Deleted

## 2014-09-06 NOTE — Telephone Encounter (Signed)
Called pt as noted below by Dr. Marko Plume - did education on the new chemotherapy she will be getting, Ifosfamide, and the bladder protectant agent, Mesna. Told pt that she will be admitted to the [facility] oncology floor for approximately 5 days for this chemotherapy. Reviewed side effects of this regimen with patient as noted in the handouts. Pt wrote down information. Told patient we will give her a copy of drug information at her visit with Dr. Marko Plume 09/09/14. Told pt to bring any questions or concerns she has for Dr. Marko Plume. Pt agreeable to this.

## 2014-09-06 NOTE — Telephone Encounter (Signed)
-----   Message from Gordy Levan, MD sent at 09/04/2014  3:10 PM EST ----- She is to see me on 11-13. She needs teaching on ifosfamide, by phone before I see her would be fine.  x5 days in hospital for treatment May add avastin to this  CC Idell Pickles Thank you!

## 2014-09-09 ENCOUNTER — Other Ambulatory Visit (HOSPITAL_BASED_OUTPATIENT_CLINIC_OR_DEPARTMENT_OTHER): Payer: Medicare Other

## 2014-09-09 ENCOUNTER — Encounter: Payer: Self-pay | Admitting: Oncology

## 2014-09-09 ENCOUNTER — Other Ambulatory Visit: Payer: Self-pay | Admitting: Oncology

## 2014-09-09 ENCOUNTER — Ambulatory Visit (HOSPITAL_BASED_OUTPATIENT_CLINIC_OR_DEPARTMENT_OTHER): Payer: Medicare Other | Admitting: Oncology

## 2014-09-09 ENCOUNTER — Other Ambulatory Visit: Payer: Medicare Other

## 2014-09-09 VITALS — BP 140/58 | HR 75 | Temp 98.3°F | Resp 18 | Ht 67.0 in | Wt 182.3 lb

## 2014-09-09 DIAGNOSIS — C569 Malignant neoplasm of unspecified ovary: Secondary | ICD-10-CM

## 2014-09-09 DIAGNOSIS — D649 Anemia, unspecified: Secondary | ICD-10-CM

## 2014-09-09 DIAGNOSIS — I10 Essential (primary) hypertension: Secondary | ICD-10-CM

## 2014-09-09 DIAGNOSIS — E039 Hypothyroidism, unspecified: Secondary | ICD-10-CM

## 2014-09-09 LAB — CBC WITH DIFFERENTIAL/PLATELET
BASO%: 0.6 % (ref 0.0–2.0)
Basophils Absolute: 0 10*3/uL (ref 0.0–0.1)
EOS%: 1.8 % (ref 0.0–7.0)
Eosinophils Absolute: 0.1 10*3/uL (ref 0.0–0.5)
HEMATOCRIT: 31.8 % — AB (ref 34.8–46.6)
HGB: 10.5 g/dL — ABNORMAL LOW (ref 11.6–15.9)
LYMPH%: 15.4 % (ref 14.0–49.7)
MCH: 30.3 pg (ref 25.1–34.0)
MCHC: 32.9 g/dL (ref 31.5–36.0)
MCV: 92.1 fL (ref 79.5–101.0)
MONO#: 0.4 10*3/uL (ref 0.1–0.9)
MONO%: 8 % (ref 0.0–14.0)
NEUT%: 74.2 % (ref 38.4–76.8)
NEUTROS ABS: 3.9 10*3/uL (ref 1.5–6.5)
Platelets: 251 10*3/uL (ref 145–400)
RBC: 3.45 10*6/uL — ABNORMAL LOW (ref 3.70–5.45)
RDW: 14.2 % (ref 11.2–14.5)
WBC: 5.3 10*3/uL (ref 3.9–10.3)
lymph#: 0.8 10*3/uL — ABNORMAL LOW (ref 0.9–3.3)

## 2014-09-09 LAB — COMPREHENSIVE METABOLIC PANEL (CC13)
ALT: 12 U/L (ref 0–55)
ANION GAP: 9 meq/L (ref 3–11)
AST: 20 U/L (ref 5–34)
Albumin: 3.8 g/dL (ref 3.5–5.0)
Alkaline Phosphatase: 53 U/L (ref 40–150)
BUN: 18.1 mg/dL (ref 7.0–26.0)
CHLORIDE: 106 meq/L (ref 98–109)
CO2: 29 meq/L (ref 22–29)
CREATININE: 1.1 mg/dL (ref 0.6–1.1)
Calcium: 9.7 mg/dL (ref 8.4–10.4)
Glucose: 132 mg/dl (ref 70–140)
Potassium: 4 mEq/L (ref 3.5–5.1)
SODIUM: 143 meq/L (ref 136–145)
TOTAL PROTEIN: 7.1 g/dL (ref 6.4–8.3)
Total Bilirubin: 0.38 mg/dL (ref 0.20–1.20)

## 2014-09-09 NOTE — Progress Notes (Signed)
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 She had 6 cycles of carbo taxol from 03-25-14 thru 07-15-14, with avastin added for last 3 cycles. CT AP 08-15-14 showed increased disease at liver surface and into right hepatic lobe, presacral involvement and in pelvis otherwise. She saw Dr Alycia Rossetti on 08-31-14, who recommended further chemotherapy, possibly ifosfamide.   She passed at least 3 kidney stones 10-23 and 08-27-14, evaluated in ED and referred to Dr Louis Meckel. Stone analysis is in process and she is drinking lemonade daily to adjust urine pH. She has history of one kidney stone age 68 and possibly one stone ~ 10 years ago.  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 She describes numbness in distal feet and "toes drawing" at times. She has some discomfort in RUQ at times, denies other pain. Appetite is good and bowels are moving. She denies increased SOB, LE swelling, any bleeding including hematuria now.  She has PAC, used and flushed 08-27-14.  flu vaccine done She is scheduled with genetics counselor 09-12-14. Unfortunately not appropriate to reverse colostomy.  Daughter plans to move in with patient, with her 67 yo daughter Stanton Kidney. Daughter's 51 yo Brooke already lives with patient.  ONCOLOGIC HISTORY   Ovarian carcinosarcoma   02/09/2014 Initial Diagnosis Ovarian carcinosarcoma   02/15/2014 Surgery IIIC MMMT ovary   03/25/2014 - 07/15/2014 Chemotherapy paclitaxel and carboplatin x6 with addition of avastin for the last three cycles.   08/15/2014 Progression Increased peritoneal disease by CT scan  Patient had been in usual generally good health,  status post remote hysterectomy, until abdominal pain with radiation to right shoulder in Jan 2015. She was admitted thru ED 11-21-13 with [place] evidence of cholelithiasis, tho no signs of cholecystitis then. (Note patient tells me that she had CT in Jan, however this EMR has only abdominal US then). She was taken to laparoscopic cholecystectomy by Dr Hulen Skains on 11-22-13, with multiple large stones in specimen and benign pathology. Her post operative course was not remarkable and the pain initially resolved entirely, with patient feeling well at post op visit in mid Feb. By ~March she had more abdominal cramping and dull pain, with abdominal distension and was treated with cipro for apparent UTI. She was seen in ED on 02-03-14 with concern for recurrent UTI, with abdominal symptoms as above + intermittent nausea and vomiting, with abdomen tender on exam then. CT AP found large heterogeneous mass lower mid abdomen/ pelvis 19.4 x 13.6 x 14.3 cm with soft tissue along pericolic gutters and left anterior abdomen, with mild bilateral hydronephrosis and small right pleural effusion. CA 125 resulted at 67.9 and CEA was 1.0. She saw Dr Alycia Rossetti on 02-09-14 and was hospitalized at [facility] from 02-11-14 thru 02-25-14. She went to exploratory laparotomy on 02-15-14, with findings of 20 cm mass filling pelvis and involving rectosigmoid and bilateral ovaries and tubes, with extension of tumor deep in pelvis and along rectum. Surgery was BSO/omentectomy and rectosigmoid resection with reanastomosis was performed, then diverting loop colostomy due to intraoperative concerns about the proximal rectal stump. She had bilateral ureterolysis for retroperitoneal fibrosis. At completion of procedure, which was optimal debulking, there were scattered <1 cm nodules on peritoneum, small bowel and small bowel  mesentery, with no appreciable upper abdominal disease. She was transfused 2 units PRBCs intraoperatively. [facility] [phone] from 02-15-2014) found  high grade carcinosarcoma 17 cm originating in one ovary which was adherent to multiple structures, metastatic disease in 1.8 cm peritoneal nodule as well as sigmoid colon nodule 1.5 cm and in omentum, no lymph nodes submitted, no identified LVSI, margin of one end of colon positive, The colon specimen also had adenomatous polyp without high grade dysplasia.  She was hospitalized at [facility] [place] from 02-11-14 thru 02-25-14. Preoperatively she had acute kidney injury, with increase in creatinine from baseline 0.7 to high of 2.17; creatinine postoperatively was 1.52. She needed NG following surgery.  PAC was placed at [facility] on 02-21-14.  Recommendations from [facility] multidisciplinary conference 02-23-2014 was for chemotherapy with platinum and taxane. She had cycle 1 taxol carboplatin on 9-92-42, complicated by severe taxol aches and by neutropenia day 11, gCSF support added. She has needed IVF/ IV antiemetics on ~ day 4 additionally.  CT CAP 05-23-14 showed peritoneal involvement such that avastin was added to chemotherapy beginning cycle 4. She completed 6 cycles of carbo taxol 07-15-14. CT AP 08-15-14 showed further progression along liver, presacral and deposits in pelvis.  Review of systems as above, also: No problems with PAC. Energy generally good. Bladder ok. Colostomy functioning without difficulty. Slight nasal congestion with clear drainage, consistent with environmental allergies. Remainder of 10 point Review of Systems negative.  Objective:  Vital signs in last 24 hours:  BP 140/58 mmHg  Pulse 75  Temp(Src) 98.3 F (36.8 C) (Oral)  Resp 18  Ht 5' 7" (1.702 m)  Wt 182 lb 4.8 oz (82.691 kg)  BMI 28.55 kg/m2 Weight up 1 lb. Alert, oriented and appropriate. Ambulatory without difficulty. Looks comfortable, very pleasant as always, daughter very supportive.   HEENT:PERRL, sclerae not icteric. Oral mucosa moist without lesions, posterior pharynx clear.  Neck supple. No JVD.  Lymphatics:no  cervical,supraclavicular adenopathy Resp: clear to auscultation bilaterally and normal percussion bilaterally Cardio: regular rate and rhythm. No gallop. GI: soft, nontender, not distended, no mass or organomegaly. Normally active bowel sounds. Colostomy not remarkable Musculoskeletal/ Extremities: without pitting edema, cords, tenderness Neuro:  peripheral neuropathy in feet as above. Otherwise nonfocal. PSYCH appropriate mood and affect Skin without rash, ecchymosis, petechiae Portacath-without erythema or tenderness  Lab Results:  Results for orders placed or performed in visit on 09/09/14  CBC with Differential  Result Value Ref Range   WBC 5.3 3.9 - 10.3 10e3/uL   NEUT# 3.9 1.5 - 6.5 10e3/uL   HGB 10.5 (L) 11.6 - 15.9 g/dL   HCT 31.8 (L) 34.8 - 46.6 %   Platelets 251 145 - 400 10e3/uL   MCV 92.1 79.5 - 101.0 fL   MCH 30.3 25.1 - 34.0 pg   MCHC 32.9 31.5 - 36.0 g/dL   RBC 3.45 (L) 3.70 - 5.45 10e6/uL   RDW 14.2 11.2 - 14.5 %   lymph# 0.8 (L) 0.9 - 3.3 10e3/uL   MONO# 0.4 0.1 - 0.9 10e3/uL   Eosinophils Absolute 0.1 0.0 - 0.5 10e3/uL   Basophils Absolute 0.0 0.0 - 0.1 10e3/uL   NEUT% 74.2 38.4 - 76.8 %   LYMPH% 15.4 14.0 - 49.7 %   MONO% 8.0 0.0 - 14.0 %   EOS% 1.8 0.0 - 7.0 %   BASO% 0.6 0.0 - 2.0 %  Comprehensive metabolic panel (Cmet) - [facility]  Result Value Ref Range   Sodium 143 136 - 145 mEq/L   Potassium  4.0 3.5 - 5.1 mEq/L   Chloride 106 98 - 109 mEq/L   CO2 29 22 - 29 mEq/L   Glucose 132 70 - 140 mg/dl   BUN 18.1 7.0 - 26.0 mg/dL   Creatinine 1.1 0.6 - 1.1 mg/dL   Total Bilirubin 0.38 0.20 - 1.20 mg/dL   Alkaline Phosphatase 53 40 - 150 U/L   AST 20 5 - 34 U/L   ALT 12 0 - 55 U/L   Total Protein 7.1 6.4 - 8.3 g/dL   Albumin 3.8 3.5 - 5.0 g/dL   Calcium 9.7 8.4 - 10.4 mg/dL   Anion Gap 9 3 - 11 mEq/L     Studies/Results:  No results found.  Medications: I have reviewed the patient's current medications. She cannot tell that gabapentin is helping  neuropathy in feet, so will try without that unless symptoms worse when stopped. She will need to be on B6 with doxil.   DISCUSSION: Patient had teaching by RN for ifosfamide + MESNA prior to visit today, and teaching by RN for doxil after discussion with MD. I have discussed rationale for trying ifosfamide, as this is frequently used for sarcomas, and has reported overall response rate of ~ 15% with uterine carcinosarcoma. Ifos would require ~ 6 days total in hospital for each 5 day cycle, with possible side effects including renal and neurologic problems. Patient tells me that she was not eager to spend that amount of time in hospital even prior to knowing that response rate is generally this low.  Genetics counseling scheduled for 09-12-14, as olaparib may be consideration if BRCA +. Gyn onc NP is following up with request for [facility] testing on previous path from [place]. We have discussed option of treating as with progressive gyn carcinoma, including doxil, oral etoposide, weekly taxotere, topotecan etc. She would prefer this to inpatient ifosfamide, recognizing that none of these agents would be expected to have better chance of success than the ifosfamide. We have discussed doxil, and she has had teaching by MD and by RN for this. Doxil could be given every 3-4 weeks; she will need echocardiogram for EF prior to start of treatment (history HTN).  I have offered second opinion if she would like, which she does not request now.  She is willing to have treatment any weekday. We will continue avastin with doxil. Verbal consent obtained.  Assessment/Plan: 1.IIIC carcinosarcoma of ovary: post optimal debulking including rectosigmoid resection and diverting loop colostomy at [facility] 02-15-14. Evidence of peritoneal disease post 3 cycles of taxol carboplatin, with avastin added for next 3 cycles. Progression by restaging CT 08-15-14.  Will begin doxil every 3-4 weeks as long as echocardiogram ok, with  avastin, this chosen in preference to ifosfamide as noted above. Genetics testing anticipated and [employer] [facility] requested. 2. 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  7.flu vaccine given  8. 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   I will see her back ~ 2 weeks after first doxil. She knows to call in interim if needed. Time spent 45 min including >50% counseling and coordination of care Cc this note Drs Alycia Rossetti, [place]ie [place]  LIVESAY,LENNIS P, MD   09/09/2014, 4:06 PM

## 2014-09-11 ENCOUNTER — Other Ambulatory Visit: Payer: Self-pay | Admitting: Oncology

## 2014-09-11 DIAGNOSIS — C569 Malignant neoplasm of unspecified ovary: Secondary | ICD-10-CM

## 2014-09-12 ENCOUNTER — Telehealth: Payer: Self-pay | Admitting: Oncology

## 2014-09-12 ENCOUNTER — Other Ambulatory Visit (HOSPITAL_BASED_OUTPATIENT_CLINIC_OR_DEPARTMENT_OTHER): Payer: Medicare Other

## 2014-09-12 ENCOUNTER — Encounter: Payer: Self-pay | Admitting: Genetic Counselor

## 2014-09-12 ENCOUNTER — Telehealth: Payer: Self-pay | Admitting: *Deleted

## 2014-09-12 ENCOUNTER — Ambulatory Visit (HOSPITAL_BASED_OUTPATIENT_CLINIC_OR_DEPARTMENT_OTHER): Payer: Self-pay | Admitting: Genetic Counselor

## 2014-09-12 DIAGNOSIS — Z800 Family history of malignant neoplasm of digestive organs: Secondary | ICD-10-CM

## 2014-09-12 DIAGNOSIS — Z315 Encounter for genetic counseling: Secondary | ICD-10-CM

## 2014-09-12 DIAGNOSIS — C569 Malignant neoplasm of unspecified ovary: Secondary | ICD-10-CM

## 2014-09-12 LAB — COMPREHENSIVE METABOLIC PANEL (CC13)
ALT: 16 U/L (ref 0–55)
ANION GAP: 8 meq/L (ref 3–11)
AST: 22 U/L (ref 5–34)
Albumin: 4 g/dL (ref 3.5–5.0)
Alkaline Phosphatase: 51 U/L (ref 40–150)
BUN: 19 mg/dL (ref 7.0–26.0)
CALCIUM: 9.8 mg/dL (ref 8.4–10.4)
CO2: 29 meq/L (ref 22–29)
CREATININE: 0.9 mg/dL (ref 0.6–1.1)
Chloride: 106 mEq/L (ref 98–109)
Glucose: 86 mg/dl (ref 70–140)
Potassium: 4.2 mEq/L (ref 3.5–5.1)
Sodium: 143 mEq/L (ref 136–145)
Total Bilirubin: 0.43 mg/dL (ref 0.20–1.20)
Total Protein: 7.4 g/dL (ref 6.4–8.3)

## 2014-09-12 NOTE — Telephone Encounter (Signed)
Per staff message and POF I have scheduled appts. Advised scheduler of appts. JMW  

## 2014-09-12 NOTE — Telephone Encounter (Signed)
per Barbaraann Share to CX 12/7 appt-will mail pt copy of new sch

## 2014-09-12 NOTE — Progress Notes (Signed)
Dr. Norberta Keens requested a consultation for genetic counseling and risk assessment for [name], a 68 y.o. female, for discussion of her personal history of ovarian carcinosarcoma.  She presents to clinic today to discuss the possibility of a genetic predisposition to cancer, and to further clarify her risks, as well as her family members' risks for cancer.   HISTORY OF PRESENT ILLNESS: In April 2015, at the age of 41, Meredith CANGE was diagnosed with carcinosarcoma of the ovary. This was treated with TAH/BSO and chemotherapy.  She has learned that she is not responding as hoped to the chemo and has an opportunity to participate in treatment at [facility] for individuals who have a hereditary cancer syndrome.  She is unclear which syndrome this could be.   Past Medical History  Diagnosis Date  . Thyroid disease   . Hypertension   . Anemia   . Asthma   . Hiatal hernia   . Cancer 02/2014    ovarian ca  . Renal disorder     Past Surgical History  Procedure Laterality Date  . Tonsillectomy    . Abdominal hysterectomy    . Thyroidectomy, partial    . Cholecystectomy N/A 11/22/2013    Procedure: LAPAROSCOPIC CHOLECYSTECTOMY WITH INTRAOPERATIVE CHOLANGIOGRAM;  Surgeon: Gwenyth Ober, MD;  Location: [facility];  Service: General;  Laterality: N/A;  . Colostomy      History   Social History  . Marital Status: Widowed    Spouse Name: N/A    Number of Children: 2  . Years of Education: N/A   Social History Main Topics  . Smoking status: Never Smoker   . Smokeless tobacco: Never Used  . Alcohol Use: No  . Drug Use: No  . Sexual Activity: Not Currently   Other Topics Concern  . None   Social History Narrative    REPRODUCTIVE HISTORY AND PERSONAL RISK ASSESSMENT FACTORS: Menarche was at age 84.   postmenopausal Uterus Intact: no Ovaries Intact: no G2P2A0, first live birth at age 36  She has not previously undergone treatment for infertility.   Oral Contraceptive  use: 0 years   She has used HRT in the past.    FAMILY HISTORY:  We obtained a detailed, 4-generation family history.  Significant diagnoses are listed below: Family History  Problem Relation Age of Onset  . Lung cancer Father 5  . Polycythemia Mother   . Leukemia Maternal Aunt   . Colon cancer Paternal Uncle   . Multiple myeloma Cousin     maternal first cousin  . Colon cancer Cousin     paternal first cousin dx in her 75s    Patient's maternal ancestors are of [place] descent, and paternal ancestors are of [place] and [place] descent. There is no reported Ashkenazi Jewish ancestry. There is no known consanguinity.  GENETIC COUNSELING ASSESSMENT: [name] is a 68 y.o. female with a personal history of ovarian cancer and family history of colon cancer which somewhat suggestive of a Lynch syndrome or other hereditary cancer syndrome and predisposition to cancer. We, therefore, discussed and recommended the following at today's visit.   DISCUSSION: We reviewed the characteristics, features and inheritance patterns of hereditary cancer syndromes. We also discussed genetic testing, including the appropriate family members to test, the process of testing, insurance coverage and turn-around-time for results. We reviewed that about 12% of ovarian cancer is the result of a hereditary cancer syndrome.  Most commonly BRCA mutations.  However, Lynch syndrome  is also associated with ovarian cancer, and based on the family history of colon cancer in her family is more suggestive then BRCA.  We discussed genetic testing and hereditary cancer genes that can be associated with an increased risk. She is concerned not only about herself, but also her daughter and granddaughters.   PLAN: After considering the risks, benefits, and limitations, [name] provided informed consent to pursue genetic testing and the blood sample will be sent to [facility] for analysis of the OvaNext  Panel testing. We discussed the implications of a positive, negative and/ or variant of uncertain significance genetic test result. Results should be available within approximately 2-4 weeks' time, at which point they will be disclosed by telephone to Minus Liberty, as will any additional recommendations warranted by these results. [name] will receive a summary of her genetic counseling visit and a copy of her results once available. This information will also be available in Epic. We encouraged [name] to remain in contact with cancer genetics annually so that we can continuously update the family history and inform her of any changes in cancer genetics and testing that may be of benefit for her family. [name]'s questions were answered to her satisfaction today. Our contact information was provided should additional questions or concerns arise.  The patient was seen for a total of 60 minutes, greater than 50% of which was spent face-to-face counseling.  This note will also be sent to the referring provider via the electronic medical record. The patient will be supplied with a summary of this genetic counseling discussion as well as educational information on the discussed hereditary cancer syndromes following the conclusion of their visit.   Patient was discussed with Dr. Marcy Panning.   _______________________________________________________________________ For Office Staff:  Number of people involved in session: 1 Was an Intern/ student involved with case: no

## 2014-09-12 NOTE — Telephone Encounter (Signed)
Left Louise vm to clarify appts for 12/3 or 12/7-will cotrrect after response

## 2014-09-12 NOTE — Telephone Encounter (Signed)
per pof to sch pt appt-sch appt-sent MW email to sch trmt-sent linda email to sch ECHO-pt coming late 11/16 and will give updated sch @ that time

## 2014-09-14 ENCOUNTER — Telehealth: Payer: Self-pay | Admitting: Oncology

## 2014-09-14 NOTE — Telephone Encounter (Signed)
per pof to sch pt ECHO-cld & spoke to pt gave pt time & date-pt wanted to spk w/Loise trans to [facility]

## 2014-09-15 ENCOUNTER — Telehealth: Payer: Self-pay | Admitting: Oncology

## 2014-09-15 ENCOUNTER — Other Ambulatory Visit: Payer: Self-pay | Admitting: Oncology

## 2014-09-15 NOTE — Telephone Encounter (Signed)
PER 11/19 POF CXD 11/20 LB/TX AND MOVED TO 11/24 DUEDELAY IN GETTING ECHO SCHEDULED. S/W PT SHE IS AWARE. CONFIRMED NEW D/T FOR LB/TX 11/24 @ 9:45AM AND ECHO APPT AT [facility] TOMORROW 11/20 @ 3PM. OTHER APPTS REMAIN AS IS AND PT WILL GET NEW SCHEDULE 11/24.

## 2014-09-15 NOTE — Telephone Encounter (Signed)
Medical Oncology  This MD notified by [facility] pharmacist that echocardiogram has not been done, with first doxil (+avastin) scheduled for 09-16-14. Echo order and my note clearly state that this is needed prior to chemo. I spoke with [facility] infusion: could do treatment on Tues 11-24. I called echo scheduling at [facility]: opening tomorrow 11-20 at [facility]. Patient to arrive 2:45 thru Entrance A at [facility], go to admissions to be registered and admissions staff will escort her to echo lab. Results should be available in <24 hours. I called patient at home and explained that we did not get echo scheduled as we needed. She is in agreement with echo tomorrow as above, and first doxil + avastin on 11-24. Message sent to [facility] pharmacist that she will not be treated 11-20. Message left for chemo scheduler re moving treatment to 11-24.  Patient had other questions re skin care, including avoiding hot water and concerns about appliance adhesive for ostomy. She uses spray to remove appliance more easily, which we can refill if needed. She usually changes the appliance every other day, so will do this just prior to doxil and wait as long as possible before removing the adhesive after treatment. She is having some pain on side of kidney stones, has needed occasional dilaudid left from surgery. She has not seen hematuria. She will pick up prescription for dilaudid from our office on 11-20 before going to [facility] echo lab. Message to RN for script.  Patient very gracious about the changes in schedule, and I have thanked her for understanding.  L.Mahogany Torrance,MD.

## 2014-09-16 ENCOUNTER — Other Ambulatory Visit: Payer: Medicare Other

## 2014-09-16 ENCOUNTER — Other Ambulatory Visit: Payer: Self-pay | Admitting: Oncology

## 2014-09-16 ENCOUNTER — Ambulatory Visit (HOSPITAL_COMMUNITY)
Admission: RE | Admit: 2014-09-16 | Discharge: 2014-09-16 | Disposition: A | Discharge status: Home / Self Care | Payer: Medicare Other | Source: Ambulatory Visit | Attending: Oncology | Admitting: Oncology

## 2014-09-16 ENCOUNTER — Other Ambulatory Visit: Payer: Self-pay

## 2014-09-16 ENCOUNTER — Ambulatory Visit: Payer: Medicare Other

## 2014-09-16 DIAGNOSIS — Z01818 Encounter for other preprocedural examination: Secondary | ICD-10-CM | POA: Diagnosis not present

## 2014-09-16 DIAGNOSIS — I10 Essential (primary) hypertension: Secondary | ICD-10-CM | POA: Insufficient documentation

## 2014-09-16 DIAGNOSIS — I359 Nonrheumatic aortic valve disorder, unspecified: Secondary | ICD-10-CM

## 2014-09-16 DIAGNOSIS — C561 Malignant neoplasm of right ovary: Secondary | ICD-10-CM

## 2014-09-16 DIAGNOSIS — C569 Malignant neoplasm of unspecified ovary: Secondary | ICD-10-CM

## 2014-09-16 MED ORDER — HYDROMORPHONE HCL 2 MG PO TABS: ORAL_TABLET | ORAL | Status: DC

## 2014-09-16 NOTE — Progress Notes (Signed)
Echocardiogram 2D Echocardiogram has been performed.  Joelene Millin 09/16/2014, 3:08 PM

## 2014-09-19 ENCOUNTER — Encounter: Payer: Self-pay | Admitting: *Deleted

## 2014-09-19 ENCOUNTER — Telehealth: Payer: Self-pay

## 2014-09-19 ENCOUNTER — Encounter: Payer: Self-pay | Admitting: Oncology

## 2014-09-19 NOTE — Progress Notes (Signed)
Medical Oncology  Copy of report from Dr Louis Meckel re renal stone analysis from 08-30-14: uric acid 80%, calcium oxalate 20% "informed her that her stone is most likely a result of chemotherapy. To prevent this she needs to stay hydrated"  Report sent to HIM to scan into this EMR.  Godfrey Pick, MD

## 2014-09-19 NOTE — Telephone Encounter (Signed)
-----   Message from Gordy Levan, MD sent at 09/16/2014  9:36 PM EST ----- Labs seen and need follow up: please let her know echocardiogram fine for chemo as planned 11-24.

## 2014-09-19 NOTE — Telephone Encounter (Signed)
Told [name] the results of the Echocardiogram as noted below by Dr. Marko Plume.  Ms. Blodgett verbalized understanding.

## 2014-09-20 ENCOUNTER — Ambulatory Visit (HOSPITAL_BASED_OUTPATIENT_CLINIC_OR_DEPARTMENT_OTHER): Payer: Medicare Other

## 2014-09-20 ENCOUNTER — Other Ambulatory Visit (HOSPITAL_BASED_OUTPATIENT_CLINIC_OR_DEPARTMENT_OTHER): Payer: Medicare Other

## 2014-09-20 DIAGNOSIS — C569 Malignant neoplasm of unspecified ovary: Secondary | ICD-10-CM

## 2014-09-20 DIAGNOSIS — Z5112 Encounter for antineoplastic immunotherapy: Secondary | ICD-10-CM

## 2014-09-20 DIAGNOSIS — Z5111 Encounter for antineoplastic chemotherapy: Secondary | ICD-10-CM

## 2014-09-20 LAB — CBC WITH DIFFERENTIAL/PLATELET
BASO%: 0.8 % (ref 0.0–2.0)
Basophils Absolute: 0 10*3/uL (ref 0.0–0.1)
EOS ABS: 0.1 10*3/uL (ref 0.0–0.5)
EOS%: 2.1 % (ref 0.0–7.0)
HCT: 32 % — ABNORMAL LOW (ref 34.8–46.6)
HEMOGLOBIN: 10.5 g/dL — AB (ref 11.6–15.9)
LYMPH#: 0.6 10*3/uL — AB (ref 0.9–3.3)
LYMPH%: 12.4 % — ABNORMAL LOW (ref 14.0–49.7)
MCH: 30.6 pg (ref 25.1–34.0)
MCHC: 33 g/dL (ref 31.5–36.0)
MCV: 92.9 fL (ref 79.5–101.0)
MONO#: 0.4 10*3/uL (ref 0.1–0.9)
MONO%: 7.7 % (ref 0.0–14.0)
NEUT%: 77 % — ABNORMAL HIGH (ref 38.4–76.8)
NEUTROS ABS: 3.9 10*3/uL (ref 1.5–6.5)
Platelets: 246 10*3/uL (ref 145–400)
RBC: 3.45 10*6/uL — ABNORMAL LOW (ref 3.70–5.45)
RDW: 14 % (ref 11.2–14.5)
WBC: 5.1 10*3/uL (ref 3.9–10.3)

## 2014-09-20 LAB — COMPREHENSIVE METABOLIC PANEL (CC13)
ALBUMIN: 3.7 g/dL (ref 3.5–5.0)
ALT: 12 U/L (ref 0–55)
AST: 20 U/L (ref 5–34)
Alkaline Phosphatase: 57 U/L (ref 40–150)
Anion Gap: 9 mEq/L (ref 3–11)
BUN: 20.7 mg/dL (ref 7.0–26.0)
CALCIUM: 9.9 mg/dL (ref 8.4–10.4)
CHLORIDE: 106 meq/L (ref 98–109)
CO2: 29 mEq/L (ref 22–29)
Creatinine: 1 mg/dL (ref 0.6–1.1)
Glucose: 153 mg/dl — ABNORMAL HIGH (ref 70–140)
POTASSIUM: 3.8 meq/L (ref 3.5–5.1)
Sodium: 144 mEq/L (ref 136–145)
TOTAL PROTEIN: 7.2 g/dL (ref 6.4–8.3)
Total Bilirubin: 0.24 mg/dL (ref 0.20–1.20)

## 2014-09-20 LAB — UA PROTEIN, DIPSTICK - CHCC: Protein, ur: NEGATIVE mg/dL

## 2014-09-20 MED ORDER — SODIUM CHLORIDE 0.9 % IV SOLN
Freq: Once | INTRAVENOUS | Status: AC
  Administered 2014-09-20: 11:00:00 via INTRAVENOUS

## 2014-09-20 MED ORDER — HEPARIN SOD (PORK) LOCK FLUSH 100 UNIT/ML IV SOLN
500.0000 [IU] | Freq: Once | INTRAVENOUS | Status: AC | PRN
  Administered 2014-09-20: 500 [IU]
  Filled 2014-09-20: qty 5

## 2014-09-20 MED ORDER — ONDANSETRON 8 MG/50ML IVPB (CHCC)
8.0000 mg | Freq: Once | INTRAVENOUS | Status: AC
  Administered 2014-09-20: 8 mg via INTRAVENOUS

## 2014-09-20 MED ORDER — DEXAMETHASONE SODIUM PHOSPHATE 10 MG/ML IJ SOLN
10.0000 mg | Freq: Once | INTRAMUSCULAR | Status: AC
  Administered 2014-09-20: 10 mg via INTRAVENOUS

## 2014-09-20 MED ORDER — SODIUM CHLORIDE 0.9 % IV SOLN: Freq: Once | INTRAVENOUS | Status: AC

## 2014-09-20 MED ORDER — SODIUM CHLORIDE 0.9 % IV SOLN
15.0000 mg/kg | Freq: Once | INTRAVENOUS | Status: AC
  Administered 2014-09-20: 1250 mg via INTRAVENOUS
  Filled 2014-09-20: qty 50

## 2014-09-20 MED ORDER — ONDANSETRON 8 MG/NS 50 ML IVPB
INTRAVENOUS | Status: AC
  Filled 2014-09-20: qty 8

## 2014-09-20 MED ORDER — SODIUM CHLORIDE 0.9 % IJ SOLN
10.0000 mL | INTRAMUSCULAR | Status: DC | PRN
  Administered 2014-09-20: 10 mL
  Filled 2014-09-20: qty 10

## 2014-09-20 MED ORDER — DOXORUBICIN HCL LIPOSOMAL CHEMO INJECTION 2 MG/ML
40.0000 mg/m2 | Freq: Once | INTRAVENOUS | Status: AC
  Administered 2014-09-20: 80 mg via INTRAVENOUS
  Filled 2014-09-20: qty 40

## 2014-09-20 MED ORDER — DEXAMETHASONE SODIUM PHOSPHATE 10 MG/ML IJ SOLN
INTRAMUSCULAR | Status: AC
  Filled 2014-09-20: qty 1

## 2014-09-20 NOTE — Patient Instructions (Signed)
Bevacizumab injection What is this medicine? BEVACIZUMAB (be va SIZ yoo mab) is a chemotherapy drug. It targets a protein found in many cancer cell types, and halts cancer growth. This drug treats many cancers including non-small cell lung cancer, ovarian cancer, cervical cancer, and colon or rectal cancer. It is usually given with other chemotherapy drugs. This medicine may be used for other purposes; ask your health care provider or pharmacist if you have questions. COMMON BRAND NAME(S): Avastin What should I tell my health care provider before I take this medicine? They need to know if you have any of these conditions: -blood clots -heart disease, including heart failure, heart attack, or chest pain (angina) -high blood pressure -infection (especially a virus infection such as chickenpox, cold sores, or herpes) -kidney disease -lung disease -prior chemotherapy with doxorubicin, daunorubicin, epirubicin, or other anthracycline type chemotherapy agents -recent or ongoing radiation therapy -recent surgery -stroke -an unusual or allergic reaction to bevacizumab, hamster proteins, mouse proteins, other medicines, foods, dyes, or preservatives -pregnant or trying to get pregnant -breast-feeding How should I use this medicine? This medicine is for infusion into a vein. It is given by a health care professional in a hospital or clinic setting. Talk to your pediatrician regarding the use of this medicine in children. Special care may be needed. Overdosage: If you think you have taken too much of this medicine contact a poison control center or emergency room at once. NOTE: This medicine is only for you. Do not share this medicine with others. What if I miss a dose? It is important not to miss your dose. Call your doctor or health care professional if you are unable to keep an appointment. What may interact with this medicine? Interactions are not expected. This list may not describe all  possible interactions. Give your health care provider a list of all the medicines, herbs, non-prescription drugs, or dietary supplements you use. Also tell them if you smoke, drink alcohol, or use illegal drugs. Some items may interact with your medicine. What should I watch for while using this medicine? Your condition will be monitored carefully while you are receiving this medicine. You will need important blood work and urine testing done while you are taking this medicine. During your treatment, let your health care professional know if you have any unusual symptoms, such as difficulty breathing. This medicine may rarely cause 'gastrointestinal perforation' (holes in the stomach, intestines or colon), a serious side effect requiring surgery to repair. This medicine should be started at least 28 days following major surgery and the site of the surgery should be totally healed. Check with your doctor before scheduling dental work or surgery while you are receiving this treatment. Talk to your doctor if you have recently had surgery or if you have a wound that has not healed. Do not become pregnant while taking this medicine. Women should inform their doctor if they wish to become pregnant or think they might be pregnant. There is a potential for serious side effects to an unborn child. Talk to your health care professional or pharmacist for more information. Do not breast-feed an infant while taking this medicine. This medicine has caused ovarian failure in some women. This medicine may interfere with the ability to have a child. You should talk to your doctor or health care professional if you are concerned about your fertility. What side effects may I notice from receiving this medicine? Side effects that you should report to your doctor or health care professional  as soon as possible: -allergic reactions like skin rash, itching or hives, swelling of the face, lips, or tongue -signs of infection -  fever or chills, cough, sore throat, pain or trouble passing urine -signs of decreased platelets or bleeding - bruising, pinpoint red spots on the skin, black, tarry stools, nosebleeds, blood in the urine -breathing problems -changes in vision -chest pain -confusion -jaw pain, especially after dental work -mouth sores -seizures -severe abdominal pain -severe headache -sudden numbness or weakness of the face, arm or leg -swelling of legs or ankles -symptoms of a stroke: change in mental awareness, inability to talk or move one side of the body (especially in patients with lung cancer) -trouble passing urine or change in the amount of urine -trouble speaking or understanding -trouble walking, dizziness, loss of balance or coordination Side effects that usually do not require medical attention (report to your doctor or health care professional if they continue or are bothersome): -constipation -diarrhea -dry skin -headache -loss of appetite -nausea, vomiting This list may not describe all possible side effects. Call your doctor for medical advice about side effects. You may report side effects to FDA at 1-800-FDA-1088. Where should I keep my medicine? This drug is given in a hospital or clinic and will not be stored at home. NOTE: This sheet is a summary. It may not cover all possible information. If you have questions about this medicine, talk to your doctor, pharmacist, or health care provider.  2015, Elsevier/Gold Standard. (2013-09-14 11:38:34) Doxorubicin Liposomal injection What is this medicine? LIPOSOMAL DOXORUBICIN (LIP oh som al dox oh ROO bi sin) is a chemotherapy drug. This medicine is used to treat many kinds of cancer like Kaposi's sarcoma, multiple myeloma, and ovarian cancer. This medicine may be used for other purposes; ask your health care provider or pharmacist if you have questions. COMMON BRAND NAME(S): Doxil, Lipodox What should I tell my health care provider  before I take this medicine? They need to know if you have any of these conditions: -blood disorders -heart disease -infection (especially a virus infection such as chickenpox, cold sores, or herpes) -liver disease -recent or ongoing radiation therapy -an unusual or allergic reaction to doxorubicin, other chemotherapy agents, soybeans, other medicines, foods, dyes, or preservatives -pregnant or trying to get pregnant -breast-feeding How should I use this medicine? This drug is given as an infusion into a vein. It is administered in a hospital or clinic by a specially trained health care professional. If you have pain, swelling, burning or any unusual feeling around the site of your injection, tell your health care professional right away. Talk to your pediatrician regarding the use of this medicine in children. Special care may be needed. Overdosage: If you think you have taken too much of this medicine contact a poison control center or emergency room at once. NOTE: This medicine is only for you. Do not share this medicine with others. What if I miss a dose? It is important not to miss your dose. Call your doctor or health care professional if you are unable to keep an appointment. What may interact with this medicine? Do not take this medicine with any of the following medications: -zidovudine This medicine may also interact with the following medications: -medicines to increase blood counts like filgrastim, pegfilgrastim, sargramostim -vaccines Talk to your doctor or health care professional before taking any of these medicines: -acetaminophen -aspirin -ibuprofen -ketoprofen -naproxen This list may not describe all possible interactions. Give your health care provider a list  of all the medicines, herbs, non-prescription drugs, or dietary supplements you use. Also tell them if you smoke, drink alcohol, or use illegal drugs. Some items may interact with your medicine. What should I  watch for while using this medicine? Your condition will be monitored carefully while you are receiving this medicine. You will need important blood work done while you are taking this medicine. This drug may make you feel generally unwell. This is not uncommon, as chemotherapy can affect healthy cells as well as cancer cells. Report any side effects. Continue your course of treatment even though you feel ill unless your doctor tells you to stop. Your urine may turn orange-red for a few days after your dose. This is not blood. If your urine is dark or brown, call your doctor. In some cases, you may be given additional medicines to help with side effects. Follow all directions for their use. Call your doctor or health care professional for advice if you get a fever (100.5 degrees F or higher), chills or sore throat, or other symptoms of a cold or flu. Do not treat yourself. This drug decreases your body's ability to fight infections. Try to avoid being around people who are sick. This medicine may increase your risk to bruise or bleed. Call your doctor or health care professional if you notice any unusual bleeding. Be careful brushing and flossing your teeth or using a toothpick because you may get an infection or bleed more easily. If you have any dental work done, tell your dentist you are receiving this medicine. Avoid taking products that contain aspirin, acetaminophen, ibuprofen, naproxen, or ketoprofen unless instructed by your doctor. These medicines may hide a fever. Men and women of childbearing age should use effective birth control methods while using taking this medicine. Do not become pregnant while taking this medicine. There is a potential for serious side effects to an unborn child. Talk to your health care professional or pharmacist for more information. Do not breast-feed an infant while taking this medicine. Talk to your doctor about your risk of cancer. You may be more at risk for  certain types of cancers if you take this medicine. What side effects may I notice from receiving this medicine? Side effects that you should report to your doctor or health care professional as soon as possible: -allergic reactions like skin rash, itching or hives, swelling of the face, lips, or tongue -low blood counts - this medicine may decrease the number of white blood cells, red blood cells and platelets. You may be at increased risk for infections and bleeding. -signs of hand-foot syndrome - tingling or burning, redness, flaking, swelling, small blisters, or small sores on the palms of your hands or the soles of your feet -signs of infection - fever or chills, cough, sore throat, pain or difficulty passing urine -signs of decreased platelets or bleeding - bruising, pinpoint red spots on the skin, black, tarry stools, blood in the urine -signs of decreased red blood cells - unusually weak or tired, fainting spells, lightheadedness -back pain, chills, facial flushing, fever, headache, tightness in the chest or throat during the infusion -breathing problems -chest pain -fast, irregular heartbeat -mouth pain, redness, sores -pain, swelling, redness at site where injected -pain, tingling, numbness in the hands or feet -swelling of ankles, feet, or hands -vomiting Side effects that usually do not require medical attention (report to your doctor or health care professional if they continue or are bothersome): -diarrhea -hair loss -loss of appetite -  nail discoloration or damage -nausea -red or watery eyes -red colored urine -stomach upset This list may not describe all possible side effects. Call your doctor for medical advice about side effects. You may report side effects to FDA at 1-800-FDA-1088. Where should I keep my medicine? This drug is given in a hospital or clinic and will not be stored at home. NOTE: This sheet is a summary. It may not cover all possible information. If you  have questions about this medicine, talk to your doctor, pharmacist, or health care provider.  2015, Elsevier/Gold Standard. (2012-07-03 10:12:56)

## 2014-09-24 ENCOUNTER — Other Ambulatory Visit: Payer: Self-pay | Admitting: Oncology

## 2014-09-24 DIAGNOSIS — C569 Malignant neoplasm of unspecified ovary: Secondary | ICD-10-CM

## 2014-09-27 ENCOUNTER — Other Ambulatory Visit (HOSPITAL_COMMUNITY): Payer: Medicare Other

## 2014-09-29 ENCOUNTER — Ambulatory Visit (HOSPITAL_BASED_OUTPATIENT_CLINIC_OR_DEPARTMENT_OTHER): Payer: Medicare Other

## 2014-09-29 ENCOUNTER — Other Ambulatory Visit (HOSPITAL_BASED_OUTPATIENT_CLINIC_OR_DEPARTMENT_OTHER): Payer: Medicare Other

## 2014-09-29 ENCOUNTER — Telehealth: Payer: Self-pay | Admitting: Oncology

## 2014-09-29 ENCOUNTER — Other Ambulatory Visit: Payer: Medicare Other

## 2014-09-29 ENCOUNTER — Encounter: Payer: Self-pay | Admitting: Oncology

## 2014-09-29 ENCOUNTER — Ambulatory Visit (HOSPITAL_BASED_OUTPATIENT_CLINIC_OR_DEPARTMENT_OTHER): Payer: Medicare Other | Admitting: Oncology

## 2014-09-29 VITALS — BP 135/74 | HR 75 | Temp 99.2°F | Resp 18 | Ht 67.0 in | Wt 181.2 lb

## 2014-09-29 DIAGNOSIS — Z95828 Presence of other vascular implants and grafts: Secondary | ICD-10-CM

## 2014-09-29 DIAGNOSIS — E039 Hypothyroidism, unspecified: Secondary | ICD-10-CM

## 2014-09-29 DIAGNOSIS — G620 Drug-induced polyneuropathy: Secondary | ICD-10-CM

## 2014-09-29 DIAGNOSIS — N289 Disorder of kidney and ureter, unspecified: Secondary | ICD-10-CM

## 2014-09-29 DIAGNOSIS — C569 Malignant neoplasm of unspecified ovary: Secondary | ICD-10-CM

## 2014-09-29 DIAGNOSIS — C561 Malignant neoplasm of right ovary: Secondary | ICD-10-CM

## 2014-09-29 LAB — COMPREHENSIVE METABOLIC PANEL (CC13)
ALBUMIN: 3.7 g/dL (ref 3.5–5.0)
ALK PHOS: 63 U/L (ref 40–150)
ALT: 14 U/L (ref 0–55)
AST: 22 U/L (ref 5–34)
Anion Gap: 11 mEq/L (ref 3–11)
BUN: 24.5 mg/dL (ref 7.0–26.0)
CO2: 26 mEq/L (ref 22–29)
Calcium: 9.9 mg/dL (ref 8.4–10.4)
Chloride: 104 mEq/L (ref 98–109)
Creatinine: 0.9 mg/dL (ref 0.6–1.1)
EGFR: 63 mL/min/{1.73_m2} — AB (ref >90)
GLUCOSE: 98 mg/dL (ref 70–140)
POTASSIUM: 3.8 meq/L (ref 3.5–5.1)
Sodium: 141 mEq/L (ref 136–145)
TOTAL PROTEIN: 7.4 g/dL (ref 6.4–8.3)
Total Bilirubin: 0.39 mg/dL (ref 0.20–1.20)

## 2014-09-29 LAB — CBC WITH DIFFERENTIAL/PLATELET
BASO%: 0.5 % (ref 0.0–2.0)
Basophils Absolute: 0 10*3/uL (ref 0.0–0.1)
EOS ABS: 0 10*3/uL (ref 0.0–0.5)
EOS%: 0.6 % (ref 0.0–7.0)
HCT: 31.1 % — ABNORMAL LOW (ref 34.8–46.6)
HGB: 10.3 g/dL — ABNORMAL LOW (ref 11.6–15.9)
LYMPH%: 7.8 % — ABNORMAL LOW (ref 14.0–49.7)
MCH: 30.2 pg (ref 25.1–34.0)
MCHC: 33 g/dL (ref 31.5–36.0)
MCV: 91.6 fL (ref 79.5–101.0)
MONO#: 0.2 10*3/uL (ref 0.1–0.9)
MONO%: 2.8 % (ref 0.0–14.0)
NEUT%: 88.3 % — ABNORMAL HIGH (ref 38.4–76.8)
NEUTROS ABS: 5.4 10*3/uL (ref 1.5–6.5)
Platelets: 278 10*3/uL (ref 145–400)
RBC: 3.4 10*6/uL — AB (ref 3.70–5.45)
RDW: 13.8 % (ref 11.2–14.5)
WBC: 6.1 10*3/uL (ref 3.9–10.3)
lymph#: 0.5 10*3/uL — ABNORMAL LOW (ref 0.9–3.3)

## 2014-09-29 MED ORDER — SODIUM CHLORIDE 0.9 % IJ SOLN
10.0000 mL | INTRAMUSCULAR | Status: DC | PRN
  Administered 2014-09-29: 10 mL via INTRAVENOUS
  Filled 2014-09-29: qty 10

## 2014-09-29 MED ORDER — HEPARIN SOD (PORK) LOCK FLUSH 100 UNIT/ML IV SOLN
500.0000 [IU] | Freq: Once | INTRAVENOUS | Status: AC
  Administered 2014-09-29: 500 [IU] via INTRAVENOUS
  Filled 2014-09-29: qty 5

## 2014-09-29 MED ORDER — HYDROMORPHONE HCL 2 MG PO TABS: ORAL_TABLET | ORAL | Status: DC

## 2014-09-29 NOTE — Progress Notes (Signed)
OFFICE PROGRESS NOTE   09/29/2014   Physicians:Paola Alycia Rossetti, Gareth Eagle (PCP), Laurence Spates, Judeth Horn, Ron Davis/ Louis Meckel  INTERVAL HISTORY:  Patient is seen, alone for visit, in follow up of first doxil for progressive uterine carcinosarcoma, this given on 09-20-14 with avastin. She has tolerated doxil well thus far, with difficulty during first infusion and no skin reaction.   She has been somewhat more fatigued and appetite is not great, tho she continues to eat. She uses dilaudid a few times daily for RUQ discomfort, with improvement. She is still passing tiny particles apparent stones with urine, is "drinking lemonade and water all day"; she does not have another appointment scheduled back to Dr Louis Meckel, but is glad to see him again. She has had minimal nausea. Bowels are moving well via colostomy. She had minimal epistaxis from left nares, no other bleeding. She denies SOB or other respiratory symptoms.    PAC in Flu vaccine done Genetics testing sent 09-12-14  She prefers treatments on Fridays due to daughter's schedule.   ONCOLOGIC HISTORY Patient had been in usual generally good health, status post remote hysterectomy, until abdominal pain with radiation to right shoulder in Jan 2015. She was admitted thru ED 11-21-13 with [place] evidence of cholelithiasis, tho no signs of cholecystitis then. (Note patient tells me that she had CT in Jan, however this EMR has only abdominal US then). She was taken to laparoscopic cholecystectomy by Dr Hulen Skains on 11-22-13, with multiple large stones in specimen and benign pathology. Her post operative course was not remarkable and the pain initially resolved entirely, with patient feeling well at post op visit in mid Feb. By ~March she had more abdominal cramping and dull pain, with abdominal distension and was treated with cipro for apparent UTI. She was seen in ED on 02-03-14 with concern for recurrent UTI, with abdominal symptoms as above +  intermittent nausea and vomiting, with abdomen tender on exam then. CT AP found large heterogeneous mass lower mid abdomen/ pelvis 19.4 x 13.6 x 14.3 cm with soft tissue along pericolic gutters and left anterior abdomen, with mild bilateral hydronephrosis and small right pleural effusion. CA 125 resulted at 67.9 and CEA was 1.0. She saw Dr Alycia Rossetti on 02-09-14 and was hospitalized at [facility] from 02-11-14 thru 02-25-14. She went to exploratory laparotomy on 02-15-14, with findings of 20 cm mass filling pelvis and involving rectosigmoid and bilateral ovaries and tubes, with extension of tumor deep in pelvis and along rectum. Surgery was BSO/omentectomy and rectosigmoid resection with reanastomosis was performed, then diverting loop colostomy due to intraoperative concerns about the proximal rectal stump. She had bilateral ureterolysis for retroperitoneal fibrosis. At completion of procedure, which was optimal debulking, there were scattered <1 cm nodules on peritoneum, small bowel and small bowel mesentery, with no appreciable upper abdominal disease. She was transfused 2 units PRBCs intraoperatively. [facility] [phone] from 02-15-2014) found high grade carcinosarcoma 17 cm originating in one ovary which was adherent to multiple structures, metastatic disease in 1.8 cm peritoneal nodule as well as sigmoid colon nodule 1.5 cm and in omentum, no lymph nodes submitted, no identified LVSI, margin of one end of colon positive, The colon specimen also had adenomatous polyp without high grade dysplasia.  She was hospitalized at [facility] from 02-11-14 thru 02-25-14. Preoperatively she had acute kidney injury, with increase in creatinine from baseline 0.7 to high of 2.17; creatinine postoperatively was 1.52. She needed NG following surgery.  PAC was placed at [facility] on 02-21-14.  Recommendations from [facility] multidisciplinary conference 02-23-2014 was for chemotherapy with platinum and taxane. She had cycle 1 taxol carboplatin on 8-56-31,  complicated by severe taxol aches and by neutropenia day 11, gCSF support added. She has needed IVF/ IV antiemetics on ~ day 4 additionally.  CT CAP 05-23-14 showed peritoneal involvement such that avastin was added to chemotherapy beginning cycle 4. She completed 6 cycles of carbo taxol 07-15-14. CT AP 08-15-14 showed further progression along liver, presacral and deposits in pelvis. Treatment changed to doxil 11-24, continuing avastin.   Review of systems as above, also: She has had no fever or symptoms of infection, no LE swelling. She has some perineal irritation from voiding frequently, no itching. She notices skin nodules on left scalp since hair loss. Remainder of 10 point Review of Systems negative.  Objective:  Vital signs in last 24 hours:  BP 135/74 mmHg  Pulse 75  Temp(Src) 99.2 F (37.3 C) (Oral)  Resp 18  Ht $R'5\' 7"'Zy$  (1.702 m)  Wt 181 lb 3.2 oz (82.192 kg)  BMI 28.37 kg/m2  SpO2 98%  Alert, oriented and appropriate. Ambulatory without difficulty.  Alopecia  HEENT:PERRL, sclerae not icteric. Oral mucosa moist without lesions including area upper left gum where some tenderness previously,  posterior pharynx clear. Nares not remarkable. Neck supple. No JVD.  Lymphatics:no cervical,suraclavicular or inguinal adenopathy Resp: clear to auscultation bilaterally and normal percussion bilaterally Cardio: regular rate and rhythm. No gallop. GI: soft, nontender, not distended, full RUQ without rub. Normally active bowel sounds. Ostomy functioning. Surgical incision not remarkable. Musculoskeletal/ Extremities: without pitting edema, cords, tenderness Neuro: speech fluent, moves all extremities easily, otherwise nonfocal Skin Not dry, no erythema, palms not puffy or erythematous, otherwise without rash, ecchymosis, petechiae. 2 seborrheic keratoses each 0.5 cm diameter left scalp, no irritation or tenderness. Portacath-without erythema or tenderness  Lab Results:  Results for  orders placed or performed in visit on 09/29/14  CBC with Differential  Result Value Ref Range   WBC 6.1 3.9 - 10.3 10e3/uL   NEUT# 5.4 1.5 - 6.5 10e3/uL   HGB 10.3 (L) 11.6 - 15.9 g/dL   HCT 31.1 (L) 34.8 - 46.6 %   Platelets 278 145 - 400 10e3/uL   MCV 91.6 79.5 - 101.0 fL   MCH 30.2 25.1 - 34.0 pg   MCHC 33.0 31.5 - 36.0 g/dL   RBC 3.40 (L) 3.70 - 5.45 10e6/uL   RDW 13.8 11.2 - 14.5 %   lymph# 0.5 (L) 0.9 - 3.3 10e3/uL   MONO# 0.2 0.1 - 0.9 10e3/uL   Eosinophils Absolute 0.0 0.0 - 0.5 10e3/uL   Basophils Absolute 0.0 0.0 - 0.1 10e3/uL   NEUT% 88.3 (H) 38.4 - 76.8 %   LYMPH% 7.8 (L) 14.0 - 49.7 %   MONO% 2.8 0.0 - 14.0 %   EOS% 0.6 0.0 - 7.0 %   BASO% 0.5 0.0 - 2.0 %  Comprehensive metabolic panel (Cmet) - [facility]  Result Value Ref Range   Sodium 141 136 - 145 mEq/L   Potassium 3.8 3.5 - 5.1 mEq/L   Chloride 104 98 - 109 mEq/L   CO2 26 22 - 29 mEq/L   Glucose 98 70 - 140 mg/dl   BUN 24.5 7.0 - 26.0 mg/dL   Creatinine 0.9 0.6 - 1.1 mg/dL   Total Bilirubin 0.39 0.20 - 1.20 mg/dL   Alkaline Phosphatase 63 40 - 150 U/L   AST 22 5 - 34 U/L   ALT 14 0 - 55  U/L   Total Protein 7.4 6.4 - 8.3 g/dL   Albumin 3.7 3.5 - 5.0 g/dL   Calcium 9.9 8.4 - 10.4 mg/dL   Anion Gap 11 3 - 11 mEq/L   EGFR 63 (L) >90 ml/min/1.73 m2    Urine protein negative by dipstick  Studies/Results:  No results found.  Medications: I have reviewed the patient's current medications. Prescription for dilaudid given.  DISCUSSION: patient is pleased with how well she has tolerated first doxil. She will continue prn dilaudid. Will ask Dr Louis Meckel to follow up due to "gravel" sediment in urine.  Genetics testing anticipated and [facility] requested thru [place]. Reviewed skin care while on doxil Handicapped form for DMV done, as she has difficulty walking long distances.  Assessment/Plan: 1.IIIC carcinosarcoma of ovary: post optimal debulking including rectosigmoid resection and diverting loop colostomy at  [facility] 02-15-14. Evidence of peritoneal disease post 3 cycles of taxol carboplatin, with avastin added for next 3 cycles. Progression by restaging CT 08-15-14. Doxil every 3-4 weeks with avastin, this chosen in preference to ifosfamide as previously discussed. #2 doxil with avastin next week. 2. Nephrolithiasis: still passing fine particulate material, will ask Dr Louis Meckel to see again. Pushing po fluids as instructed. 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  7.flu vaccine given  8. 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   All questions answered. Chemo and avastin orders confirmed. I will see her back with labs ~ 2 weeks after next treatment. Time spent 30 min including >50% counseling and coordination of care.   LIVESAY,LENNIS P, MD   09/29/2014, 3:52 PM

## 2014-09-29 NOTE — Patient Instructions (Signed)

## 2014-09-29 NOTE — Telephone Encounter (Signed)
per pof to sch pt appt-gave pt copy of sch °

## 2014-09-30 ENCOUNTER — Telehealth: Payer: Self-pay | Admitting: *Deleted

## 2014-09-30 NOTE — Telephone Encounter (Signed)
Called pt and let her know we scheduled her an appointment to see Dr. Louis Meckel (urologist) on 10/04/14 at 2pm. Pt agreeable to this appt. Told pt that we will fax him Dr. Mariana Kaufman office note so he will have it.

## 2014-09-30 NOTE — Telephone Encounter (Signed)
Per staff message and POF I have scheduled appts. Advised scheduler of appts. JMW  

## 2014-10-03 ENCOUNTER — Other Ambulatory Visit: Payer: Medicare Other

## 2014-10-03 ENCOUNTER — Telehealth: Payer: Self-pay

## 2014-10-03 ENCOUNTER — Ambulatory Visit: Payer: Medicare Other | Admitting: Oncology

## 2014-10-03 NOTE — Telephone Encounter (Signed)
Faxed office note and lab from 10-09-14 visit with Dr. Marko Plume to Dr. Louis Meckel for office visit 10-04-14.

## 2014-10-07 ENCOUNTER — Telehealth: Payer: Self-pay

## 2014-10-07 ENCOUNTER — Ambulatory Visit (HOSPITAL_BASED_OUTPATIENT_CLINIC_OR_DEPARTMENT_OTHER): Payer: Medicare Other

## 2014-10-07 ENCOUNTER — Encounter: Payer: Self-pay | Admitting: Oncology

## 2014-10-07 ENCOUNTER — Encounter: Payer: Self-pay | Admitting: *Deleted

## 2014-10-07 VITALS — BP 129/61 | HR 81 | Temp 98.2°F | Resp 16

## 2014-10-07 DIAGNOSIS — C569 Malignant neoplasm of unspecified ovary: Secondary | ICD-10-CM

## 2014-10-07 DIAGNOSIS — Z5112 Encounter for antineoplastic immunotherapy: Secondary | ICD-10-CM

## 2014-10-07 DIAGNOSIS — C561 Malignant neoplasm of right ovary: Secondary | ICD-10-CM

## 2014-10-07 MED ORDER — PROMETHAZINE HCL 25 MG PO TABS: 12.5000 mg | ORAL_TABLET | Freq: Four times a day (QID) | ORAL | Status: DC | PRN

## 2014-10-07 MED ORDER — SODIUM CHLORIDE 0.9 % IV SOLN
Freq: Once | INTRAVENOUS | Status: AC
  Administered 2014-10-07: 10:00:00 via INTRAVENOUS

## 2014-10-07 MED ORDER — SODIUM CHLORIDE 0.9 % IV SOLN
15.0000 mg/kg | Freq: Once | INTRAVENOUS | Status: AC
  Administered 2014-10-07: 1250 mg via INTRAVENOUS
  Filled 2014-10-07: qty 50

## 2014-10-07 MED ORDER — SODIUM CHLORIDE 0.9 % IJ SOLN
10.0000 mL | INTRAMUSCULAR | Status: DC | PRN
  Filled 2014-10-07: qty 10

## 2014-10-07 MED ORDER — HEPARIN SOD (PORK) LOCK FLUSH 100 UNIT/ML IV SOLN
500.0000 [IU] | Freq: Once | INTRAVENOUS | Status: DC | PRN
  Filled 2014-10-07: qty 5

## 2014-10-07 NOTE — Telephone Encounter (Signed)
Told [name] the her insurance required a prior authorization for her phenergan tablets.  The prior authorization was sent in by [facility] in Managed care and was denied. Company suggested that she try compazine. [name] will pick up phenergan prescription and pay for it out of pocket.

## 2014-10-07 NOTE — Progress Notes (Signed)
RECEIVED A FAX FROM [employer] CONCERNING A PRIOR AUTHORIZATION FOR PROMETHAZINE.

## 2014-10-07 NOTE — Progress Notes (Signed)
[facility] denied phenergan tablets stating medicare does not cover this for nausea and vomiting and because she is over 65

## 2014-10-07 NOTE — Patient Instructions (Signed)
[facility] Discharge Instructions for Patients Receiving Chemotherapy  Today you received the following chemotherapy agents Avastin.  To help prevent nausea and vomiting after your treatment, we encourage you to take your nausea medication.   If you develop nausea and vomiting that is not controlled by your nausea medication, call the clinic.   BELOW ARE SYMPTOMS THAT SHOULD BE REPORTED IMMEDIATELY:  *FEVER GREATER THAN 100.5 F  *CHILLS WITH OR WITHOUT FEVER  NAUSEA AND VOMITING THAT IS NOT CONTROLLED WITH YOUR NAUSEA MEDICATION  *UNUSUAL SHORTNESS OF BREATH  *UNUSUAL BRUISING OR BLEEDING  TENDERNESS IN MOUTH AND THROAT WITH OR WITHOUT PRESENCE OF ULCERS  *URINARY PROBLEMS  *BOWEL PROBLEMS  UNUSUAL RASH Items with * indicate a potential emergency and should be followed up as soon as possible.  Feel free to call the clinic you have any questions or concerns. The clinic phone number is [phone].    

## 2014-10-07 NOTE — Progress Notes (Signed)
Faxed phenergan pa form to [facility]

## 2014-10-13 ENCOUNTER — Encounter: Payer: Self-pay | Admitting: Genetic Counselor

## 2014-10-13 DIAGNOSIS — Z1379 Encounter for other screening for genetic and chromosomal anomalies: Secondary | ICD-10-CM | POA: Insufficient documentation

## 2014-10-13 NOTE — Progress Notes (Signed)
HPI: Ms. [name] was previously seen in the [facility] clinic due to a personal history of ovarian cancer and concerns regarding a hereditary predisposition to cancer. Please refer to our prior cancer genetics clinic note for more information regarding Ms. [name]'s medical, social and family histories, and our assessment and recommendations, at the time. Ms. [name] recent genetic test results were disclosed to her, as were recommendations warranted by these results. These results and recommendations are discussed in more detail below.  GENETIC TEST RESULTS: At the time of Ms. [name]'s visit, we recommended she pursue genetic testing of the [employer] gene panel. This test, which included sequencing and deletion/duplication analysis of the following genes:  ATM, BARD1, BRCA1, BRCA2, BRIP1, CDH1, CHEK2, EPCAM, MLH1, MRE11A, MSH2, MSH6, MUTYH, NBN, NF1, PALB2, PMS2, PTEN, RAD50, RAD51C, RAD51D, SMARCA4, STK11, and TP53.  The report date is October 04, 2014.  Testing was performed at [facility]. Genetic testing was normal, and did not reveal a deleterious mutation in these genes. The test report has been scanned into EPIC and is located under the Media tab.   We discussed with Ms. [name] that since the current genetic testing is not perfect, it is possible there may be a gene mutation in one of these genes that current testing cannot detect, but that chance is small. We also discussed, that it is possible that another gene that has not yet been discovered, or that we have not yet tested, is responsible for the cancer diagnoses in the family, and it is, therefore, important to remain in touch with cancer genetics in the future so that we can continue to offer Ms. [name] the most up to date genetic testing. Ms. [name] was disappointed in the test results, and was tearful, as she was hoping she would qualify for treatment based on hereditary cancer syndromes.    CANCER SCREENING  RECOMMENDATIONS: This result is reassuring and suggests that Ms. [name]'s cancer was most likely not due to an inherited predisposition associated with one of these genes. Most cancers happen by chance and this negative test, along with details of her family history, suggests that her cancer falls into this category. We, therefore, recommended she continue to follow the cancer management and screening guidelines provided by her oncology and primary providers.   RECOMMENDATIONS FOR FAMILY MEMBERS: Women in this family might be at some increased risk of developing cancer, over the general population risk, simply due to the family history of cancer. We recommended women in this family have a yearly mammogram beginning at age 70, an an annual clinical breast exam, and perform monthly breast self-exams. Women in this family should also have a gynecological exam as recommended by their primary provider. All family members should have a colonoscopy by age 44.  FOLLOW-UP: Lastly, we discussed with Ms. [name] that cancer genetics is a rapidly advancing field and it is possible that new genetic tests will be appropriate for her and/or her family members in the future. We encouraged her to remain in contact with cancer genetics on an annual basis so we can update her personal and family histories and let her know of advances in cancer genetics that may benefit this family.   Our contact number was provided. Ms.. [name]'s questions were answered to her satisfaction, and she knows she is welcome to call us at anytime with additional questions or concerns.   Roma Kayser, MS, [facility] Certified Genetic Counselor [email or web address]

## 2014-10-14 ENCOUNTER — Other Ambulatory Visit: Payer: Self-pay | Admitting: Oncology

## 2014-10-14 ENCOUNTER — Telehealth: Payer: Self-pay | Admitting: Oncology

## 2014-10-14 ENCOUNTER — Telehealth: Payer: Self-pay | Admitting: *Deleted

## 2014-10-14 DIAGNOSIS — C55 Malignant neoplasm of uterus, part unspecified: Secondary | ICD-10-CM

## 2014-10-14 DIAGNOSIS — C561 Malignant neoplasm of right ovary: Secondary | ICD-10-CM

## 2014-10-14 MED ORDER — HYDROMORPHONE HCL 2 MG PO TABS: ORAL_TABLET | ORAL | Status: DC

## 2014-10-14 MED ORDER — FENTANYL 25 MCG/HR TD PT72: 25.0000 ug | MEDICATED_PATCH | TRANSDERMAL | Status: DC

## 2014-10-14 NOTE — Telephone Encounter (Signed)
Pt left VM stating she is almost out of dilaudid tablets - has 7 tablets left. Called pt back and she says she is still having right sided abdominal pain frequently. Patient became tearful on the phone and states she just wants to feel better. She is still having intermittent nausea and no energy. Discussed with Dr. Marko Plume - refill done for Dilaudid tablets and pt started on Fentanyl patch. Reviewed instructions for Fentanyl patch over the phone with pt - she is agreeable to change the patch every 3 days and to use the dilaudid tablets for breakthrough pain. Dr. Marko Plume will see pt on 10/18/14 - appt made by scheduling. Called pt and let her know of new appt and that two prescriptions are in the prescription binder. Per pt, her daughter Olivia Mackie will pick them up today.

## 2014-10-14 NOTE — Telephone Encounter (Signed)
added lb/LL for 12/22. per desk nurse she will contact pt.

## 2014-10-16 ENCOUNTER — Other Ambulatory Visit: Payer: Self-pay | Admitting: Oncology

## 2014-10-18 ENCOUNTER — Ambulatory Visit (HOSPITAL_BASED_OUTPATIENT_CLINIC_OR_DEPARTMENT_OTHER): Payer: Medicare Other | Admitting: Oncology

## 2014-10-18 ENCOUNTER — Encounter: Payer: Self-pay | Admitting: Oncology

## 2014-10-18 ENCOUNTER — Telehealth: Payer: Self-pay | Admitting: Oncology

## 2014-10-18 ENCOUNTER — Telehealth: Payer: Self-pay

## 2014-10-18 VITALS — BP 121/62 | HR 97 | Temp 98.1°F | Resp 18 | Ht 67.0 in | Wt 182.1 lb

## 2014-10-18 DIAGNOSIS — R3911 Hesitancy of micturition: Secondary | ICD-10-CM

## 2014-10-18 DIAGNOSIS — R319 Hematuria, unspecified: Secondary | ICD-10-CM

## 2014-10-18 DIAGNOSIS — G893 Neoplasm related pain (acute) (chronic): Secondary | ICD-10-CM | POA: Insufficient documentation

## 2014-10-18 DIAGNOSIS — M1008 Idiopathic gout, vertebrae: Secondary | ICD-10-CM

## 2014-10-18 DIAGNOSIS — C561 Malignant neoplasm of right ovary: Secondary | ICD-10-CM

## 2014-10-18 DIAGNOSIS — C569 Malignant neoplasm of unspecified ovary: Secondary | ICD-10-CM

## 2014-10-18 DIAGNOSIS — N200 Calculus of kidney: Secondary | ICD-10-CM | POA: Insufficient documentation

## 2014-10-18 LAB — URINALYSIS, MICROSCOPIC - CHCC
BILIRUBIN (URINE): NEGATIVE
GLUCOSE UR CHCC: NEGATIVE mg/dL
KETONES: NEGATIVE mg/dL
Nitrite: NEGATIVE
Protein: 300 mg/dL
SPECIFIC GRAVITY, URINE: 1.03 (ref 1.003–1.035)
Urobilinogen, UR: 0.2 mg/dL (ref 0.2–1)
pH: 6 (ref 4.6–8.0)

## 2014-10-18 LAB — CBC WITH DIFFERENTIAL/PLATELET
BASO%: 1 % (ref 0.0–2.0)
BASOS ABS: 0 10*3/uL (ref 0.0–0.1)
EOS%: 0.6 % (ref 0.0–7.0)
Eosinophils Absolute: 0 10*3/uL (ref 0.0–0.5)
HEMATOCRIT: 30.9 % — AB (ref 34.8–46.6)
HEMOGLOBIN: 10.2 g/dL — AB (ref 11.6–15.9)
LYMPH%: 12.7 % — AB (ref 14.0–49.7)
MCH: 29.6 pg (ref 25.1–34.0)
MCHC: 33.1 g/dL (ref 31.5–36.0)
MCV: 89.5 fL (ref 79.5–101.0)
MONO#: 0.8 10*3/uL (ref 0.1–0.9)
MONO%: 17.3 % — AB (ref 0.0–14.0)
NEUT#: 3 10*3/uL (ref 1.5–6.5)
NEUT%: 68.4 % (ref 38.4–76.8)
Platelets: 426 10*3/uL — ABNORMAL HIGH (ref 145–400)
RBC: 3.45 10*6/uL — ABNORMAL LOW (ref 3.70–5.45)
RDW: 14.2 % (ref 11.2–14.5)
WBC: 4.4 10*3/uL (ref 3.9–10.3)
lymph#: 0.6 10*3/uL — ABNORMAL LOW (ref 0.9–3.3)

## 2014-10-18 LAB — COMPREHENSIVE METABOLIC PANEL (CC13)
ALK PHOS: 70 U/L (ref 40–150)
ALT: 12 U/L (ref 0–55)
AST: 27 U/L (ref 5–34)
Albumin: 3.5 g/dL (ref 3.5–5.0)
Anion Gap: 10 mEq/L (ref 3–11)
BILIRUBIN TOTAL: 0.28 mg/dL (ref 0.20–1.20)
BUN: 14.9 mg/dL (ref 7.0–26.0)
CO2: 29 mEq/L (ref 22–29)
CREATININE: 1 mg/dL (ref 0.6–1.1)
Calcium: 9.8 mg/dL (ref 8.4–10.4)
Chloride: 102 mEq/L (ref 98–109)
EGFR: 61 mL/min/{1.73_m2} — ABNORMAL LOW (ref >90)
Glucose: 110 mg/dl (ref 70–140)
Potassium: 4 mEq/L (ref 3.5–5.1)
Sodium: 142 mEq/L (ref 136–145)
Total Protein: 7.5 g/dL (ref 6.4–8.3)

## 2014-10-18 MED ORDER — CIPROFLOXACIN HCL 250 MG PO TABS: 250.0000 mg | ORAL_TABLET | Freq: Two times a day (BID) | ORAL | Status: DC

## 2014-10-18 MED ORDER — FENTANYL 25 MCG/HR TD PT72: 25.0000 ug | MEDICATED_PATCH | TRANSDERMAL | Status: DC

## 2014-10-18 MED ORDER — HYDROMORPHONE HCL 2 MG PO TABS: ORAL_TABLET | ORAL | Status: DC

## 2014-10-18 MED ORDER — LIDOCAINE-PRILOCAINE 2.5-2.5 % EX CREA: 1.0000 "application " | TOPICAL_CREAM | CUTANEOUS | Status: DC | PRN

## 2014-10-18 NOTE — Progress Notes (Signed)
OFFICE PROGRESS NOTE   10/18/2014   Physicians:Paola Alycia Rossetti, Gareth Eagle (PCP), Laurence Spates, Judeth Horn, [place]/ Louis Meckel   Office note from Dr Louis Meckel 10-04-14 reviewed by MD for this visit, sent to be scanned into this EMR.  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 Patient had spoken with [facility] RN on 10-14-14 due to ongoing pain. She did not fill new prescription for duragesic 25 that day due to misunderstanding that copay for each patch was $40; RN has confirmed with pharmacy now that copay for 5 patches is $40. She is using dilaudid 2 mg every 6 hrs, with discomfort not fully resolved after doses. She has not tried 4 mg doses.  She had first doxil + avastin on 09-20-14, cycle 2 due 10-24-14. She tolerated the first doxil well. She saw Dr Louis Meckel on 10-04-14, with recommendation to increase urine pH above 6 (stop Vit C and cranberry juice, begin potassium citrate) and added flomax. She did not tolerate potassium citrate, tho symptoms with this not clear to me. She cannot tell improvement with flomax, still feels she is not emptying bladder, with urine "trickle". She reports sediment in urine still. UA from Dr Herrick's note 10-04-14 had pH 5.0, 21-50 WBC, 0-2 RBC, small LE, no crystals. She is not aware of fever and denies gross hematuria. She has more discomfort in low pelvis than RUQ now.  Appetite is decreased, fluids always more difficult for her than food. slight intermittent nausea for which she took phenergan this AM. Bowels moving as usual by colostomy. She had area of bleeding on vulva earlier this week.  PAC in Flu vaccine done Genetics testing  09-12-14 normal  ONCOLOGIC HISTORY   Ovarian carcinosarcoma   02/09/2014 Initial Diagnosis Ovarian carcinosarcoma   02/15/2014  Surgery IIIC MMMT ovary   03/25/2014 - 07/15/2014 Chemotherapy paclitaxel and carboplatin x6 with addition of avastin for the last three cycles.   08/15/2014 Progression Increased peritoneal disease by CT scan  Patient had been in usual generally good health, status post remote hysterectomy, until abdominal pain with radiation to right shoulder in Jan 2015. She was admitted thru ED 11-21-13 with [place] evidence of cholelithiasis, tho no signs of cholecystitis then. (Note patient tells me that she had CT in Jan, however this EMR has only abdominal US then). She was taken to laparoscopic cholecystectomy by Dr Hulen Skains on 11-22-13, with multiple large stones in specimen and benign pathology. Her post operative course was not remarkable and the pain initially resolved entirely, with patient feeling well at post op visit in mid Feb. By ~March she had more abdominal cramping and dull pain, with abdominal distension and was treated with cipro for apparent UTI. She was seen in ED on 02-03-14 with concern for recurrent UTI, with abdominal symptoms as above + intermittent nausea and vomiting, with abdomen tender on exam then. CT AP found large heterogeneous mass lower mid abdomen/ pelvis 19.4 x 13.6 x 14.3 cm with soft tissue along pericolic gutters and left anterior abdomen, with mild bilateral hydronephrosis and small right pleural effusion. CA 125 resulted at 67.9 and CEA was 1.0. She saw Dr Alycia Rossetti on 02-09-14 and was hospitalized at [facility] from 02-11-14 thru 02-25-14. She went to exploratory laparotomy on 02-15-14, with findings of 20 cm mass filling pelvis and involving rectosigmoid and bilateral ovaries and tubes, with  extension of tumor deep in pelvis and along rectum. Surgery was BSO/omentectomy and rectosigmoid resection with reanastomosis was performed, then diverting loop colostomy due to intraoperative concerns about the proximal rectal stump. She had bilateral ureterolysis for retroperitoneal fibrosis. At completion of procedure,  which was optimal debulking, there were scattered <1 cm nodules on peritoneum, small bowel and small bowel mesentery, with no appreciable upper abdominal disease. She was transfused 2 units PRBCs intraoperatively. [facility] [phone] from 02-15-2014) found high grade carcinosarcoma 17 cm originating in one ovary which was adherent to multiple structures, metastatic disease in 1.8 cm peritoneal nodule as well as sigmoid colon nodule 1.5 cm and in omentum, no lymph nodes submitted, no identified LVSI, margin of one end of colon positive, The colon specimen also had adenomatous polyp without high grade dysplasia.  She was hospitalized at [facility] from 02-11-14 thru 02-25-14. Preoperatively she had acute kidney injury, with increase in creatinine from baseline 0.7 to high of 2.17; creatinine postoperatively was 1.52. She needed NG following surgery.  PAC was placed at [facility] on 02-21-14.  Recommendations from [facility] multidisciplinary conference 02-23-2014 was for chemotherapy with platinum and taxane. She had cycle 1 taxol carboplatin on 6-71-24, complicated by severe taxol aches and by neutropenia day 11, gCSF support added. She has needed IVF/ IV antiemetics on ~ day 4 additionally.  CT CAP 05-23-14 showed peritoneal involvement such that avastin was added to chemotherapy beginning cycle 4. She completed 6 cycles of carbo taxol 07-15-14. CT AP 08-15-14 showed further progression along liver, presacral and deposits in pelvis. Treatment changed to doxil 11-24, continuing avastin.   Review of systems as above, also: Slept last pm. No LE swelling. No increased SOB. No problems with PAC. Remainder of 10 point Review of Systems negative.  Objective:  Vital signs in last 24 hours:  BP 121/62 mmHg  Pulse 97  Temp(Src) 98.1 F (36.7 C) (Oral)  Resp 18  Ht _0  (1.702 m)  Wt 182 lb 1.6 oz (82.6 kg)  BMI 28.51 kg/m2 Weight up 1 lb. Looks more fatigued and a little pale, NAD. Respirations not labored RA Alert,  oriented and appropriate. Ambulatory without assistance.  Alopecia  HEENT:PERRL, sclerae not icteric. Oral mucosa moist without lesions, posterior pharynx clear.  Neck supple. No JVD.  Lymphatics:no cervical,supraclavicular adenopathy Resp: clear to auscultation bilaterally and normal percussion bilaterally Cardio: regular rate and rhythm. No gallop. GI: soft, nontender, not distended, no organomegaly. Some bowel sounds. Stoma seems to have more prolapse, soft stool in ostomy. Surgical incision not remarkable. Musculoskeletal/ Extremities: without pitting edema, cords, tenderness Neuro: no peripheral neuropathy. Otherwise nonfocal Skin without ecchymosis, petechiae. No ulcerations or bleeding areas on vulva or perirectal area. Portacath-without erythema or tenderness  Lab Results:  Results for orders placed or performed in visit on 10/18/14  CBC with Differential  Result Value Ref Range   WBC 4.4 3.9 - 10.3 10e3/uL   NEUT# 3.0 1.5 - 6.5 10e3/uL   HGB 10.2 (L) 11.6 - 15.9 g/dL   HCT 30.9 (L) 34.8 - 46.6 %   Platelets 426 (H) 145 - 400 10e3/uL   MCV 89.5 79.5 - 101.0 fL   MCH 29.6 25.1 - 34.0 pg   MCHC 33.1 31.5 - 36.0 g/dL   RBC 3.45 (L) 3.70 - 5.45 10e6/uL   RDW 14.2 11.2 - 14.5 %   lymph# 0.6 (L) 0.9 - 3.3 10e3/uL   MONO# 0.8 0.1 - 0.9 10e3/uL   Eosinophils Absolute 0.0 0.0 - 0.5 10e3/uL  Basophils Absolute 0.0 0.0 - 0.1 10e3/uL   NEUT% 68.4 38.4 - 76.8 %   LYMPH% 12.7 (L) 14.0 - 49.7 %   MONO% 17.3 (H) 0.0 - 14.0 %   EOS% 0.6 0.0 - 7.0 %   BASO% 1.0 0.0 - 2.0 %  Comprehensive metabolic panel  Result Value Ref Range   Sodium 142 136 - 145 mEq/L   Potassium 4.0 3.5 - 5.1 mEq/L   Chloride 102 98 - 109 mEq/L   CO2 29 22 - 29 mEq/L   Glucose 110 70 - 140 mg/dl   BUN 14.9 7.0 - 26.0 mg/dL   Creatinine 1.0 0.6 - 1.1 mg/dL   Total Bilirubin 0.28 0.20 - 1.20 mg/dL   Alkaline Phosphatase 70 40 - 150 U/L   AST 27 5 - 34 U/L   ALT 12 0 - 55 U/L   Total Protein 7.5 6.4 - 8.3  g/dL   Albumin 3.5 3.5 - 5.0 g/dL   Calcium 9.8 8.4 - 10.4 mg/dL   Anion Gap 10 3 - 11 mEq/L   EGFR 61 (L) >90 ml/min/1.73 m2   UA available after visit has pH 6.0, small LE, WBC 11-20, RBC 21-50, moderate bacteria, moderate epithelial, large blood, small LE.  Culture pending  Studies/Results:  No results found.  Last CT AP 10-19  Medications: I have reviewed the patient's current medications. We have discussed rationale for sustained release duragesic, which she is willing to try now. Prescriptions rewritten for duragesic 25 and for dilaudid. Empiric cipro 250 mg pending urine culture results.  DISCUSSION:  Medications as above. Will repeat CT AP in next 1-2 weeks in case disease in pelvis or other might be better addressed with RT. May try adding allopurinol if ok with other meds next week.  Assessment/Plan:  1.IIIC carcinosarcoma of ovary: post optimal debulking including rectosigmoid resection and diverting loop colostomy at [facility] 02-15-14. Evidence of peritoneal disease post 3 cycles of taxol carboplatin, with avastin added for next 3 cycles. Progression by restaging CT 08-15-14. Doxil every 3-4 weeks with avastin, this chosen in preference to ifosfamide as previously discussed, #1 doxil with avastin 09-20-14. Repeat CT AP. I will see her back 12-28 with planned cycle 2 doxil + avastin that day. 2. Difficulty emptying bladder, possible UTI uric acid nephrolithiasis which Dr Louis Meckel feels is chemo related. Follow up urine culture pending, empiric cipro begin today until culture results. Urine pH up to 6.0 off vit C and cranberry juice (did not tolerate potassium citrate). Consider allopurinol if otherwise stable next week. 3.PAC placed at [facility] 4.hypothyroid on replacement, post partial thyroidectomy  5.HTN  6.acute renal insufficiency around recent surgery, resolved.  7.flu vaccine given  8. 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 They will call prior to visit next week if needed.  Time spent 40 min including >50% counseling and coordination of care  LIVESAY,LENNIS P, MD   10/18/2014, 10:05 AM

## 2014-10-18 NOTE — Telephone Encounter (Signed)
Told [name] that the urinalysis suggests a UTI. Dr. Marko Plume  Wants her to start Cipro 250 mg bid until urine culture finalized Patient verbalized understanding. Told [name] that she is set up for IVF at the [facility] at 1000 on 10-26-14.  Orders in Epic under sign and held.

## 2014-10-18 NOTE — Telephone Encounter (Signed)
per pof to sch pt appt-sent pt email to sch pt trmt-pt stated will get updated sch on 12/28

## 2014-10-19 ENCOUNTER — Ambulatory Visit (HOSPITAL_COMMUNITY)
Admission: RE | Admit: 2014-10-19 | Discharge: 2014-10-19 | Disposition: A | Discharge status: Home / Self Care | Payer: Medicare Other | Source: Ambulatory Visit | Attending: Oncology | Admitting: Oncology

## 2014-10-19 ENCOUNTER — Encounter (HOSPITAL_COMMUNITY): Payer: Self-pay

## 2014-10-19 DIAGNOSIS — C561 Malignant neoplasm of right ovary: Secondary | ICD-10-CM | POA: Insufficient documentation

## 2014-10-19 DIAGNOSIS — Z933 Colostomy status: Secondary | ICD-10-CM | POA: Diagnosis not present

## 2014-10-19 DIAGNOSIS — Z90710 Acquired absence of both cervix and uterus: Secondary | ICD-10-CM | POA: Insufficient documentation

## 2014-10-19 DIAGNOSIS — C569 Malignant neoplasm of unspecified ovary: Secondary | ICD-10-CM

## 2014-10-19 DIAGNOSIS — Z9049 Acquired absence of other specified parts of digestive tract: Secondary | ICD-10-CM | POA: Diagnosis not present

## 2014-10-19 DIAGNOSIS — K435 Parastomal hernia without obstruction or  gangrene: Secondary | ICD-10-CM | POA: Insufficient documentation

## 2014-10-19 DIAGNOSIS — C787 Secondary malignant neoplasm of liver and intrahepatic bile duct: Secondary | ICD-10-CM | POA: Insufficient documentation

## 2014-10-19 MED ORDER — IOHEXOL 300 MG/ML  SOLN
100.0000 mL | Freq: Once | INTRAMUSCULAR | Status: AC | PRN
  Administered 2014-10-19: 100 mL via INTRAVENOUS

## 2014-10-20 ENCOUNTER — Telehealth: Payer: Self-pay

## 2014-10-20 LAB — URINE CULTURE

## 2014-10-20 NOTE — Telephone Encounter (Signed)
Told daughter that the urine culture was negative for an infection per Dr. Marko Plume. She can let her mother know that as this nurse could not reach patient or leave a message in a VM.  Daughter will relay the message.

## 2014-10-23 ENCOUNTER — Other Ambulatory Visit: Payer: Self-pay | Admitting: Oncology

## 2014-10-23 DIAGNOSIS — C562 Malignant neoplasm of left ovary: Secondary | ICD-10-CM

## 2014-10-24 ENCOUNTER — Encounter: Payer: Self-pay | Admitting: Oncology

## 2014-10-24 ENCOUNTER — Ambulatory Visit (HOSPITAL_BASED_OUTPATIENT_CLINIC_OR_DEPARTMENT_OTHER): Payer: Medicare Other

## 2014-10-24 ENCOUNTER — Ambulatory Visit (HOSPITAL_BASED_OUTPATIENT_CLINIC_OR_DEPARTMENT_OTHER): Payer: Medicare Other | Admitting: Oncology

## 2014-10-24 ENCOUNTER — Other Ambulatory Visit (HOSPITAL_BASED_OUTPATIENT_CLINIC_OR_DEPARTMENT_OTHER): Payer: Medicare Other

## 2014-10-24 VITALS — BP 133/75 | HR 89 | Temp 98.1°F | Resp 18 | Ht 67.0 in | Wt 180.3 lb

## 2014-10-24 DIAGNOSIS — C561 Malignant neoplasm of right ovary: Secondary | ICD-10-CM

## 2014-10-24 DIAGNOSIS — C569 Malignant neoplasm of unspecified ovary: Secondary | ICD-10-CM

## 2014-10-24 DIAGNOSIS — N200 Calculus of kidney: Secondary | ICD-10-CM

## 2014-10-24 DIAGNOSIS — E039 Hypothyroidism, unspecified: Secondary | ICD-10-CM

## 2014-10-24 DIAGNOSIS — C562 Malignant neoplasm of left ovary: Secondary | ICD-10-CM

## 2014-10-24 DIAGNOSIS — D649 Anemia, unspecified: Secondary | ICD-10-CM

## 2014-10-24 DIAGNOSIS — Z5111 Encounter for antineoplastic chemotherapy: Secondary | ICD-10-CM

## 2014-10-24 DIAGNOSIS — C55 Malignant neoplasm of uterus, part unspecified: Secondary | ICD-10-CM

## 2014-10-24 DIAGNOSIS — C786 Secondary malignant neoplasm of retroperitoneum and peritoneum: Secondary | ICD-10-CM

## 2014-10-24 DIAGNOSIS — Z5112 Encounter for antineoplastic immunotherapy: Secondary | ICD-10-CM

## 2014-10-24 DIAGNOSIS — G893 Neoplasm related pain (acute) (chronic): Secondary | ICD-10-CM

## 2014-10-24 DIAGNOSIS — C801 Malignant (primary) neoplasm, unspecified: Secondary | ICD-10-CM

## 2014-10-24 LAB — COMPREHENSIVE METABOLIC PANEL (CC13)
ALBUMIN: 3.5 g/dL (ref 3.5–5.0)
ALK PHOS: 67 U/L (ref 40–150)
ALT: 15 U/L (ref 0–55)
ANION GAP: 10 meq/L (ref 3–11)
AST: 28 U/L (ref 5–34)
BUN: 15.8 mg/dL (ref 7.0–26.0)
CHLORIDE: 104 meq/L (ref 98–109)
CO2: 29 mEq/L (ref 22–29)
Calcium: 9.9 mg/dL (ref 8.4–10.4)
Creatinine: 1 mg/dL (ref 0.6–1.1)
EGFR: 59 mL/min/{1.73_m2} — ABNORMAL LOW (ref >90)
Glucose: 89 mg/dl (ref 70–140)
Potassium: 4.8 mEq/L (ref 3.5–5.1)
SODIUM: 142 meq/L (ref 136–145)
TOTAL PROTEIN: 7.6 g/dL (ref 6.4–8.3)
Total Bilirubin: 0.31 mg/dL (ref 0.20–1.20)

## 2014-10-24 LAB — CBC WITH DIFFERENTIAL/PLATELET
BASO%: 0.3 % (ref 0.0–2.0)
Basophils Absolute: 0 10*3/uL (ref 0.0–0.1)
EOS%: 0.3 % (ref 0.0–7.0)
Eosinophils Absolute: 0 10*3/uL (ref 0.0–0.5)
HCT: 32.4 % — ABNORMAL LOW (ref 34.8–46.6)
HGB: 10.5 g/dL — ABNORMAL LOW (ref 11.6–15.9)
LYMPH#: 0.6 10*3/uL — AB (ref 0.9–3.3)
LYMPH%: 9.7 % — ABNORMAL LOW (ref 14.0–49.7)
MCH: 29.2 pg (ref 25.1–34.0)
MCHC: 32.4 g/dL (ref 31.5–36.0)
MCV: 90.3 fL (ref 79.5–101.0)
MONO#: 0.8 10*3/uL (ref 0.1–0.9)
MONO%: 13.9 % (ref 0.0–14.0)
NEUT#: 4.5 10*3/uL (ref 1.5–6.5)
NEUT%: 75.8 % (ref 38.4–76.8)
Platelets: 298 10*3/uL (ref 145–400)
RBC: 3.59 10*6/uL — AB (ref 3.70–5.45)
RDW: 13.7 % (ref 11.2–14.5)
WBC: 6 10*3/uL (ref 3.9–10.3)

## 2014-10-24 LAB — UA PROTEIN, DIPSTICK - CHCC: Protein, ur: <30 mg/dL

## 2014-10-24 MED ORDER — HEPARIN SOD (PORK) LOCK FLUSH 100 UNIT/ML IV SOLN
500.0000 [IU] | Freq: Once | INTRAVENOUS | Status: AC | PRN
  Administered 2014-10-24: 500 [IU]
  Filled 2014-10-24: qty 5

## 2014-10-24 MED ORDER — DOXORUBICIN HCL LIPOSOMAL CHEMO INJECTION 2 MG/ML
40.0000 mg/m2 | Freq: Once | INTRAVENOUS | Status: AC
  Administered 2014-10-24: 80 mg via INTRAVENOUS
  Filled 2014-10-24: qty 40

## 2014-10-24 MED ORDER — SODIUM CHLORIDE 0.9 % IV SOLN
Freq: Once | INTRAVENOUS | Status: AC
  Administered 2014-10-24: 14:00:00 via INTRAVENOUS

## 2014-10-24 MED ORDER — DEXAMETHASONE SODIUM PHOSPHATE 10 MG/ML IJ SOLN
10.0000 mg | Freq: Once | INTRAMUSCULAR | Status: AC
  Administered 2014-10-24: 10 mg via INTRAVENOUS

## 2014-10-24 MED ORDER — SODIUM CHLORIDE 0.9 % IJ SOLN
10.0000 mL | INTRAMUSCULAR | Status: DC | PRN
Start: 2014-10-24 — End: 2014-10-24
  Administered 2014-10-24: 10 mL
  Filled 2014-10-24: qty 10

## 2014-10-24 MED ORDER — DEXAMETHASONE SODIUM PHOSPHATE 10 MG/ML IJ SOLN
INTRAMUSCULAR | Status: AC
  Filled 2014-10-24: qty 1

## 2014-10-24 MED ORDER — ONDANSETRON 8 MG/50ML IVPB (CHCC)
8.0000 mg | Freq: Once | INTRAVENOUS | Status: AC
  Administered 2014-10-24: 8 mg via INTRAVENOUS

## 2014-10-24 MED ORDER — ALLOPURINOL 100 MG PO TABS: 200.0000 mg | ORAL_TABLET | Freq: Every day | ORAL | Status: DC

## 2014-10-24 MED ORDER — FENTANYL 25 MCG/HR TD PT72: 25.0000 ug | MEDICATED_PATCH | TRANSDERMAL | Status: DC

## 2014-10-24 MED ORDER — BEVACIZUMAB CHEMO INJECTION 400 MG/16ML
15.0000 mg/kg | Freq: Once | INTRAVENOUS | Status: AC
  Administered 2014-10-24: 1250 mg via INTRAVENOUS
  Filled 2014-10-24: qty 50

## 2014-10-24 MED ORDER — ONDANSETRON 8 MG/NS 50 ML IVPB
INTRAVENOUS | Status: AC
  Filled 2014-10-24: qty 8

## 2014-10-24 NOTE — Progress Notes (Signed)
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 She is to have additional IVF on 10-26-14 (at [facility] due to space limitations). She had repeat CT AP 10-19-14, which shows extensive disease around liver, in pelvis displacing bladder, and in parastomal hernia. She has been much more comfortable since beginning duragesic 25 mcg on 10-22-14, this prescribed at visit 10-18-14, however patient did not want to risk any excessive sedation during Christmas so did not begin promptly. Since starting the duragesic she has needed only 1 dilaudid in last 24 hours, for discomfort RUQ. She has not been too sedated and has not had increased nausea. Output from ostomy is ~ usual. Appetite is only fair, however she is eating. She denies bleeding. She is voiding with some effort. She has not had symptoms of further nephrolithiasis.  PAC in Flu vaccine done Genetics testing 08-2014 was normal.    ONCOLOGIC HISTORY   Ovarian carcinosarcoma   02/09/2014 Initial Diagnosis Ovarian carcinosarcoma   02/15/2014 Surgery IIIC MMMT ovary   03/25/2014 - 07/15/2014 Chemotherapy paclitaxel and carboplatin x6 with addition of avastin for the last three cycles.   08/15/2014 Progression Increased peritoneal disease by CT scan  Patient had been in usual generally good health, status post remote hysterectomy, until abdominal pain with radiation to right shoulder in Jan 2015. She was admitted thru ED 11-21-13 with [place] evidence of cholelithiasis, tho no signs of cholecystitis then. (Note patient tells me that she had CT in Jan, however this EMR has only abdominal US then). She was taken to laparoscopic cholecystectomy by Dr Lindie Spruce on 11-22-13, with multiple large stones in  specimen and benign pathology. Her post operative course was not remarkable and the pain initially resolved entirely, with patient feeling well at post op visit in mid Feb. By ~March she had more abdominal cramping and dull pain, with abdominal distension and was treated with cipro for apparent UTI. She was seen in ED on 02-03-14 with concern for recurrent UTI, with abdominal symptoms as above + intermittent nausea and vomiting, with abdomen tender on exam then. CT AP found large heterogeneous mass lower mid abdomen/ pelvis 19.4 x 13.6 x 14.3 cm with soft tissue along pericolic gutters and left anterior abdomen, with mild bilateral hydronephrosis and small right pleural effusion. CA 125 resulted at 67.9 and CEA was 1.0. She saw Dr Duard Brady on 02-09-14 and was hospitalized at [facility] from 02-11-14 thru 02-25-14. She went to exploratory laparotomy on 02-15-14, with findings of 20 cm mass filling pelvis and involving rectosigmoid and bilateral ovaries and tubes, with extension of tumor deep in pelvis and along rectum. Surgery was BSO/omentectomy and rectosigmoid resection with reanastomosis was performed, then diverting loop colostomy due to intraoperative concerns about the proximal rectal stump. She had bilateral ureterolysis for retroperitoneal fibrosis. At completion of procedure, which was optimal debulking, there were scattered <1 cm nodules on peritoneum, small bowel and small bowel mesentery, with no appreciable upper abdominal disease. She was transfused 2 units PRBCs intraoperatively. [facility] [phone] from 02-15-2014) found high grade carcinosarcoma 17 cm originating in one ovary which was adherent to multiple structures, metastatic disease in 1.8 cm peritoneal nodule as well as sigmoid colon nodule 1.5 cm and in omentum, no lymph nodes submitted, no identified LVSI, margin of  one end of colon positive, The colon specimen also had adenomatous polyp without high grade dysplasia.  She was hospitalized at [facility] from  02-11-14 thru 02-25-14. Preoperatively she had acute kidney injury, with increase in creatinine from baseline 0.7 to high of 2.17; creatinine postoperatively was 1.52. She needed NG following surgery.  PAC was placed at [facility] on 02-21-14.  Recommendations from [facility] multidisciplinary conference 02-23-2014 was for chemotherapy with platinum and taxane. She had cycle 1 taxol carboplatin on 01-10-16, complicated by severe taxol aches and by neutropenia day 11, gCSF support added. She has needed IVF/ IV antiemetics on ~ day 4 additionally.  CT CAP 05-23-14 showed peritoneal involvement such that avastin was added to chemotherapy beginning cycle 4. She completed 6 cycles of carbo taxol 07-15-14. CT AP 08-15-14 showed further progression along liver, presacral and deposits in pelvis. Treatment changed to doxil 11-24, continuing avastin. Repeat CT AP 10-19-14 done due to worsening pain, showed significant progression including perihepatic and pelvis compared with mid Oct.  Review of systems as above, also: No fever. No increased SOB or cough. No frank hematuria. Remainder of 10 point Review of Systems negative.  Objective:  Vital signs in last 24 hours:  BP 133/75 mmHg  Pulse 89  Temp(Src) 98.1 F (36.7 C) (Oral)  Resp 18  Ht $R'5\' 7"'ZF$  (1.702 m)  Wt 180 lb 4.8 oz (81.784 kg)  BMI 28.23 kg/m2 weight up 1 lb.  Alert, oriented and appropriate. Ambulatory without assistance. Somewhat pale, but overall clearly more comfortable than at last visit. Respirations not labored RA Alopecia  HEENT:PERRL, sclerae not icteric. Oral mucosa moist without lesions, posterior pharynx clear.  Neck supple. No JVD.  Lymphatics:no cervical,supraclavicular adenopathy Resp: clear to auscultation bilaterally and normal percussion bilaterally Cardio: regular rate and rhythm. No gallop. GI: soft, nontender, not distended, full around ostomy but not tender with stoma protruding more. Normally active bowel sounds. Surgical incision not  remarkable. Musculoskeletal/ Extremities: without pitting edema, cords, tenderness Neuro: no peripheral neuropathy. Otherwise nonfocal Skin without rash, ecchymosis, petechiae including around adhesive at ostomy. Portacath-without erythema or tenderness  Lab Results:  Results for orders placed or performed in visit on 10/24/14  CBC with Differential  Result Value Ref Range   WBC 6.0 3.9 - 10.3 10e3/uL   NEUT# 4.5 1.5 - 6.5 10e3/uL   HGB 10.5 (L) 11.6 - 15.9 g/dL   HCT 32.4 (L) 34.8 - 46.6 %   Platelets 298 145 - 400 10e3/uL   MCV 90.3 79.5 - 101.0 fL   MCH 29.2 25.1 - 34.0 pg   MCHC 32.4 31.5 - 36.0 g/dL   RBC 3.59 (L) 3.70 - 5.45 10e6/uL   RDW 13.7 11.2 - 14.5 %   lymph# 0.6 (L) 0.9 - 3.3 10e3/uL   MONO# 0.8 0.1 - 0.9 10e3/uL   Eosinophils Absolute 0.0 0.0 - 0.5 10e3/uL   Basophils Absolute 0.0 0.0 - 0.1 10e3/uL   NEUT% 75.8 38.4 - 76.8 %   LYMPH% 9.7 (L) 14.0 - 49.7 %   MONO% 13.9 0.0 - 14.0 %   EOS% 0.3 0.0 - 7.0 %   BASO% 0.3 0.0 - 2.0 %  Comprehensive metabolic panel (Cmet) - [facility]  Result Value Ref Range   Sodium 142 136 - 145 mEq/L   Potassium 4.8 3.5 - 5.1 mEq/L   Chloride 104 98 - 109 mEq/L   CO2 29 22 - 29 mEq/L   Glucose 89 70 - 140 mg/dl   BUN 15.8 7.0 - 26.0 mg/dL  Creatinine 1.0 0.6 - 1.1 mg/dL   Total Bilirubin 0.31 0.20 - 1.20 mg/dL   Alkaline Phosphatase 67 40 - 150 U/L   AST 28 5 - 34 U/L   ALT 15 0 - 55 U/L   Total Protein 7.6 6.4 - 8.3 g/dL   Albumin 3.5 3.5 - 5.0 g/dL   Calcium 9.9 8.4 - 10.4 mg/dL   Anion Gap 10 3 - 11 mEq/L   EGFR 59 (L) >90 ml/min/1.73 m2  Urine protein by dipstick - [facility]  Result Value Ref Range   Protein, ur < 30 Negative- <30 mg/dL   Urine culture 12-22 insignificant growth.  Studies/Results: CT ABDOMEN AND PELVIS WITH CONTRAST  10-19-14  COMPARISON: CT of the abdomen and pelvis 08/19/2014. CT of the abdomen and pelvis 08/15/2014.  FINDINGS: Lower chest: Small hiatal hernia. Although there is an  apparent nodular opacity in the lower right hemithorax, correlation with sagittal and coronal reconstructions indicates that this is subdiaphragmatic.  Hepatobiliary: No cystic or solid hepatic lesions. No intra or extrahepatic biliary ductal dilatation. Status post cholecystectomy. However, there are extensive serosal implants upon the liver, most evident underneath the liver where it exerts significant mass effect upon the adjacent hepatic parenchyma. This is irregular in shape and therefore difficult to accurately measure, however, this is estimated to measure approximately 19.7 x 4.8 x 9.8 cm.  Pancreas: Unremarkable.  Spleen: No intrinsic splenic lesions. However, there do appear to be multiple small serosal implants upon the surface of the spleen.  Adrenals/Urinary Tract: Multiple well-defined low-attenuation lesions are again noted in the kidneys bilaterally, compatible with a combination of cysts and parapelvic cyst. Many of these lesions are too small to definitively characterize bilateral adrenal glands are normal in appearance. Mild left-sided hydroureteronephrosis, likely secondary to extrinsic compression of the left ureter, related to bulky left adnexal mass and left gonadal distribution lymphadenopathy. Urinary bladder is completely distorted by multiple large pelvic mass with, displaced anteriorly, but is otherwise unremarkable in appearance.  Stomach/Bowel: The appearance of the stomach is normal. No pathologic dilatation of small bowel or colon. However, there are diffuse implants upon the surface of the rectum (Hartmann's pouch) and small bowel throughout the low anatomic pelvis, compatible with advanced peritoneal spread of disease. Patient is status post left hemicolectomy with left-sided colostomy. There is a large parastomal hernia, which contains some loops of small bowel, but also contains multiple soft tissue lesions, compatible with soft  tissue metastasis, largest of which measures 3.4 x 2.6 cm.  Vascular/Lymphatic: Mild atherosclerosis is noted throughout the abdominal and pelvic vasculature, without evidence of aneurysm or dissection. Mildly enlarged portacaval lymph node measuring up to 2.7 x 1.8 cm. Extensive adenopathy along the left gonadal vein distribution, in addition to abnormal malignant soft tissue in the same distribution, with the largest lymph node in the left gonadal vein distribution measuring up to 1.7 cm in short axis.  Reproductive: Status post total abdominal hysterectomy and bilateral salpingo-oophorectomy. Large left adnexal mass, inseparable from extensive malignant soft tissue throughout the pelvis.  Other: Trace volume of ascites. No pneumoperitoneum.  Musculoskeletal: There are no aggressive appearing lytic or blastic lesions noted in the visualized portions of the skeleton.  IMPRESSION: 1. Today's study demonstrates significant progression of widespread metastatic disease in the abdomen and pelvis, with innumerable peritoneal implants, bulkiest in the low anatomic pelvis, widespread serosal implants on the surface of the spleen in the liver (bulkiest on the undersurface of the liver), and soft tissue implants extending into the  patient's parastomal hernia associated with her left-sided colostomy. 2. Additional incidental findings, as above.   PACs images reviewed by MD for visit, and PACs images also reviewed with patient and family member now. Difficult to identify bladder.    Medications: I have reviewed the patient's current medications. I have spoken with her pharmacy, which does not have duragesic prescription on file now; another duragesic prescription given as she will be out of this prior to next scheduled appointment.  Note urine pH last week was up to 6.0, needs to be >6.0 to lessen chance of further uric acid stones. Will add allopurinol 200 mg daily beginning ~ 2 weeks  after next doxil, in case any rash from allopurinol in addition to possible doxil rash.  DISCUSSION: CT findings as noted, tho this was compared to scan done 4 weeks prior to start of doxil. I have suggested that we ask radiation oncology if any directed treatment might be beneficial to pelvis or possibly peristomal area. As she tolerated first doxil without difficulty, it seems appropriate to proceed with doxil + avastin today as planned.  Skin care with doxil reviewed, including area of ostomy   Assessment/Plan: 1.IIIC carcinosarcoma of ovary: post optimal debulking including rectosigmoid resection and diverting loop colostomy at [facility] 02-15-14. Evidence of peritoneal disease post 3 cycles of taxol carboplatin, with avastin added for next 3 cycles. Progression by restaging CT 08-15-14. Doxil with avastin chosen in preference to ifosfamide as previously discussed, #1 doxil with avastin 09-20-14. Repeat CT AP 10-19-14 due to pain RUQ and pelvis significant progression compared with mid Oct. #2 doxil today, IVF 12-30, RT consultation (thank you). I will see her back in 2 weeks with avastin same day. Continue duragesic 25 mcg q 72 hrs with prn dilaudid. 2. Difficulty emptying bladder related to extent of pelvic tumor. No UTI last week 3. 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 Urine culture 12-22 negative tho symptoms better with short course cipro given while awaiting culture results. Try to increase urine pH and begin allopurinol in ~ 2 weeks. 4.hypothyroid on replacement, post partial thyroidectomy  5.HTN  6.acute renal insufficiency around surgery, resolved.  7.flu vaccine given  8. 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 Parastomal hernia containing tumor, ostomy still  functioning. 12.has been given information on Advance Directives. 13.PAC placed at [place] questions answered. She will call prior to next scheduled visit if needed. Chemo and avastin orders confirmed. IVF orders in for 12-30. Cc this note to RT and Drs Alycia Rossetti, Robie Ridge. Time spent 40 min including >50% counseling and coordination of care.   Graviela Nodal P, MD   10/24/2014, 12:03 PM

## 2014-10-24 NOTE — Patient Instructions (Signed)
[facility] Discharge Instructions for Patients Receiving Chemotherapy  Today you received the following chemotherapy agents: Avastin and doxil.  To help prevent nausea and vomiting after your treatment, we encourage you to take your nausea medication.   If you develop nausea and vomiting that is not controlled by your nausea medication, call the clinic.   BELOW ARE SYMPTOMS THAT SHOULD BE REPORTED IMMEDIATELY:  *FEVER GREATER THAN 100.5 F  *CHILLS WITH OR WITHOUT FEVER  NAUSEA AND VOMITING THAT IS NOT CONTROLLED WITH YOUR NAUSEA MEDICATION  *UNUSUAL SHORTNESS OF BREATH  *UNUSUAL BRUISING OR BLEEDING  TENDERNESS IN MOUTH AND THROAT WITH OR WITHOUT PRESENCE OF ULCERS  *URINARY PROBLEMS  *BOWEL PROBLEMS  UNUSUAL RASH Items with * indicate a potential emergency and should be followed up as soon as possible.  Feel free to call the clinic you have any questions or concerns. The clinic phone number is [phone].

## 2014-10-25 DIAGNOSIS — C786 Secondary malignant neoplasm of retroperitoneum and peritoneum: Secondary | ICD-10-CM | POA: Insufficient documentation

## 2014-10-25 DIAGNOSIS — C801 Malignant (primary) neoplasm, unspecified: Secondary | ICD-10-CM

## 2014-10-26 ENCOUNTER — Telehealth: Payer: Self-pay | Admitting: *Deleted

## 2014-10-26 ENCOUNTER — Other Ambulatory Visit: Payer: Self-pay | Admitting: Oncology

## 2014-10-26 ENCOUNTER — Ambulatory Visit (HOSPITAL_COMMUNITY)
Admission: RE | Admit: 2014-10-26 | Discharge: 2014-10-26 | Disposition: A | Discharge status: Home / Self Care | Payer: Medicare Other | Source: Ambulatory Visit | Attending: Oncology | Admitting: Oncology

## 2014-10-26 ENCOUNTER — Telehealth: Payer: Self-pay | Admitting: Oncology

## 2014-10-26 DIAGNOSIS — C561 Malignant neoplasm of right ovary: Secondary | ICD-10-CM | POA: Insufficient documentation

## 2014-10-26 DIAGNOSIS — C562 Malignant neoplasm of left ovary: Secondary | ICD-10-CM

## 2014-10-26 MED ORDER — HEPARIN SOD (PORK) LOCK FLUSH 100 UNIT/ML IV SOLN
500.0000 [IU] | INTRAVENOUS | Status: AC | PRN
  Administered 2014-10-26: 500 [IU]
  Filled 2014-10-26: qty 5

## 2014-10-26 MED ORDER — SODIUM CHLORIDE 0.9 % IV SOLN
8.0000 mg | Freq: Once | INTRAVENOUS | Status: AC
  Administered 2014-10-26: 8 mg via INTRAVENOUS
  Filled 2014-10-26: qty 4

## 2014-10-26 MED ORDER — SODIUM CHLORIDE 0.9 % IV SOLN
1000.0000 mL | Freq: Once | INTRAVENOUS | Status: AC
  Administered 2014-10-26: 1000 mL via INTRAVENOUS

## 2014-10-26 MED ORDER — SODIUM CHLORIDE 0.9 % IJ SOLN
10.0000 mL | INTRAMUSCULAR | Status: AC | PRN
  Administered 2014-10-26: 10 mL

## 2014-10-26 NOTE — Procedures (Signed)
[facility]  Procedure Note  ZAINEB NOWACZYK ZOX:[id number] DOB: 1946-06-16 DOA: 10/26/2014   PCP: Dwan Bolt, MD   Associated Diagnosis: Ovarian carcinosarcoma, right (183.0)  Procedure Note: Infusion of IV fluids   Condition During Procedure:  Pt experienced nausea and vomiting; IV zofran given per order; patient experienced relief of symptoms and transfusion completed   Condition at Discharge:  Pt alert, oriented, ambulatory, accompanied by family; no complications noted   Nigel Sloop, [facility]

## 2014-10-26 NOTE — Telephone Encounter (Signed)
per pof to sch pt appt-cld & spoke to pt daughter per pt req to give appt to-gave pt times & dates for appt-traqcy understood

## 2014-10-26 NOTE — Telephone Encounter (Signed)
Per staff message and POF I have scheduled appts. Advised scheduler of appts. JMW  

## 2014-10-26 NOTE — Progress Notes (Signed)
Pt experiencing nausea and vomiting; Dr. Marko Plume notified; new orders received for IV zofran; will continue to monitor

## 2014-11-02 ENCOUNTER — Ambulatory Visit
Admission: RE | Admit: 2014-11-02 | Discharge: 2014-11-02 | Disposition: A | Discharge status: Home / Self Care | Payer: Medicare Other | Source: Ambulatory Visit | Attending: Radiation Oncology | Admitting: Radiation Oncology

## 2014-11-02 ENCOUNTER — Encounter: Payer: Self-pay | Admitting: Radiation Oncology

## 2014-11-02 ENCOUNTER — Other Ambulatory Visit: Payer: Self-pay | Admitting: Oncology

## 2014-11-02 VITALS — BP 133/67 | HR 87 | Resp 16 | Ht 67.0 in | Wt 179.8 lb

## 2014-11-02 DIAGNOSIS — C562 Malignant neoplasm of left ovary: Secondary | ICD-10-CM

## 2014-11-02 DIAGNOSIS — Z510 Encounter for antineoplastic radiation therapy: Secondary | ICD-10-CM | POA: Diagnosis not present

## 2014-11-02 DIAGNOSIS — Z933 Colostomy status: Secondary | ICD-10-CM | POA: Insufficient documentation

## 2014-11-02 DIAGNOSIS — Z79899 Other long term (current) drug therapy: Secondary | ICD-10-CM | POA: Insufficient documentation

## 2014-11-02 DIAGNOSIS — C801 Malignant (primary) neoplasm, unspecified: Secondary | ICD-10-CM | POA: Insufficient documentation

## 2014-11-02 DIAGNOSIS — C787 Secondary malignant neoplasm of liver and intrahepatic bile duct: Secondary | ICD-10-CM

## 2014-11-02 DIAGNOSIS — C786 Secondary malignant neoplasm of retroperitoneum and peritoneum: Secondary | ICD-10-CM | POA: Insufficient documentation

## 2014-11-02 NOTE — Progress Notes (Signed)
Radiation Oncology         [phone]) [phone] ________________________________  Initial outpatient Consultation - Date: 11/02/2014   Name: [name] MRN: [id number]   DOB: 04-13-1946  REFERRING PHYSICIAN: Gordy Levan, MD  DIAGNOSIS:    ICD-9-CM ICD-10-CM   1. 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 female  Diagnosed with a carcinosarcoma of the ovary. She was initially treated with debulking and rectosigmoid resection with diverting loop colostomy at [facility] in April. She has had progression on multiple rounds of chemotherapy. She was noted on recent imaging to have progression in the abdomen and pelvis (Today's study demonstrates significant progression of widespread metastatic disease in the abdomen and pelvis, with innumerable peritoneal implants, bulkiest in the low anatomic pelvis, widespread serosal implants on the surface of the spleen in the liver (bulkiest on the undersurface of the liver), and soft tissue implants extending into the patient's parastomal hernia associated with her left-sided colostomy).  She has also had increasing complaints of urinary frequency and incomplete empying. She does not have a UTI or kidney stone. She has to push some when she needs to urinate. She is also unable to sit down on her pelvis comfortably due to symptoms of fullness. She is on Duragesic and Dilaudid for pain which is well controlled. She has some nausea and decreased appetite. She has done well. She is accompanied by her sister in law.   PREVIOUS RADIATION THERAPY: No  FAMILY HISTORY:  Family History  Problem Relation Age of Onset  . Lung cancer Father 47  . Polycythemia Mother   . Leukemia Maternal Aunt   . Colon cancer Paternal Uncle   . Multiple myeloma Cousin     maternal first cousin  . Colon  cancer Cousin     paternal first cousin dx in her 48s    SOCIAL HISTORY:  History  Substance Use Topics  . Smoking status: Never Smoker   . Smokeless tobacco: Never Used  . Alcohol Use: No    REVIEW OF SYSTEMS:  A 15 point review of systems is documented in the electronic medical record. This was obtained by the nursing staff. However, I reviewed this with the patient to discuss relevant findings and make appropriate changes.  Pertinent positives are included in the chart.   PHYSICAL EXAM:  Filed Vitals:   11/02/14 0825  BP: 133/67  Pulse: 87  Resp: 16  .179 lb 12.8 oz (81.557 kg). Alert and oriented. Uncomfortable. Abdomen is distended but on tender to superficial palpation.   IMPRESSION: Metastatic ovarian cancer with urinary frequency/incomplete emptying/pelvic pain  PLAN:  We discussed palliation of symptoms and the role of radiation in local processes. From her imaging it would appear that her site of pain would be below the liver but the actual symptoms she is describing are in the pelvis where she also has significant progression. Typically ovarian cancers are sensitive to radiation, however, this aggressive phenotype maybe less so.  We discussed th her symptoms could temporarily worsen with radiation as the tumors completely encase the bladder and are close to small bowel. We discussed nausea, vomiting, fatigue and worsening urinary symptoms as the possible side effects of treatment acutely and then the rare long term effects of small bowel damage and obstruction. We were  able to fit her in for simulation today and she signed informed consent. This may delay her chemo by a week as she will be receiving 10 fractions starting on Monday and her next chemo was due on the 12th. I will ask Dr. Marko Plume to hold that if possible.   I spent 40 minutes  face to face with the patient and more than 50% of that time was spent in counseling and/or coordination of care.     ------------------------------------------------  Thea Silversmith, MD

## 2014-11-02 NOTE — Progress Notes (Addendum)
Patient reports taking flomax each night as directed. Reports occasional dysuria that has improved over the last week. Reports nocturia x1. Denies seeing blood in stool or urine lately. Last dose of Taxol and avastin done 10/24/2014. Requesting zofran refill. Reports persistent daily nausea. Reports vomiting on average twice per week. Reports decreased appetite. Reports weight gain related to tumor growth. Reports dilaudid and fentanyl manage abdominal pressure like pain well. Vitals stable. Left lower abdominal colostomy protruding more related to tumor growth. Reports stoma remains pink and moist. Denies history of radiation therapy. Here today with sister in law. Lives in [place].

## 2014-11-02 NOTE — Progress Notes (Signed)
See progress note under physician encounter. 

## 2014-11-02 NOTE — Progress Notes (Signed)
[facility] Simulation and Treatment Planning Note   Name: [name] MRN: [id number]  Date: 11/02/2014  DOB: 06-Sep-1946  Status: outpatient    DIAGNOSIS: The encounter diagnosis was Ovarian carcinosarcoma, left.    CONSENT VERIFIED: yes   SET UP AND IMMOBILIZATION: VacLoc immobilized the pelvis   NARRATIVE: The patient was brought to the [facility].  Identity was confirmed.  All relevant records and images related to the planned course of therapy were reviewed.  Then, the patient was positioned in a stable reproducible clinical set-up for radiation therapy.  CT images were obtained.  Skin markings were placed.  The CT images were loaded into the planning software where the target and avoidance structures were contoured.  The radiation prescription was entered and confirmed.   TREATMENT PLANNING NOTE:  Treatment planning then occurred. I have requested 3D simulation with [facility] of the spinal cord, bladder and gross tumor volume. I have also requested mlcs and an isodose plan.   A total of 5 complex treatment devices were utilized including MLCs and vac loc.

## 2014-11-03 ENCOUNTER — Telehealth: Payer: Self-pay | Admitting: Oncology

## 2014-11-03 ENCOUNTER — Encounter: Payer: Self-pay | Admitting: *Deleted

## 2014-11-03 NOTE — Telephone Encounter (Signed)
per pof to CX inf 1/14-sent Geralyn Corwin email to see if she will see pt that day anyway-will wait for reply b4 calling pt

## 2014-11-04 DIAGNOSIS — C801 Malignant (primary) neoplasm, unspecified: Secondary | ICD-10-CM | POA: Diagnosis not present

## 2014-11-04 DIAGNOSIS — Z79899 Other long term (current) drug therapy: Secondary | ICD-10-CM | POA: Diagnosis not present

## 2014-11-04 DIAGNOSIS — Z510 Encounter for antineoplastic radiation therapy: Secondary | ICD-10-CM | POA: Diagnosis not present

## 2014-11-04 DIAGNOSIS — C786 Secondary malignant neoplasm of retroperitoneum and peritoneum: Secondary | ICD-10-CM | POA: Diagnosis not present

## 2014-11-04 DIAGNOSIS — Z933 Colostomy status: Secondary | ICD-10-CM | POA: Diagnosis not present

## 2014-11-05 ENCOUNTER — Other Ambulatory Visit: Payer: Self-pay | Admitting: Oncology

## 2014-11-06 ENCOUNTER — Other Ambulatory Visit: Payer: Self-pay | Admitting: Oncology

## 2014-11-06 DIAGNOSIS — C801 Malignant (primary) neoplasm, unspecified: Principal | ICD-10-CM

## 2014-11-06 DIAGNOSIS — C786 Secondary malignant neoplasm of retroperitoneum and peritoneum: Secondary | ICD-10-CM

## 2014-11-06 DIAGNOSIS — C562 Malignant neoplasm of left ovary: Secondary | ICD-10-CM

## 2014-11-07 ENCOUNTER — Ambulatory Visit (HOSPITAL_BASED_OUTPATIENT_CLINIC_OR_DEPARTMENT_OTHER): Payer: 59 | Admitting: Nurse Practitioner

## 2014-11-07 ENCOUNTER — Encounter: Payer: Self-pay | Admitting: Nurse Practitioner

## 2014-11-07 ENCOUNTER — Ambulatory Visit: Payer: 59

## 2014-11-07 ENCOUNTER — Encounter (HOSPITAL_COMMUNITY): Payer: Self-pay | Admitting: *Deleted

## 2014-11-07 ENCOUNTER — Ambulatory Visit
Admission: RE | Admit: 2014-11-07 | Discharge: 2014-11-07 | Disposition: A | Discharge status: Home / Self Care | Payer: Medicare Other | Source: Ambulatory Visit | Attending: Radiation Oncology | Admitting: Radiation Oncology

## 2014-11-07 ENCOUNTER — Other Ambulatory Visit: Payer: Self-pay

## 2014-11-07 ENCOUNTER — Telehealth: Payer: Self-pay | Admitting: Nurse Practitioner

## 2014-11-07 ENCOUNTER — Encounter: Payer: Self-pay | Admitting: Radiation Oncology

## 2014-11-07 ENCOUNTER — Inpatient Hospital Stay (HOSPITAL_COMMUNITY)
Admission: AD | Admit: 2014-11-07 | Discharge: 2014-11-11 | DRG: 683 | Disposition: A | Discharge status: Hospice | Payer: Medicare Other | Source: Ambulatory Visit | Attending: Internal Medicine | Admitting: Internal Medicine

## 2014-11-07 ENCOUNTER — Ambulatory Visit: Admission: RE | Admit: 2014-11-07 | Payer: 59 | Source: Ambulatory Visit | Admitting: Radiation Oncology

## 2014-11-07 ENCOUNTER — Other Ambulatory Visit (HOSPITAL_BASED_OUTPATIENT_CLINIC_OR_DEPARTMENT_OTHER): Payer: 59 | Admitting: *Deleted

## 2014-11-07 ENCOUNTER — Telehealth: Payer: Self-pay | Admitting: Oncology

## 2014-11-07 ENCOUNTER — Telehealth: Payer: Self-pay

## 2014-11-07 ENCOUNTER — Other Ambulatory Visit: Payer: Self-pay | Admitting: *Deleted

## 2014-11-07 VITALS — BP 138/71 | HR 81 | Temp 97.6°F | Resp 17

## 2014-11-07 DIAGNOSIS — N132 Hydronephrosis with renal and ureteral calculous obstruction: Secondary | ICD-10-CM | POA: Diagnosis present

## 2014-11-07 DIAGNOSIS — C569 Malignant neoplasm of unspecified ovary: Secondary | ICD-10-CM

## 2014-11-07 DIAGNOSIS — Z933 Colostomy status: Secondary | ICD-10-CM

## 2014-11-07 DIAGNOSIS — C561 Malignant neoplasm of right ovary: Secondary | ICD-10-CM | POA: Diagnosis not present

## 2014-11-07 DIAGNOSIS — M1008 Idiopathic gout, vertebrae: Secondary | ICD-10-CM

## 2014-11-07 DIAGNOSIS — I10 Essential (primary) hypertension: Secondary | ICD-10-CM | POA: Diagnosis not present

## 2014-11-07 DIAGNOSIS — D500 Iron deficiency anemia secondary to blood loss (chronic): Secondary | ICD-10-CM | POA: Diagnosis present

## 2014-11-07 DIAGNOSIS — N138 Other obstructive and reflux uropathy: Secondary | ICD-10-CM | POA: Diagnosis present

## 2014-11-07 DIAGNOSIS — G893 Neoplasm related pain (acute) (chronic): Secondary | ICD-10-CM

## 2014-11-07 DIAGNOSIS — R319 Hematuria, unspecified: Secondary | ICD-10-CM

## 2014-11-07 DIAGNOSIS — C801 Malignant (primary) neoplasm, unspecified: Secondary | ICD-10-CM | POA: Diagnosis not present

## 2014-11-07 DIAGNOSIS — Z79899 Other long term (current) drug therapy: Secondary | ICD-10-CM | POA: Diagnosis not present

## 2014-11-07 DIAGNOSIS — C7989 Secondary malignant neoplasm of other specified sites: Secondary | ICD-10-CM | POA: Diagnosis not present

## 2014-11-07 DIAGNOSIS — C786 Secondary malignant neoplasm of retroperitoneum and peritoneum: Secondary | ICD-10-CM

## 2014-11-07 DIAGNOSIS — Z91040 Latex allergy status: Secondary | ICD-10-CM | POA: Diagnosis not present

## 2014-11-07 DIAGNOSIS — Z881 Allergy status to other antibiotic agents status: Secondary | ICD-10-CM

## 2014-11-07 DIAGNOSIS — K219 Gastro-esophageal reflux disease without esophagitis: Secondary | ICD-10-CM | POA: Diagnosis not present

## 2014-11-07 DIAGNOSIS — Z883 Allergy status to other anti-infective agents status: Secondary | ICD-10-CM | POA: Diagnosis not present

## 2014-11-07 DIAGNOSIS — Z888 Allergy status to other drugs, medicaments and biological substances status: Secondary | ICD-10-CM

## 2014-11-07 DIAGNOSIS — Z66 Do not resuscitate: Secondary | ICD-10-CM | POA: Diagnosis not present

## 2014-11-07 DIAGNOSIS — Z801 Family history of malignant neoplasm of trachea, bronchus and lung: Secondary | ICD-10-CM

## 2014-11-07 DIAGNOSIS — J45909 Unspecified asthma, uncomplicated: Secondary | ICD-10-CM | POA: Diagnosis not present

## 2014-11-07 DIAGNOSIS — Z882 Allergy status to sulfonamides status: Secondary | ICD-10-CM | POA: Diagnosis not present

## 2014-11-07 DIAGNOSIS — D6481 Anemia due to antineoplastic chemotherapy: Secondary | ICD-10-CM | POA: Diagnosis present

## 2014-11-07 DIAGNOSIS — R112 Nausea with vomiting, unspecified: Secondary | ICD-10-CM

## 2014-11-07 DIAGNOSIS — Z8542 Personal history of malignant neoplasm of other parts of uterus: Secondary | ICD-10-CM | POA: Diagnosis not present

## 2014-11-07 DIAGNOSIS — F419 Anxiety disorder, unspecified: Secondary | ICD-10-CM | POA: Diagnosis present

## 2014-11-07 DIAGNOSIS — N179 Acute kidney failure, unspecified: Secondary | ICD-10-CM | POA: Diagnosis not present

## 2014-11-07 DIAGNOSIS — E039 Hypothyroidism, unspecified: Secondary | ICD-10-CM | POA: Diagnosis present

## 2014-11-07 DIAGNOSIS — D630 Anemia in neoplastic disease: Secondary | ICD-10-CM | POA: Diagnosis not present

## 2014-11-07 DIAGNOSIS — N200 Calculus of kidney: Secondary | ICD-10-CM | POA: Diagnosis present

## 2014-11-07 DIAGNOSIS — Z515 Encounter for palliative care: Secondary | ICD-10-CM | POA: Diagnosis not present

## 2014-11-07 DIAGNOSIS — Z0389 Encounter for observation for other suspected diseases and conditions ruled out: Secondary | ICD-10-CM | POA: Diagnosis not present

## 2014-11-07 DIAGNOSIS — Z90710 Acquired absence of both cervix and uterus: Secondary | ICD-10-CM

## 2014-11-07 DIAGNOSIS — Z885 Allergy status to narcotic agent status: Secondary | ICD-10-CM | POA: Diagnosis not present

## 2014-11-07 DIAGNOSIS — Z9049 Acquired absence of other specified parts of digestive tract: Secondary | ICD-10-CM | POA: Diagnosis present

## 2014-11-07 DIAGNOSIS — F329 Major depressive disorder, single episode, unspecified: Secondary | ICD-10-CM | POA: Diagnosis present

## 2014-11-07 DIAGNOSIS — R110 Nausea: Secondary | ICD-10-CM

## 2014-11-07 DIAGNOSIS — Z510 Encounter for antineoplastic radiation therapy: Secondary | ICD-10-CM | POA: Diagnosis not present

## 2014-11-07 DIAGNOSIS — N289 Disorder of kidney and ureter, unspecified: Secondary | ICD-10-CM

## 2014-11-07 DIAGNOSIS — R310 Gross hematuria: Secondary | ICD-10-CM | POA: Diagnosis not present

## 2014-11-07 DIAGNOSIS — K56609 Unspecified intestinal obstruction, unspecified as to partial versus complete obstruction: Secondary | ICD-10-CM

## 2014-11-07 DIAGNOSIS — E860 Dehydration: Secondary | ICD-10-CM

## 2014-11-07 DIAGNOSIS — E8809 Other disorders of plasma-protein metabolism, not elsewhere classified: Secondary | ICD-10-CM

## 2014-11-07 DIAGNOSIS — R1110 Vomiting, unspecified: Secondary | ICD-10-CM

## 2014-11-07 LAB — COMPREHENSIVE METABOLIC PANEL (CC13)
ALT: 10 U/L (ref 0–55)
ANION GAP: 18 meq/L — AB (ref 3–11)
AST: 23 U/L (ref 5–34)
Albumin: 3 g/dL — ABNORMAL LOW (ref 3.5–5.0)
Alkaline Phosphatase: 73 U/L (ref 40–150)
BUN: 45 mg/dL — ABNORMAL HIGH (ref 7.0–26.0)
CO2: 24 meq/L (ref 22–29)
CREATININE: 3.8 mg/dL — AB (ref 0.6–1.1)
Calcium: 9 mg/dL (ref 8.4–10.4)
Chloride: 99 mEq/L (ref 98–109)
EGFR: 11 mL/min/{1.73_m2} — AB (ref >90)
Glucose: 101 mg/dl (ref 70–140)
Potassium: 4 mEq/L (ref 3.5–5.1)
SODIUM: 141 meq/L (ref 136–145)
Total Bilirubin: 0.42 mg/dL (ref 0.20–1.20)
Total Protein: 7.5 g/dL (ref 6.4–8.3)

## 2014-11-07 LAB — CBC WITH DIFFERENTIAL/PLATELET
BASO%: 0.3 % (ref 0.0–2.0)
BASOS ABS: 0 10*3/uL (ref 0.0–0.1)
EOS%: 0 % (ref 0.0–7.0)
Eosinophils Absolute: 0 10*3/uL (ref 0.0–0.5)
HCT: 29.7 % — ABNORMAL LOW (ref 34.8–46.6)
HGB: 9.6 g/dL — ABNORMAL LOW (ref 11.6–15.9)
LYMPH%: 2.3 % — ABNORMAL LOW (ref 14.0–49.7)
MCH: 28.4 pg (ref 25.1–34.0)
MCHC: 32.2 g/dL (ref 31.5–36.0)
MCV: 88 fL (ref 79.5–101.0)
MONO#: 0.5 10*3/uL (ref 0.1–0.9)
MONO%: 5.1 % (ref 0.0–14.0)
NEUT#: 8.2 10*3/uL — ABNORMAL HIGH (ref 1.5–6.5)
NEUT%: 92.3 % — ABNORMAL HIGH (ref 38.4–76.8)
Platelets: 312 10*3/uL (ref 145–400)
RBC: 3.37 10*6/uL — AB (ref 3.70–5.45)
RDW: 15.2 % — AB (ref 11.2–14.5)
WBC: 8.9 10*3/uL (ref 3.9–10.3)
lymph#: 0.2 10*3/uL — ABNORMAL LOW (ref 0.9–3.3)

## 2014-11-07 LAB — URINALYSIS, MICROSCOPIC - CHCC
Glucose: NEGATIVE mg/dL
KETONES: NEGATIVE mg/dL
SPECIFIC GRAVITY, URINE: 1.02 (ref 1.003–1.035)

## 2014-11-07 MED ORDER — ALLOPURINOL 100 MG PO TABS
200.0000 mg | ORAL_TABLET | Freq: Every day | ORAL | Status: DC
  Administered 2014-11-08: 200 mg via ORAL
  Filled 2014-11-07 (×2): qty 2

## 2014-11-07 MED ORDER — FAMOTIDINE IN NACL 20-0.9 MG/50ML-% IV SOLN
20.0000 mg | INTRAVENOUS | Status: DC
  Administered 2014-11-07 – 2014-11-10 (×4): 20 mg via INTRAVENOUS
  Filled 2014-11-07 (×4): qty 50

## 2014-11-07 MED ORDER — SODIUM CHLORIDE 0.9 % IJ SOLN
10.0000 mL | INTRAMUSCULAR | Status: DC | PRN
Start: 2014-11-07 — End: 2014-11-08
  Administered 2014-11-07: 10 mL via INTRAVENOUS
  Filled 2014-11-07: qty 10

## 2014-11-07 MED ORDER — ACETAMINOPHEN 650 MG RE SUPP: 650.0000 mg | Freq: Four times a day (QID) | RECTAL | Status: DC | PRN

## 2014-11-07 MED ORDER — HYDROMORPHONE HCL 4 MG/ML IJ SOLN
2.0000 mg | Freq: Once | INTRAMUSCULAR | Status: AC
  Administered 2014-11-07: 2 mg via INTRAVENOUS

## 2014-11-07 MED ORDER — SODIUM CHLORIDE 0.9 % IV SOLN
INTRAVENOUS | Status: DC
  Administered 2014-11-07 – 2014-11-09 (×3): via INTRAVENOUS

## 2014-11-07 MED ORDER — ONDANSETRON 8 MG/NS 50 ML IVPB
INTRAVENOUS | Status: AC
  Filled 2014-11-07: qty 8

## 2014-11-07 MED ORDER — ACETAMINOPHEN 325 MG PO TABS: 650.0000 mg | ORAL_TABLET | Freq: Four times a day (QID) | ORAL | Status: DC | PRN

## 2014-11-07 MED ORDER — LORAZEPAM 2 MG/ML IJ SOLN
0.5000 mg | INTRAMUSCULAR | Status: DC | PRN
  Administered 2014-11-08 – 2014-11-09 (×3): 1 mg via INTRAVENOUS
  Filled 2014-11-07 (×3): qty 1

## 2014-11-07 MED ORDER — HEPARIN SODIUM (PORCINE) 5000 UNIT/ML IJ SOLN
5000.0000 [IU] | Freq: Three times a day (TID) | INTRAMUSCULAR | Status: DC
  Administered 2014-11-07 – 2014-11-08 (×3): 5000 [IU] via SUBCUTANEOUS
  Filled 2014-11-07 (×8): qty 1

## 2014-11-07 MED ORDER — HEPARIN SOD (PORK) LOCK FLUSH 100 UNIT/ML IV SOLN
500.0000 [IU] | Freq: Once | INTRAVENOUS | Status: AC
  Administered 2014-11-07: 500 [IU] via INTRAVENOUS
  Filled 2014-11-07: qty 5

## 2014-11-07 MED ORDER — SODIUM CHLORIDE 0.9 % IV SOLN
INTRAVENOUS | Status: AC
  Administered 2014-11-07: 15:00:00 via INTRAVENOUS

## 2014-11-07 MED ORDER — SODIUM CHLORIDE 0.9 % IV SOLN
INTRAVENOUS | Status: DC
  Administered 2014-11-07: 20:00:00 via INTRAVENOUS

## 2014-11-07 MED ORDER — TAMSULOSIN HCL 0.4 MG PO CAPS
0.4000 mg | ORAL_CAPSULE | Freq: Every day | ORAL | Status: DC
  Administered 2014-11-08 – 2014-11-09 (×2): 0.4 mg via ORAL
  Filled 2014-11-07 (×3): qty 1

## 2014-11-07 MED ORDER — LORAZEPAM 2 MG/ML IJ SOLN
INTRAMUSCULAR | Status: AC
  Filled 2014-11-07: qty 1

## 2014-11-07 MED ORDER — ONDANSETRON 8 MG/50ML IVPB (CHCC)
8.0000 mg | Freq: Once | INTRAVENOUS | Status: AC
  Administered 2014-11-07: 8 mg via INTRAVENOUS

## 2014-11-07 MED ORDER — FENTANYL 25 MCG/HR TD PT72: 25.0000 ug | MEDICATED_PATCH | TRANSDERMAL | Status: DC

## 2014-11-07 MED ORDER — HYDROMORPHONE HCL 4 MG/ML IJ SOLN
INTRAMUSCULAR | Status: AC
  Filled 2014-11-07: qty 1

## 2014-11-07 MED ORDER — ONDANSETRON 8 MG/NS 50 ML IVPB
8.0000 mg | Freq: Three times a day (TID) | INTRAVENOUS | Status: DC | PRN
  Administered 2014-11-08: 8 mg via INTRAVENOUS
  Filled 2014-11-07 (×2): qty 8

## 2014-11-07 MED ORDER — LORAZEPAM 2 MG/ML IJ SOLN
0.5000 mg | Freq: Once | INTRAMUSCULAR | Status: AC
  Administered 2014-11-07: 0.5 mg via INTRAVENOUS

## 2014-11-07 MED ORDER — PROMETHAZINE HCL 25 MG RE SUPP: 25.0000 mg | Freq: Four times a day (QID) | RECTAL | Status: DC | PRN

## 2014-11-07 MED ORDER — HYDROMORPHONE HCL 1 MG/ML IJ SOLN: 1.0000 mg | INTRAMUSCULAR | Status: DC | PRN

## 2014-11-07 MED ORDER — LEVOTHYROXINE SODIUM 75 MCG PO TABS
75.0000 ug | ORAL_TABLET | Freq: Every day | ORAL | Status: DC
Start: 2014-11-08 — End: 2014-11-10
  Administered 2014-11-08 – 2014-11-09 (×2): 75 ug via ORAL
  Filled 2014-11-07 (×4): qty 1

## 2014-11-07 MED ORDER — HYDROMORPHONE HCL 1 MG/ML IJ SOLN
1.0000 mg | INTRAMUSCULAR | Status: DC | PRN
  Administered 2014-11-08 (×2): 2 mg via INTRAVENOUS
  Administered 2014-11-08: 1 mg via INTRAVENOUS
  Administered 2014-11-08 – 2014-11-09 (×3): 2 mg via INTRAVENOUS
  Administered 2014-11-09 – 2014-11-10 (×4): 1 mg via INTRAVENOUS
  Administered 2014-11-10: 2 mg via INTRAVENOUS
  Filled 2014-11-07: qty 2
  Filled 2014-11-07: qty 1
  Filled 2014-11-07 (×4): qty 2
  Filled 2014-11-07 (×4): qty 1
  Filled 2014-11-07 (×2): qty 2

## 2014-11-07 NOTE — Assessment & Plan Note (Signed)
Patient received cycle 2 of her Doxil/Avastin chemotherapy regimen on 10/24/2014.  She also initiated radiation treatments today as well.  Patient is scheduled to return for cycle 3 of the same chemotherapy regimen on 11/14/2014.

## 2014-11-07 NOTE — Telephone Encounter (Signed)
Told daughter about the chemo being held as noted below by Dr. Marko Plume as Ms. Heitman's voicemail not set up on her phone and no answer.

## 2014-11-07 NOTE — Telephone Encounter (Signed)
Labs/ov per 01/11 POF scheduled with NP/CB

## 2014-11-07 NOTE — Progress Notes (Signed)
Patient brought to nursing after port and treat today with initial concern of visible blood in urine.While getting vitals patient started having intractable nausea and vomiting of dark yellow to light green emesis.complaint of lower pelvic pain and pressure "4' scale of 0 to 10 which is dull and constant.she hasn't had much out of colostomy but appetite has been poor..Stopped taking duragesic patch and is now taking dilaudid 2 mg.Last took zofran and pain med at 8:00 am.Informed Dr.Squire of patient's symptoms and we both felt she would be better assessed by symptom management nurse to expedite her care.[name] is able to see patient  right now.[name] for Dr.Livesay was informed of patient status as she had called earlier and spoke with our per diem nurse [name] to let us know patient needed to be seen by someone today.Patient was taken to med/onc registration via wheelchair and added onto Ms.Bacon's schedule.we will see patient on tomorrow for treatment by Dr.Kinard as Dr.Wentworth is out of office this week.

## 2014-11-07 NOTE — Telephone Encounter (Signed)
per pof to sch pt appt-cld pt daugher per pt req and adv of pt appt on 1/14-daughter understood

## 2014-11-07 NOTE — Telephone Encounter (Signed)
-----   Message from Gordy Levan, MD sent at 11/02/2014  5:47 PM EST ----- Need to hold chemo 1-14 due to RT starting. I sent POF to schedulers - please RN let patient know if schedulers are delayed. thanks

## 2014-11-07 NOTE — Assessment & Plan Note (Signed)
Patient has been experiencing significant irretractable nausea and vomiting since yesterday.  She has been trying to take Zofran at home with minimal effectiveness.  Patient was actively vomiting frequently while at the [place] [facility] today.  Patient was given Zofran 8 mg IV which did help relieve the nausea and vomiting somewhat.

## 2014-11-07 NOTE — Assessment & Plan Note (Signed)
Albumin decreased from 3.5 down to 3.0.  Patient was encouraged to push protein in her diet is much as possible.

## 2014-11-07 NOTE — Progress Notes (Signed)
Pt arrived from [facility], report called from [place] NP.  Pt assessed, VS taken, oriented to room.  Dr. Aileen Fass paged and made aware of arrival.  Awaiting MD to see and awaiting orders.

## 2014-11-07 NOTE — Assessment & Plan Note (Signed)
Patient has had severe nausea and very frequent vomiting for the past 24 hours.  Patient has tried Zofran with only minimal effectiveness.  Patient has also been given Ativan 0.5 mg IV as well.  Of note-patient's colostomy has had minimal to no output for approximately 24 hours.  Patient with minimal bowel sounds on auscultation.  Patient's stoma is herniated at baseline.  However, patient now states that the stoma is protruding much more than previously noted.  Stoma remains pink and moist.  No stool in bag on exam.

## 2014-11-07 NOTE — Progress Notes (Signed)
will   SYMPTOM MANAGEMENT CLINIC   HPI: [name] 69 y.o. female diagnosed with progressive uterine carcinosarcoma.  Currently undergoing Doxil/Avastin chemotherapy regimen.  Patient initiated radiation therapy today as well.  Patient called the cancer Center today requesting urgent care visit.  Patient received cycle 2 of her Doxil/Avastin chemotherapy regimen on 10/24/2014.  She initiated radiation treatments today as well.  She reports inability to urinate for approximately 3 days over this past weekend.  She was able to urinate upon awakening this morning; has noted gross hematuria since.  She is also been experiencing severe nausea and vomiting for the past 24 hours.  She has tried Zofran at home with minimal effectiveness.  She feels very dehydrated today.  She is also complaining of chronic low abdominal and low back pain she removed her fentanyl patches; stating that she thought they may be making her more nauseated.  She continues to take Dilaudid on a regular basis for pain control.  Patient has a history of a chronic colostomy stoma hernia.  Patient states that the stoma hernia is now protruding much more than is her baseline.  She is also noted no output to her colostomy for the past 24 hours.  She denies any recent fevers or chills.   HPI  CURRENT THERAPY: Upcoming Treatment Dates - OVARIAN Liposomal Doxorubicin q21d Days with orders from any treatment category:  11/14/2014      SCHEDULING COMMUNICATION      ondansetron (ZOFRAN) IVPB 8 mg      dexamethasone (DECADRON) injection 10 mg      DOXOrubicin HCL LIPOSOMAL (DOXIL) 80 mg in dextrose 5 % 250 mL chemo infusion      sodium chloride 0.9 % injection 10 mL      heparin lock flush 100 unit/mL      heparin lock flush 100 unit/mL      alteplase (CATHFLO ACTIVASE) injection 2 mg      sodium chloride 0.9 % injection 3 mL      Cold Pack 1 packet      0.9 %  sodium chloride infusion      TREATMENT CONDITIONS 12/05/2014  SCHEDULING COMMUNICATION      ondansetron (ZOFRAN) IVPB 8 mg      dexamethasone (DECADRON) injection 10 mg      DOXOrubicin HCL LIPOSOMAL (DOXIL) 80 mg in dextrose 5 % 250 mL chemo infusion      sodium chloride 0.9 % injection 10 mL      heparin lock flush 100 unit/mL      heparin lock flush 100 unit/mL      alteplase (CATHFLO ACTIVASE) injection 2 mg      sodium chloride 0.9 % injection 3 mL      Cold Pack 1 packet      0.9 %  sodium chloride infusion      TREATMENT CONDITIONS 12/26/2014      SCHEDULING COMMUNICATION      ondansetron (ZOFRAN) IVPB 8 mg      dexamethasone (DECADRON) injection 10 mg      DOXOrubicin HCL LIPOSOMAL (DOXIL) 80 mg in dextrose 5 % 250 mL chemo infusion      sodium chloride 0.9 % injection 10 mL      heparin lock flush 100 unit/mL      heparin lock flush 100 unit/mL      alteplase (CATHFLO ACTIVASE) injection 2 mg      sodium chloride 0.9 % injection 3 mL  Cold Pack 1 packet      0.9 %  sodium chloride infusion      TREATMENT CONDITIONS    ROS  Past Medical History  Diagnosis Date  . Thyroid disease   . Hypertension   . Anemia   . Asthma   . Hiatal hernia   . Cancer 02/2014    ovarian ca  . Renal disorder     Past Surgical History  Procedure Laterality Date  . Tonsillectomy    . Thyroidectomy, partial    . Colostomy    . Abdominal hysterectomy      partial; still has ovaries  . Cholecystectomy N/A 11/22/2013    Procedure: LAPAROSCOPIC CHOLECYSTECTOMY WITH INTRAOPERATIVE CHOLANGIOGRAM;  Surgeon: Gwenyth Ober, MD;  Location: [place];  Service: General;  Laterality: N/A;    has Acute cholecystitis; Abdominal mass; Essential hypertension, benign; Hypothyroidism; GERD (gastroesophageal reflux disease); Hx laparoscopic cholecystectomy; Ovarian carcinosarcoma; BP (high blood pressure); Genetic testing; Uric acid nephrolithiasis; Cancer associated pain; Peritoneal carcinomatosis; Nausea with vomiting; Dehydration; Hematuria; Renal insufficiency;  and Hypoalbuminemia on her problem list.     is allergic to epinephrine; codeine; demerol; latex; macrodantin; micardis; protonix; and sulfa antibiotics.    Medication List       This list is accurate as of: 11/07/14  5:51 PM.  Always use your most recent med list.               allopurinol 100 MG tablet  Commonly known as:  ZYLOPRIM  Take 2 tablets (200 mg total) by mouth daily.     amLODipine 5 MG tablet  Commonly known as:  NORVASC  Take 5 mg by mouth daily.     BIOTIN PO  Take 1 tablet by mouth daily.     fentaNYL 25 MCG/HR patch  Commonly known as:  DURAGESIC - dosed mcg/hr  Place 1 patch (25 mcg total) onto the skin every 3 (three) days.     FLOMAX 0.4 MG Caps capsule  Generic drug:  tamsulosin  Take 0.4 mg by mouth daily.     HYDROmorphone 2 MG tablet  Commonly known as:  DILAUDID  Take 1- 2 tablets by mouth every 4-6 hours as needed for pain     levothyroxine 75 MCG tablet  Commonly known as:  SYNTHROID, LEVOTHROID  Take 75 mcg by mouth daily before breakfast.     lidocaine-prilocaine cream  Commonly known as:  EMLA  Apply 1 application topically as needed.     loratadine 10 MG tablet  Commonly known as:  CLARITIN  Take 10 mg by mouth daily as needed for allergies.     metoprolol succinate 50 MG 24 hr tablet  Commonly known as:  TOPROL-XL  Take 50 mg by mouth daily. Take with or immediately following a meal.     multivitamin with minerals Tabs tablet  Take 1 tablet by mouth daily.     ondansetron 8 MG tablet  Commonly known as:  ZOFRAN  TAKE 1 TABLET THE MORNING AFTER CHEMO, THEN TAKE 1 EVERY 8 HOURS AS NEEDED FOR NAUSEA/VOMITING     OVER THE COUNTER MEDICATION  Place 1 each into both eyes 2 (two) times daily. Pure Moist.     promethazine 25 MG suppository  Commonly known as:  PHENERGAN  Place 1 suppository (25 mg total) rectally every 6 (six) hours as needed for nausea or vomiting.     promethazine 25 MG tablet  Commonly known as:  PHENERGAN   Take 0.5-1 tablets (  12.5-25 mg total) by mouth every 6 (six) hours as needed for nausea or vomiting.     ranitidine 75 MG tablet  Commonly known as:  ZANTAC  Take 75 mg by mouth daily as needed.         PHYSICAL EXAMINATION  Blood pressure 138/71, pulse 81, temperature 97.6 F (36.4 C), temperature source Oral, resp. rate 17, SpO2 97 %.  Physical Exam  Constitutional: She is oriented to person, place, and time. Vital signs are normal. She appears malnourished and dehydrated. She appears unhealthy. She appears cachectic. She has a sickly appearance. She appears distressed.  HENT:  Head: Normocephalic and atraumatic.  Mouth/Throat: Oropharynx is clear and moist.  Eyes: Conjunctivae and EOM are normal. Pupils are equal, round, and reactive to light. Right eye exhibits no discharge. Left eye exhibits no discharge. No scleral icterus.  Neck: Normal range of motion. Neck supple. No JVD present. No tracheal deviation present. No thyromegaly present.  Cardiovascular: Normal rate, regular rhythm, normal heart sounds and intact distal pulses.   Pulmonary/Chest: Effort normal and breath sounds normal. No respiratory distress. She has no wheezes. She has no rales. She exhibits no tenderness.  Abdominal: Soft. She exhibits mass. She exhibits no distension and no ascites. Bowel sounds are hypoactive. There is generalized tenderness and tenderness in the suprapubic area. There is CVA tenderness. There is no rigidity, no rebound, no guarding, no tenderness at McBurney's point and negative Murphy's sign. A hernia is present.  Musculoskeletal: Normal range of motion. She exhibits no edema or tenderness.  Lymphadenopathy:    She has no cervical adenopathy.  Neurological: She is alert and oriented to person, place, and time.  Skin: Skin is warm and dry. No rash noted. No erythema. There is pallor.  Psychiatric: Affect normal.  Nursing note and vitals reviewed.   LABORATORY DATA:. Orders Only on  11/07/2014  Component Date Value Ref Range Status  . Sodium 11/07/2014 141  136 - 145 mEq/L Final  . Potassium 11/07/2014 4.0  3.5 - 5.1 mEq/L Final  . Chloride 11/07/2014 99  98 - 109 mEq/L Final  . CO2 11/07/2014 24  22 - 29 mEq/L Final  . Glucose 11/07/2014 101  70 - 140 mg/dl Final  . BUN 11/07/2014 45.0* 7.0 - 26.0 mg/dL Final  . Creatinine 11/07/2014 3.8* 0.6 - 1.1 mg/dL Final  . Total Bilirubin 11/07/2014 0.42  0.20 - 1.20 mg/dL Final  . Alkaline Phosphatase 11/07/2014 73  40 - 150 U/L Final  . AST 11/07/2014 23  5 - 34 U/L Final  . ALT 11/07/2014 10  0 - 55 U/L Final  . Total Protein 11/07/2014 7.5  6.4 - 8.3 g/dL Final  . Albumin 11/07/2014 3.0* 3.5 - 5.0 g/dL Final  . Calcium 11/07/2014 9.0  8.4 - 10.4 mg/dL Final  . Anion Gap 11/07/2014 18* 3 - 11 mEq/L Final  . EGFR 11/07/2014 11* >90 ml/min/1.73 m2 Final   eGFR is calculated using the CKD-EPI Creatinine Equation (2009)  . WBC 11/07/2014 8.9  3.9 - 10.3 10e3/uL Final  . NEUT# 11/07/2014 8.2* 1.5 - 6.5 10e3/uL Final  . HGB 11/07/2014 9.6* 11.6 - 15.9 g/dL Final  . HCT 11/07/2014 29.7* 34.8 - 46.6 % Final  . Platelets 11/07/2014 312  145 - 400 10e3/uL Final  . MCV 11/07/2014 88.0  79.5 - 101.0 fL Final  . MCH 11/07/2014 28.4  25.1 - 34.0 pg Final  . MCHC 11/07/2014 32.2  31.5 - 36.0 g/dL Final  .  RBC 11/07/2014 3.37* 3.70 - 5.45 10e6/uL Final  . RDW 11/07/2014 15.2* 11.2 - 14.5 % Final  . lymph# 11/07/2014 0.2* 0.9 - 3.3 10e3/uL Final  . MONO# 11/07/2014 0.5  0.1 - 0.9 10e3/uL Final  . Eosinophils Absolute 11/07/2014 0.0  0.0 - 0.5 10e3/uL Final  . Basophils Absolute 11/07/2014 0.0  0.0 - 0.1 10e3/uL Final  . NEUT% 11/07/2014 92.3* 38.4 - 76.8 % Final  . LYMPH% 11/07/2014 2.3* 14.0 - 49.7 % Final  . MONO% 11/07/2014 5.1  0.0 - 14.0 % Final  . EOS% 11/07/2014 0.0  0.0 - 7.0 % Final  . BASO% 11/07/2014 0.3  0.0 - 2.0 % Final  Orders Only on 11/07/2014  Component Date Value Ref Range Status  . Glucose 11/07/2014  Negative  Negative mg/dL Final  . Bilirubin (Urine) 11/07/2014 Color Interference  Negative Final  . Ketones 11/07/2014 Negative  Negative mg/dL Final  . Specific Gravity, Urine 11/07/2014 1.020  1.003 - 1.035 Final  . Blood 11/07/2014 Large  Negative Final  . pH 11/07/2014 Color Interference  4.6 - 8.0 Final  . Protein 11/07/2014 Color Interference  Negative- <30 mg/dL Final  . Urobilinogen, UR 11/07/2014 Color Interference  0.2 - 1 mg/dL Final  . Nitrite 11/07/2014 Color Interference  Negative Final  . Leukocyte Esterase 11/07/2014 Moderate  Negative Final  . RBC / HPF 11/07/2014 TNTC  0 - 2 Final  . WBC, UA 11/07/2014 Field Obscured by RBCs  0 - 2 Final  . Bacteria, UA 11/07/2014 Field obscured by RBCs  Negative- Trace Final     RADIOGRAPHIC STUDIES: No results found.  ASSESSMENT/PLAN:    Cancer associated pain Patient complaining of cancer-related chronic pain to her lower abdomen and low back.  Patient states she discontinued her signal patch within the past several day; stating that she felt the signal patch was created been to her nausea.  Patient continues to take Dilaudid orally as directed.  Patient was also given Dilaudid 2 mg IV while at the [place] today.  Dehydration Patient has been experiencing significant irretractable nausea and vomiting since yesterday.  She has been trying to take Zofran at home with minimal effectiveness.  Patient was actively vomiting frequently while at the [place] today.  Patient was given Zofran 8 mg IV which did help relieve the nausea and vomiting somewhat.  Hematuria Patient reports having difficulty urinating for the past 3 days.  This morning she began to urinate again; but is now having gross hematuria.  Urinalysis obtained today did verify gross hematuria and moderate leukocytes.  Most likely, hematuria is related to large tumor burden surrounding the bladder and ureters.  Hypoalbuminemia Albumin decreased from 3.5 down to  3.0.  Patient was encouraged to push protein in her diet is much as possible.  Nausea with vomiting Patient has had severe nausea and very frequent vomiting for the past 24 hours.  Patient has tried Zofran with only minimal effectiveness.  Patient has also been given Ativan 0.5 mg IV as well.  Of note-patient's colostomy has had minimal to no output for approximately 24 hours.  Patient with minimal bowel sounds on auscultation.  Patient's stoma is herniated at baseline.  However, patient now states that the stoma is protruding much more than previously noted.  Stoma remains pink and moist.  No stool in bag on exam.      Ovarian carcinosarcoma Patient received cycle 2 of her Doxil/Avastin chemotherapy regimen on 10/24/2014.  She also initiated radiation treatments  today as well.  Patient is scheduled to return for cycle 3 of the same chemotherapy regimen on 11/14/2014.  Renal insufficiency Patient's creatinine has increased to 3.8.  Most likely, this is secondary to severe dehydration.  Patient stated understanding of all instructions; and was in agreement with this plan of care. The patient knows to call the clinic with any problems, questions or concerns.   Reviewed all with Dr. Marko Plume prior to direct admission to hospital.   Total time spent with patient was 40 minutes;  with greater than 75 percent of that time spent in face to face counseling regarding her symptoms, frequent monitoring of patient fall at the [place], and coordination of care and follow up.  Disclaimer: This note was dictated with voice recognition software. Similar sounding words can inadvertently be transcribed and may not be corrected upon review.   Drue Second, NP 11/07/2014

## 2014-11-07 NOTE — Progress Notes (Signed)
11/07/2014, 6:49 PM  Hospital day 1 Antibiotics:  Chemotherapy: #2 doxil 10-24-14; last avastin 10-24-14  Outpatient Physicians: Nancy Marus, Evlyn Clines, Paulette Blanch (PCP), Laurence Spates, Judeth Horn, Ron Davis/ Louis Meckel  Patient seen in office with APP as work in today, and on [place] now with daughter and sister in law at bedside. Discussed with unit RN and with admitting hospitalist physician on unit.  Patient has extensively metastatic uterine carcinosarcoma, with most recent CT AP 10-19-14 showing extensive involvement around liver, in pelvis and in parastomal hernia adjacent to colostomy. She has clinically worsened despite 2 cycles of doxil thru 10-24-14 with avastin, with increased pain in pelvis and abdomen, nausea and vomiting, no output from ostomy in ~ 2 days and difficulty with urination as well as frank hematuria today. She has had no significant po intake in several days. She stopped duragesic patch on 11-05-14 as she thought this was causing symptoms, tho she had been more comfortable with patch. Radiation to pelvis in palliative attempt was begun today by Dr Pablo Ledger. Patient told me today that she does not want further doxil, and she does not want resuscitation or life support. Family aware of these requests per my discussion with them now, and agree. She was seen by medical oncology after radiation treatment today, with dry heaves and obviously dehydrated, and urine dark red. She was given IV NS, IV zofran, IV dilaudid and ativan prior to bed available in hospital.  ONCOLOGIC HISTORY Patient had been in usual generally good health, status post remote hysterectomy, until abdominal pain with radiation to right shoulder in Jan 2015. With [place] evidence of cholelithiasis, she had laparoscopic cholecystectomy by Dr Hulen Skains on 11-22-13, with multiple large stones in specimen and benign pathology. Pain initially resolved, however by ~March she had more abdominal  symptoms.  CT AP found large heterogeneous mass lower mid abdomen/ pelvis 19.4 x 13.6 x 14.3 cm with soft tissue along pericolic gutters and left anterior abdomen, with mild bilateral hydronephrosis and small right pleural effusion. CA 125 resulted at 67.9 and CEA was 1.0. She saw Dr Alycia Rossetti on 02-09-14 , was hospitalized at [facility] from 02-11-14 thru 02-25-14, with exploratory laparotomy on 02-15-14. Surgical findings were 20 cm mass filling pelvis and involving rectosigmoid and bilateral ovaries and tubes, with extension of tumor deep in pelvis and along rectum. Surgery was BSO/omentectomy and rectosigmoid resection with reanastomosis was performed, then diverting loop colostomy due to intraoperative concerns about the proximal rectal stump. She had bilateral ureterolysis for retroperitoneal fibrosis. At completion of procedure, which was optimal debulking, there were scattered <1 cm nodules on peritoneum, small bowel and small bowel mesentery, with no appreciable upper abdominal disease. [facility] [phone] from 02-15-2014) found high grade carcinosarcoma 17 cm originating in one ovary which was adherent to multiple structures, metastatic disease in 1.8 cm peritoneal nodule as well as sigmoid colon nodule 1.5 cm and in omentum, no lymph nodes submitted, no identified LVSI, margin of one end of colon positive,  Preoperatively she had acute kidney injury, with increase in creatinine from baseline 0.7 to high of 2.17; creatinine postoperatively was 1.52.  PAC was placed at [facility] on 02-21-14.  Recommendations from [facility] multidisciplinary conference 02-23-2014 was for chemotherapy with platinum and taxane. She had cycle 1 taxol carboplatin on 03-06-24, complicated by severe taxol aches and by neutropenia day 11, gCSF support. CT CAP 05-23-14 showed peritoneal involvement such that avastin was added to chemotherapy beginning cycle 4. She completed 6 cycles of  carbo taxol 07-15-14. CT AP 08-15-14 showed further progression  along liver, presacral and deposits in pelvis. Treatment changed to doxil 11-24, continuing avastin. Repeat CT AP 10-19-14 done due to worsening pain, showed significant progression including perihepatic and pelvis compared with mid Oct. Radiation to pelvis began 11-07-14.    Subjective: Nausea better since ativan and other meds at [facility]. "Mouth so dry". Denies pain since last IV dilaudid. Per daughter, had had continuous nausea/ vomiting and uncontrolled pain since last week. No other bleeding. Denies SOB supine on RA.  Objective: Vital signs in last 24 hours: Blood pressure 138/66, pulse 85, temperature 98 F (36.7 C), temperature source Oral, resp. rate 18, SpO2 96 %.   Intake/Output from previous day:   Intake/Output this shift:    Physical exam: Ill appearing lady, rouses to voice, appropriate responses, oriented to place and situation. Alopecia. PERRL. Not clearly icteric, is pale. Mucous membranes dry without lesions. Lungs clear to auscultation. PAC accessed, site not remarkable. Heart RRR no gallop, clear heart sounds. Abdomen full, not tight, quiet, not tender to gentle exam. Some air in ostomy bag, stoma protruding more than when I had seen her last, parastomal hernia with palpable mass. LE no pitting edema, cords, tenderness. Feet warm. Moves all extremities. Speech fluent and appropriate.  Lab Results:  Recent Labs  11/07/14 1430  WBC 8.9  HGB 9.6*  HCT 29.7*  PLT 312  ANC 8.9 Hemoglobin down from 10.5 on 10-24-14.  BMET  Recent Labs  11/07/14 1430  NA 141  K 4.0  CO2 24  GLUCOSE 101  BUN 45.0*  CREATININE 3.8*  CALCIUM 9.0  remainder of full CMET with Tbili 0.42, AP 73, AST 23, ALT 10, Tprot 7.5, alb 3.0, Ca 9.0   UA sp gr 1.020, large blood,, TNTC RBC Urine culture sent from [place]  Studies/Results: . Last CT AP 10-19-14 reviewed again now.  Assessment/Plan: 1. 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 Primary goal now is control of symptoms, as unfortunately situation is rapidly worsening despite interventions that have been attempted. IV hydration, IV dilaudid prn for now, IV antiemetics prn. Foley may be difficult to place and may cause more discomfort, so suggest watching without this for now (Dr Louis Meckel knows her). I will discuss with radiation oncology in AM as she has RT scheduled and this may help bleeding. Consider scans if they would make a difference with interventions possible, but would just do comfort care at least tonight.  2.DNR at patient's request. I have told family that hospice will be best support if she is able to return home. 3.uric acid nephrolithiasis, known to Dr Louis Meckel 4.PAC in 5.laparoscopic cholecystectomy 10-2013 6.hypothyroid on replacement 7.hx HTN 8.flu vaccine done 9.blood loss anemia + chemo anemia: likely will drop more with hydration, but would not transfuse unless clearly symptomatic. Note she has recently had avastin. 10. Diverting colostomy done at time of gyn onc surgery: as above   Please page me between my rounds if I can be of help  [phone] Thank you  LIVESAY,LENNIS P MD

## 2014-11-07 NOTE — Assessment & Plan Note (Signed)
Patient's creatinine has increased to 3.8.  Most likely, this is secondary to severe dehydration.

## 2014-11-07 NOTE — H&P (Signed)
[facility] History and Physical  Patient: [name]  OTR:[id number]  DOB: 02-21-1946  DOS: the patient was seen and examined on 11/07/2014 PCP: Michiel Sites, MD  Chief Complaint: Nausea and vomiting  HPI: [name] is a 69 y.o. 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 Patient presented with complaints of one week of nausea and vomiting with poor oral intake. Patient at her baseline has pressure-like sensation on her bladder and occasional pain which has increased recently. Patient also has started having complaints of nausea and vomiting with inadequate oral intake. Since last 2 days patient has not been able to keep anything down. She complains of chronic hematuria which is also worsened over last 2 days. She denies any fever or chills. As her fentanyl patch was making her sleepy she stopped taking it on Saturday. Her last bowel movement was yesterday. She has changed her colostomy bag this morning and has not had any gas movement since then. She was recently diagnosed with uric acid stone but has not started taking the other. All.  The patient is coming from home. And at her baseline independent for most of her ADL.  Review of Systems: as mentioned in the history of present illness.  A Comprehensive review of the other systems is negative.  Past Medical History  Diagnosis Date  . Thyroid disease   . Hypertension   . Anemia   . Asthma   . Hiatal hernia   . Cancer 02/2014    ovarian ca  . Renal disorder    Past Surgical History  Procedure Laterality Date  . Tonsillectomy    . Thyroidectomy, partial    . Colostomy    . Abdominal hysterectomy      partial; still has ovaries  . Cholecystectomy N/A 11/22/2013    Procedure: LAPAROSCOPIC CHOLECYSTECTOMY WITH INTRAOPERATIVE CHOLANGIOGRAM;  Surgeon: Cherylynn Ridges, MD;   Location: [facility] OR;  Service: General;  Laterality: N/A;   Social History:  reports that she has never smoked. She has never used smokeless tobacco. She reports that she does not drink alcohol or use illicit drugs.  Allergies  Allergen Reactions  . Epinephrine Anaphylaxis  . Codeine Other (See Comments)    "ill"  . Demerol [Meperidine] Other (See Comments)    "ill"  . Latex Itching    Says gloves "used" to cause itching.. Not sure today if problem persists.  Kingsley Plan [Nitrofurantoin Macrocrystal] Other (See Comments)    "made her feel reclusive"  . Micardis [Telmisartan] Other (See Comments)    "didn't move for 6 hours"  . Protonix [Pantoprazole Sodium] Other (See Comments)    "ill"  . Sulfa Antibiotics Other (See Comments)    "blind spell"    Family History  Problem Relation Age of Onset  . Lung cancer Father 19  . Polycythemia Mother   . Leukemia Maternal Aunt   . Colon cancer Paternal Uncle   . Multiple myeloma Cousin     maternal first cousin  . Colon cancer Cousin     paternal first cousin dx in her 51s    Prior to Admission medications   Medication Sig Start Date End Date Taking? Authorizing Provider  allopurinol (ZYLOPRIM) 100 MG tablet Take 2 tablets (200 mg total) by mouth daily. Patient not taking: Reported on 11/02/2014 10/24/14   Reece Packer, MD  amLODipine (NORVASC) 5 MG tablet Take 5 mg by  mouth daily.    Historical Provider, MD  BIOTIN PO Take 1 tablet by mouth daily.    Historical Provider, MD  fentaNYL (DURAGESIC - DOSED MCG/HR) 25 MCG/HR patch Place 1 patch (25 mcg total) onto the skin every 3 (three) days. 10/24/14   Lennis Marion Downer, MD  HYDROmorphone (DILAUDID) 2 MG tablet Take 1- 2 tablets by mouth every 4-6 hours as needed for pain 10/18/14   Lennis P Marko Plume, MD  levothyroxine (SYNTHROID, LEVOTHROID) 75 MCG tablet Take 75 mcg by mouth daily before breakfast.    Historical Provider, MD  lidocaine-prilocaine (EMLA) cream Apply 1 application  topically as needed. 10/18/14   Lennis Marion Downer, MD  loratadine (CLARITIN) 10 MG tablet Take 10 mg by mouth daily as needed for allergies.    Historical Provider, MD  metoprolol succinate (TOPROL-XL) 50 MG 24 hr tablet Take 50 mg by mouth daily. Take with or immediately following a meal.    Historical Provider, MD  Multiple Vitamin (MULTIVITAMIN WITH MINERALS) TABS tablet Take 1 tablet by mouth daily.    Historical Provider, MD  ondansetron (ZOFRAN) 8 MG tablet TAKE 1 TABLET THE MORNING AFTER CHEMO, THEN TAKE 1 EVERY 8 HOURS AS NEEDED FOR NAUSEA/VOMITING 11/07/14   Lennis Marion Downer, MD  OVER THE COUNTER MEDICATION Place 1 each into both eyes 2 (two) times daily. Pure Moist.    Historical Provider, MD  promethazine (PHENERGAN) 25 MG suppository Place 1 suppository (25 mg total) rectally every 6 (six) hours as needed for nausea or vomiting. 05/06/14   Lennis Marion Downer, MD  promethazine (PHENERGAN) 25 MG tablet Take 0.5-1 tablets (12.5-25 mg total) by mouth every 6 (six) hours as needed for nausea or vomiting. 10/07/14   Lennis Marion Downer, MD  ranitidine (ZANTAC) 75 MG tablet Take 75 mg by mouth daily as needed.     Historical Provider, MD  tamsulosin (FLOMAX) 0.4 MG CAPS capsule Take 0.4 mg by mouth daily.    Historical Provider, MD    Physical Exam: Filed Vitals:   11/07/14 1815  BP: 138/66  Pulse: 85  Temp: 98 F (36.7 C)  TempSrc: Oral  Resp: 18  SpO2: 96%    General: Alert, Awake and Oriented to Time, Place and Person. Appear in mild distress Eyes: PERRL ENT: Oral Mucosa clear dry. Neck: no JVD Cardiovascular: S1 and S2 Present, no Murmur, Peripheral Pulses Present Respiratory: Bilateral Air entry equal and Decreased, Clear to Auscultation, noCrackles, no wheezes Abdomen: Bowel Sound absent, Soft and lower Quadrant tender Skin: no Rash Extremities: Bilateral Pedal edema, non calf tenderness Neurologic: Grossly no focal neuro deficit.  Labs on Admission:  CBC:  Recent Labs Lab  11/07/14 1430  WBC 8.9  NEUTROABS 8.2*  HGB 9.6*  HCT 29.7*  MCV 88.0  PLT 312    CMP     Component Value Date/Time   NA 141 11/07/2014 1430   NA 143 08/27/2014 1751   K 4.0 11/07/2014 1430   K 3.9 08/27/2014 1751   CL 104 08/27/2014 1751   CO2 24 11/07/2014 1430   CO2 24 08/27/2014 1751   GLUCOSE 101 11/07/2014 1430   GLUCOSE 114* 08/27/2014 1751   BUN 45.0* 11/07/2014 1430   BUN 20 08/27/2014 1751   CREATININE 3.8* 11/07/2014 1430   CREATININE 1.17* 08/27/2014 1751   CALCIUM 9.0 11/07/2014 1430   CALCIUM 9.5 08/27/2014 1751   PROT 7.5 11/07/2014 1430   PROT 7.3 08/19/2014 0426   ALBUMIN 3.0* 11/07/2014 1430  ALBUMIN 3.8 08/19/2014 0426   AST 23 11/07/2014 1430   AST 18 08/19/2014 0426   ALT 10 11/07/2014 1430   ALT 12 08/19/2014 0426   ALKPHOS 73 11/07/2014 1430   ALKPHOS 51 08/19/2014 0426   BILITOT 0.42 11/07/2014 1430   BILITOT <0.2* 08/19/2014 0426   GFRNONAA 46* 08/27/2014 1751   GFRAA 54* 08/27/2014 1751    No results for input(s): LIPASE, AMYLASE in the last 168 hours. No results for input(s): AMMONIA in the last 168 hours.  No results for input(s): CKTOTAL, CKMB, CKMBINDEX, TROPONINI in the last 168 hours. BNP (last 3 results) No results for input(s): PROBNP in the last 8760 hours.  Radiological Exams on Admission: No results found.   Assessment/Plan Principal Problem:   AKI (acute kidney injury) Active Problems:   Essential hypertension, benign   Hypothyroidism   GERD (gastroesophageal reflux disease)   Ovarian carcinosarcoma   Uric acid nephrolithiasis   Nausea with vomiting   Hematuria   1. AKI (acute kidney injury) The patient is presenting with complaints of nausea vomiting with poor oral intake. Laboratory as an outpatient shows acute kidney injury with elevated BUN. She does have history of extensive metastatic uterine cancer undergoing chemotherapy and radiation with palliation. I discussed the case with patient's primary  oncologist who recommended supportive management with symptom control at present and she will reevaluate the patient in the morning and will decide about further workup due to patient's extensive cancer. Probable etiology is combination of prerenal, renal and postrenal causes. At present she will receive IV hydration with IV fluids and I would reevaluate her CMP in the morning. Avoid nephrotoxic medication.  2. Intractable nausea and vomiting. Patient does not appear to have any significant bowel sounds. Doesn't appear to have passing gas. Currently I will keep her nothing by mouth except medication. Nausea as needed and anticipate as needed. Use Zofran and Ativan as needed.  3. Cancer-related pain. Patient was on fentanyl which she has stopped taking. At present due to her acute kidney injury I will also continue holding fentanyl patch and use when necessary Dilaudid.  4. Uric acid stone. Patient was started on albuterol as an outpatient which she has not started taking yet. Currently due to large tumor burden she should be on allopurinol. Also her history of uric acid stone also indicates use of allopurinol. Monitor for hypersensitivity due to acute kidney injury.  Advance goals of care discussion: DNR/DNI as per my discussion with the patient. Family and patient's goal is comfort first   Consults: Oncology  DVT Prophylaxis: subcutaneous Heparin  nutrition: Nothing by mouth except medications  Family Communication: Family was present at bedside, opportunity was given to ask question and all questions were answered satisfactorily at the time of interview. Disposition: Admitted to inpatient in med-surge unit.  Author: Berle Mull, MD [facility] Pager: [phone] 11/07/2014, 7:54 PM    If 7PM-7AM, please contact night-coverage [email or web address] Password [email or web address]

## 2014-11-07 NOTE — Assessment & Plan Note (Signed)
Patient reports having difficulty urinating for the past 3 days.  This morning she began to urinate again; but is now having gross hematuria.  Urinalysis obtained today did verify gross hematuria and moderate leukocytes.  Most likely, hematuria is related to large tumor burden surrounding the bladder and ureters.

## 2014-11-07 NOTE — Assessment & Plan Note (Signed)
Patient complaining of cancer-related chronic pain to her lower abdomen and low back.  Patient states she discontinued her signal patch within the past several day; stating that she felt the signal patch was created been to her nausea.  Patient continues to take Dilaudid orally as directed.  Patient was also given Dilaudid 2 mg IV while at the [place] today.

## 2014-11-08 ENCOUNTER — Ambulatory Visit: Payer: Medicare Other

## 2014-11-08 ENCOUNTER — Inpatient Hospital Stay (HOSPITAL_COMMUNITY): Payer: Medicare Other

## 2014-11-08 ENCOUNTER — Telehealth: Payer: Self-pay | Admitting: *Deleted

## 2014-11-08 ENCOUNTER — Ambulatory Visit: Payer: 59 | Admitting: Radiation Oncology

## 2014-11-08 DIAGNOSIS — R112 Nausea with vomiting, unspecified: Secondary | ICD-10-CM

## 2014-11-08 DIAGNOSIS — D500 Iron deficiency anemia secondary to blood loss (chronic): Secondary | ICD-10-CM

## 2014-11-08 DIAGNOSIS — R52 Pain, unspecified: Secondary | ICD-10-CM

## 2014-11-08 DIAGNOSIS — R310 Gross hematuria: Secondary | ICD-10-CM

## 2014-11-08 DIAGNOSIS — D6481 Anemia due to antineoplastic chemotherapy: Secondary | ICD-10-CM

## 2014-11-08 DIAGNOSIS — N200 Calculus of kidney: Secondary | ICD-10-CM

## 2014-11-08 DIAGNOSIS — C7989 Secondary malignant neoplasm of other specified sites: Secondary | ICD-10-CM

## 2014-11-08 LAB — CBC
HEMATOCRIT: 28.9 % — AB (ref 36.0–46.0)
HEMOGLOBIN: 9.1 g/dL — AB (ref 12.0–15.0)
MCH: 28.6 pg (ref 26.0–34.0)
MCHC: 31.5 g/dL (ref 30.0–36.0)
MCV: 90.9 fL (ref 78.0–100.0)
Platelets: 298 10*3/uL (ref 150–400)
RBC: 3.18 MIL/uL — ABNORMAL LOW (ref 3.87–5.11)
RDW: 15.3 % (ref 11.5–15.5)
WBC: 6.8 10*3/uL (ref 4.0–10.5)

## 2014-11-08 LAB — PROTIME-INR
INR: 1.17 (ref 0.00–1.49)
Prothrombin Time: 15 seconds (ref 11.6–15.2)

## 2014-11-08 LAB — COMPREHENSIVE METABOLIC PANEL
ALBUMIN: 3.3 g/dL — AB (ref 3.5–5.2)
ALT: 11 U/L (ref 0–35)
AST: 22 U/L (ref 0–37)
Alkaline Phosphatase: 61 U/L (ref 39–117)
Anion gap: 16 — ABNORMAL HIGH (ref 5–15)
BUN: 51 mg/dL — ABNORMAL HIGH (ref 6–23)
CO2: 22 mmol/L (ref 19–32)
CREATININE: 4.03 mg/dL — AB (ref 0.50–1.10)
Calcium: 8.2 mg/dL — ABNORMAL LOW (ref 8.4–10.5)
Chloride: 104 mEq/L (ref 96–112)
GFR calc non Af Amer: 10 mL/min — ABNORMAL LOW (ref >90)
GFR, EST AFRICAN AMERICAN: 12 mL/min — AB (ref >90)
Glucose, Bld: 78 mg/dL (ref 70–99)
Potassium: 4 mmol/L (ref 3.5–5.1)
Sodium: 142 mmol/L (ref 135–145)
TOTAL PROTEIN: 7.1 g/dL (ref 6.0–8.3)
Total Bilirubin: 0.9 mg/dL (ref 0.3–1.2)

## 2014-11-08 NOTE — Telephone Encounter (Signed)
Received VM from patient's daughter, [name], asking for Dr. Marko Plume to call her and give her an update on her mom's condition. Information passed along to Dr. Marko Plume and she is agreeable to call [name].

## 2014-11-08 NOTE — Progress Notes (Signed)
Thank you for consulting the Palliative Medicine Team at [facility] to meet your patient's and family's needs.   The reason that you asked [place] to see your patient is  For Clarification of GOC and options  I spoke with the patient at bedside,  and then her daughter Olivia Mackie by telephone.  Olivia Mackie wishes to speak with her mother before scheduling a meeting with PMT.  She tells me she will call me in the morning to schedule a meeting.  Wadie Lessen NP  Palliative Medicine Team Team Phone # [phone] Pager [phone]

## 2014-11-08 NOTE — Progress Notes (Signed)
ELIANA LUETH FFM:[id number] DOB: 1946-06-12 DOA: 11/07/2014 PCP: Dwan Bolt, MD  Brief narrative: 69 y/o ? c Uterine CA, Ext Mets fol OP Dr. Marko Plume admitted c 1 wk N/V/gross hematuria from [place] Uterine Ca-->bladder.  Dr. Marko Plume has seen, Patient has decided DNR, [place] trajectory c her Oncologist  Past medical history-As per Problem list Chart reviewed as below- Reviewed   Consultants:  Oncology  Palliatie care  Procedures:    Antibiotics:     Subjective   Emotional Mild N this am Some clots in urine Tolerated a can of coke so far    Objective    Interim History:   Telemetry:    Objective: Filed Vitals:   11/07/14 1815 11/07/14 2056 11/08/14 0547  BP: 138/66 122/65 128/72  Pulse: 85 82 85  Temp: 98 F (36.7 C) 97.5 F (36.4 C) 97.7 F (36.5 C)  TempSrc: Oral Oral Oral  Resp: _0 SpO2: 96% 95% 95%    Intake/Output Summary (Last 24 hours) at 11/08/14 1029 Last data filed at 11/08/14 0906  Gross per 24 hour  Intake 1008.33 ml  Output    100 ml  Net 908.33 ml    Exam:  General: eomi, ncat Cardiovascular:  s1 s2 no m/r/g Respiratory: clear no added sound Abdomen: Distended, Colostomy c no stool Skin o LE edema Neuro intact  Data Reviewed: Basic Metabolic Panel:  Recent Labs Lab 11/07/14 1430 11/08/14 0525  NA 141 142  K 4.0 4.0  CL  --  104  CO2 24 22  GLUCOSE 101 78  BUN 45.0* 51*  CREATININE 3.8* 4.03*  CALCIUM 9.0 8.2*   Liver Function Tests:  Recent Labs Lab 11/07/14 1430 11/08/14 0525  AST 23 22  ALT 10 11  ALKPHOS 73 61  BILITOT 0.42 0.9  PROT 7.5 7.1  ALBUMIN 3.0* 3.3*   No results for input(s): LIPASE, AMYLASE in the last 168 hours. No results for input(s): AMMONIA in the last 168 hours. CBC:  Recent Labs Lab 11/07/14 1430 11/08/14 0525  WBC 8.9 6.8  NEUTROABS 8.2*  --   HGB 9.6* 9.1*  HCT 29.7* 28.9*  MCV 88.0 90.9  PLT 312 298   Cardiac Enzymes: No results for input(s):  CKTOTAL, CKMB, CKMBINDEX, TROPONINI in the last 168 hours. BNP: Invalid input(s): POCBNP CBG: No results for input(s): GLUCAP in the last 168 hours.  No results found for this or any previous visit (from the past 240 hour(s)).   Studies:              All Imaging reviewed and is as per above notation   Scheduled Meds: . allopurinol  200 mg Oral Daily  . famotidine (PEPCID) IV  20 mg Intravenous Q24H  . heparin  5,000 Units Subcutaneous 3 times per day  . levothyroxine  75 mcg Oral QAC breakfast  . tamsulosin  0.4 mg Oral Daily   Continuous Infusions: . sodium chloride 125 mL/hr at 11/07/14 2105     Assessment/Plan: 1. Met Ovarian Carcinosarcoma T3c,NX,M1-Hospce trajectory-Palliative medicine to coordinate c Hospice Re: Modalities, Offer Choice etc.  Will assist where we can-IF choice is made, we can potentially d/c soon? 2. N/v continue IV pepcid for now.  Change IV to PO's when able-Keep IV if nauseous to allow for therapies such as Ativan, Diluadid-continue Cancer dosing for Zofran.  Cont IV saline for right now with aim to NS lock in 24 hours 3. AKI-stop checking labs-saline today-then d/c as per PMT  NO labs, Change routine of VS to q am 4. Hypothyroid-d/c synthroid per Palliative discretion  Code Status: DNAR Family Communication: sister bedside Disposition Plan:  ? [place] today?   Verneita Griffes, MD  [facility] Pager [phone] 11/08/2014, 10:29 AM    LOS: 1 day

## 2014-11-08 NOTE — Progress Notes (Signed)
MEDICAL ONCOLOGY 11/08/2014, 9:01 AM  Hospital day 2 Antibiotics:  Chemotherapy: #2 doxil 10-24-14; last avastin 10-24-14  EMR reviewed since I saw her last PM.  Spoke with Dr Sondra Come who is covering for Dr Pablo Ledger. As RT will not give immediate benefit and given rapidly worsening situation otherwise, will hold RT for now. Patient seen, sister in law at bedside.  Subjective: Feeling a little better this AM. Slept some thru night and prn dilaudid controlled pain well. Up to void 3x thru night and again for 100 cc "dark" urine now, states not as bloody as yesterday. Some nausea when up to BR, no vomiting over night. Did not swallow pill well this AM "throat too dry". Tolerating ice chips. More indigestion. Denies SOB in bed.    ONCOLOGIC HISTORY Patient had been in usual generally good health, status post remote hysterectomy, until abdominal pain with radiation to right shoulder in Jan 2015. With [place] evidence of cholelithiasis, she had laparoscopic cholecystectomy by Dr Hulen Skains on 11-22-13, with multiple large stones in specimen and benign pathology. Pain initially resolved, however by ~March she had more abdominal symptoms. CT AP found large heterogeneous mass lower mid abdomen/ pelvis 19.4 x 13.6 x 14.3 cm with soft tissue along pericolic gutters and left anterior abdomen, with mild bilateral hydronephrosis and small right pleural effusion. CA 125 resulted at 67.9 and CEA was 1.0. She saw Dr Alycia Rossetti on 02-09-14 , was hospitalized at [facility] from 02-11-14 thru 02-25-14, with exploratory laparotomy on 02-15-14. Surgical findings were 20 cm mass filling pelvis and involving rectosigmoid and bilateral ovaries and tubes, with extension of tumor deep in pelvis and along rectum. Surgery was BSO/omentectomy and rectosigmoid resection with reanastomosis was performed, then diverting loop colostomy due to intraoperative concerns about the proximal rectal stump. She had bilateral ureterolysis for retroperitoneal fibrosis.  At completion of procedure, which was optimal debulking, there were scattered <1 cm nodules on peritoneum, small bowel and small bowel mesentery, with no appreciable upper abdominal disease. [facility] [phone] from 02-15-2014) found high grade carcinosarcoma 17 cm originating in one ovary which was adherent to multiple structures, metastatic disease in 1.8 cm peritoneal nodule as well as sigmoid colon nodule 1.5 cm and in omentum, no lymph nodes submitted, no identified LVSI, margin of one end of colon positive,  Preoperatively she had acute kidney injury, with increase in creatinine from baseline 0.7 to high of 2.17; creatinine postoperatively was 1.52.  PAC was [facility] at [facility] on 02-21-14.  Recommendations from [facility] multidisciplinary conference 02-23-2014 was for chemotherapy with platinum and taxane. She had cycle 1 taxol carboplatin on 6-60-63, complicated by severe taxol aches and by neutropenia day 11, gCSF support. CT CAP 05-23-14 showed peritoneal involvement such that avastin was added to chemotherapy beginning cycle 4. She completed 6 cycles of carbo taxol 07-15-14. CT AP 08-15-14 showed further progression along liver, presacral and deposits in pelvis. Treatment changed to doxil 11-24, continuing avastin. Repeat CT AP 10-19-14 done due to worsening pain, showed significant progression including perihepatic and pelvis compared with mid Oct. Radiation to pelvis began 11-07-14. Admission 11-07-14 for symptom control.   Objective: Vital signs in last 24 hours: Blood pressure 128/72, pulse 85, temperature 97.7 F (36.5 C), temperature source Oral, resp. rate 18, SpO2 95 %.   Intake/Output from previous day: 01/11 0701 - 01/12 0700 In: 1008.3 [I.V.:1008.3] Out: -  Intake/Output this shift:    Physical exam: awake, alert, able to get to bathroom with effort. Looks more comfortable than last PM.  Pale, not icteric. PERRL. Oral mucosa still dry but better, no lesions. No JVD. Lungs clear  anteriorly. Heart RRR no gallop. PAC site fine,, infusing at 125/hr. Abdomen full, not tight, not tender to gentle exam, bowel sounds heard but minimal. Stoma no more protruded than previously, minimal air in ostomy bag, no stool. Skin without doxil-type irritation. Slight swelling LUE and LLE without cords or tenderness. Moves all extremities. Feet warm. Speech fluent and appropriate. Lab Results:  Recent Labs  11/07/14 1430 11/08/14 0525  WBC 8.9 6.8  HGB 9.6* 9.1*  HCT 29.7* 28.9*  PLT 312 298   BMET  Recent Labs  11/07/14 1430 11/08/14 0525  NA 141 142  K 4.0 4.0  CL  --  104  CO2 24 22  GLUCOSE 101 78  BUN 45.0* 51*  CREATININE 3.8* 4.03*  CALCIUM 9.0 8.2*    Studies/Results: No results found. Portable abd Xray ordered to see if bowel gas pattern suggests obstruction; may be more ileus with narcotics.    DISCUSSION: Hold RT for now, may or may not resume that depending on status otherwise and comfort with other modalities.No further chemo or avastin. Patient and family are in agreement with Hospice referral, that order placed in EMR now (but I did not call consult); I am glad to be attending for Hospice after DC. They understand that lack of output from ostomy may be bowel obstruction vs ileus from narcotics, also almost nothing po x days. Would continue IVF, IV dilaudid, pepcid, antiemetics. I requested BSC.  I am glad to speak with daughter if needed.    Assessment/Plan: 1. Carcinosarcoma of ovary extensively metastatic in abdomen and pelvis: tumor may be eroding into bladder with new gross hematuria and suspect at least partial obstruction of ostomy, acute renal failure likely pre and post renal, nausea and vomiting, uncontrolled pain. Primary goal now is control of symptoms, as unfortunately situation is rapidly worsening despite interventions that have been attempted. Some better with IV hydration, IV dilaudid prn, IV antiemetics prn, IV pepcid since admission, no  improvement in labs. WIll get abd Xray, do not see that repeat scans would change options now. Hold RT. No further chemo/avastin. 2.DNR at patient's request. Hospice consult requested. Note she lives at home with daughter Olivia Mackie, granddaughter Jerene Pitch ~ 55 yrs old and granddaughter Stanton Kidney age 40. Sister in law very helpful. 3.uric acid nephrolithiasis, known to Dr Louis Meckel 4.PAC in 5.laparoscopic cholecystectomy 10-2013 6.hypothyroid on replacement 7.hx HTN 8.flu vaccine done 9.blood loss anemia + chemo anemia: May be having more bleeding with avastin. Not symptomatic enough to need transfusion now 10. Diverting colostomy done at time of gyn onc surgery: as above 11. Multiple drug intolerances reported, some of which are likely not actually the drugs.  Please page if I can help between my rounds   Pager [phone]  Gordy Levan MD

## 2014-11-09 ENCOUNTER — Ambulatory Visit: Payer: Medicare Other

## 2014-11-09 DIAGNOSIS — G893 Neoplasm related pain (acute) (chronic): Secondary | ICD-10-CM

## 2014-11-09 DIAGNOSIS — Z515 Encounter for palliative care: Secondary | ICD-10-CM

## 2014-11-09 DIAGNOSIS — Z66 Do not resuscitate: Secondary | ICD-10-CM

## 2014-11-09 LAB — CBC
HCT: 28.9 % — ABNORMAL LOW (ref 36.0–46.0)
HEMOGLOBIN: 9.1 g/dL — AB (ref 12.0–15.0)
MCH: 28.8 pg (ref 26.0–34.0)
MCHC: 31.5 g/dL (ref 30.0–36.0)
MCV: 91.5 fL (ref 78.0–100.0)
Platelets: 312 10*3/uL (ref 150–400)
RBC: 3.16 MIL/uL — AB (ref 3.87–5.11)
RDW: 15.5 % (ref 11.5–15.5)
WBC: 5.5 10*3/uL (ref 4.0–10.5)

## 2014-11-09 LAB — BASIC METABOLIC PANEL
Anion gap: 15 (ref 5–15)
BUN: 58 mg/dL — AB (ref 6–23)
CHLORIDE: 110 meq/L (ref 96–112)
CO2: 20 mmol/L (ref 19–32)
CREATININE: 4.09 mg/dL — AB (ref 0.50–1.10)
Calcium: 8.2 mg/dL — ABNORMAL LOW (ref 8.4–10.5)
GFR calc Af Amer: 12 mL/min — ABNORMAL LOW (ref >90)
GFR calc non Af Amer: 10 mL/min — ABNORMAL LOW (ref >90)
Glucose, Bld: 77 mg/dL (ref 70–99)
POTASSIUM: 4 mmol/L (ref 3.5–5.1)
Sodium: 145 mmol/L (ref 135–145)

## 2014-11-09 LAB — URINE CULTURE

## 2014-11-09 MED ORDER — SALINE SPRAY 0.65 % NA SOLN
1.0000 | NASAL | Status: DC | PRN
  Filled 2014-11-09: qty 44

## 2014-11-09 MED ORDER — LORAZEPAM 2 MG/ML IJ SOLN
0.5000 mg | INTRAMUSCULAR | Status: DC | PRN
  Administered 2014-11-10 – 2014-11-11 (×3): 0.5 mg via INTRAVENOUS
  Filled 2014-11-09 (×3): qty 1

## 2014-11-09 MED ORDER — DOCUSATE SODIUM 100 MG PO CAPS
100.0000 mg | ORAL_CAPSULE | Freq: Two times a day (BID) | ORAL | Status: DC
  Filled 2014-11-09 (×6): qty 1

## 2014-11-09 NOTE — Progress Notes (Signed)
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 Presented with nausea and vomiting. She also was noted to have acute renal failure. She also had lower abdominal pain.  Past medical history:  Past Medical History  Diagnosis Date  . Thyroid disease   . Hypertension   . Anemia   . Asthma   . Hiatal hernia   . Cancer 02/2014    ovarian ca  . Renal disorder     Consultants: Oncology. Palliative medicine  Procedures: None  Antibiotics: None  Subjective: Patient sleeping but arousable. Has some lower abdominal pain. Denies any other complaints.  Objective: Vital Signs  Filed Vitals:   11/08/14 0547 11/08/14 1327 11/08/14 2039 11/09/14 0535  BP: 128/72 137/71 127/73 132/68  Pulse: 85 84 91 93  Temp: 97.7 F (36.5 C) 98.2 F (36.8 C) 98.3 F (36.8 C) 97.8 F (36.6 C)  TempSrc: Oral Oral Oral Oral  Resp: $Remo'18 17 18 16  'ENOjN$ SpO2: 95% 94% 95% 94%    Intake/Output Summary (Last 24 hours) at 11/09/14 1217 Last data filed at 11/09/14 0700  Gross per 24 hour  Intake 1481.25 ml  Output    850 ml  Net 631.25 ml   There were no vitals filed for this visit.  General appearance: appears stated age, no distress and slowed mentation Resp: clear to auscultation bilaterally Cardio: regular rate and rhythm, S1, S2 normal, no murmur, click, rub or gallop GI: soft, mildly tender in the lower abdomen. There is a colostomy. Extremities: extremities normal, atraumatic, no cyanosis or edema Neurologic: Easily arousable. Moving all her extremities.  Lab Results:  Basic Metabolic Panel:  Recent Labs Lab 11/07/14 1430 11/08/14 0525 11/09/14 0823  NA 141 142 145  K 4.0 4.0 4.0  CL  --  104 110  CO2 $Re'24 22 20  'dvV$ GLUCOSE 101 78 77  BUN 45.0* 51* 58*  CREATININE 3.8* 4.03* 4.09*  CALCIUM 9.0 8.2* 8.2*   Liver  Function Tests:  Recent Labs Lab 11/07/14 1430 11/08/14 0525  AST 23 22  ALT 10 11  ALKPHOS 73 61  BILITOT 0.42 0.9  PROT 7.5 7.1  ALBUMIN 3.0* 3.3*   CBC:  Recent Labs Lab 11/07/14 1430 11/08/14 0525 11/09/14 0823  WBC 8.9 6.8 5.5  NEUTROABS 8.2*  --   --   HGB 9.6* 9.1* 9.1*  HCT 29.7* 28.9* 28.9*  MCV 88.0 90.9 91.5  PLT 312 298 312    Studies/Results: Dg Abd Portable 1v  11/08/2014   CLINICAL DATA:  Evaluate for bowel obstruction  EXAM: PORTABLE ABDOMEN - 1 VIEW  COMPARISON:  Correlation with CT from 10/19/2014  FINDINGS: A left-sided ostomy is present. Metallic clips and surgical suture material is noted within the pelvis. Right upper quadrant cholecystectomy clips are present.  Focally dilated small bowel loops are noted within the left upper quadrant measuring up to 3.5 cm. The colon is relatively decompressed. A moderate amount of retained stool is noted within the residual colon.  Degenerative changes of the hips and spine are noted.  IMPRESSION: 1. Focally prominent small-bowel loops within the left upper quadrant are nonspecific for obstruction. Follow-up is recommended.  2. Moderate amount of retained stool.   Electronically Signed   By: Rosemarie Ax   On: 11/08/2014 10:24    Medications:  Scheduled: . docusate sodium  100 mg Oral BID  .  famotidine (PEPCID) IV  20 mg Intravenous Q24H  . heparin  5,000 Units Subcutaneous 3 times per day  . levothyroxine  75 mcg Oral QAC breakfast  . tamsulosin  0.4 mg Oral Daily   Continuous: . sodium chloride 125 mL/hr at 11/09/14 0543   FTD:DUKGURKYHCWCB **OR** acetaminophen, HYDROmorphone (DILAUDID) injection, LORazepam, ondansetron (ZOFRAN) IV, promethazine, sodium chloride  Assessment/Plan:  Principal Problem:   AKI (acute kidney injury) Active Problems:   Essential hypertension, benign   Hypothyroidism   GERD (gastroesophageal reflux disease)   Ovarian carcinosarcoma   Uric acid nephrolithiasis   Nausea with  vomiting   Hematuria    Met Ovarian Carcinosarcoma T3c,NX,M1 Symptoms likely due to progression of disease. Renal failure, likely due to obstruction from tumor. Per oncology. She has failed treatment. Currently main goal will be symptom management and palliation. Await goals of care by palliative medicine. It appears that we are heading towards hospice care at home. Continue pain control. Discontinue unnecessary medications.  AKI Slightly Secondary to obstruction by tumor. No good options. Stop checking labs.  Hypothyroid Continue levothyroxine for now.  DVT Prophylaxis: Change to SCDs  Code Status: DO NOT RESUSCITATE  Family Communication: Discussed with her niece at bedside  Disposition Plan: Unclear for now. Possibly home with hospice or hospice facility.    LOS: 2 days   [facility] Pager [phone] 11/09/2014, 12:17 PM  If 7PM-7AM, please contact night-coverage at [email or web address], password [facility]

## 2014-11-09 NOTE — Consult Note (Signed)
WOC ostomy consult note Stoma type/location: LLQ Loop colostomy Stomal assessment/size: 1 and 1/2 inch x 2 inches oval Peristomal assessment: did not see today Treatment options for stomal/peristomal skin: none indicated Output None prior to irrigation, two hard balls of stool following irrigation Ostomy pouching: 2pc. Pouching system, 2 and 3/4 inch. Supplies at [employer] provided: patient's colostomy irrigated per MD order. Bedside RN Shanon Brow) assisting.  Proximal limb explored digitally by [place] Nurse and several hard balls of stool noted.  743mls of warm tap water instilled over 10 minutes using a cone irrigator and tolerated well.  Immediate return of tea-colored water and two hard stool balls received. Over the next ten minutes, another 254mls of tea colored water returned.  No further stool expelled.  Ostomy irrigation sleeve removed and cleansed in the event it needs to be used again. Pouch applied over her existing skin barrier. [place] nursing team will not follow, but will remain available to this patient, the nursing and medical teams.  Please re-consult if needed. Thanks, Maudie Flakes, MSN, RN, [place], [place], [place] [phone])

## 2014-11-09 NOTE — Progress Notes (Signed)
MEDICAL ONCOLOGY 11/09/2014, 9:38 AM  Hospital day 3 Antibiotics:  Chemotherapy: #2 doxil 12-28, last avastin 12-28  EMR reviewed. Patient seen, daughter here.  Subjective: Has been able to sleep, nausea better, tolerating sips of clear liquids. Voiding every couple of hours, urine not bloody and less concentrated. Pain controlled with prn IV dilaudid, used x4 yesterday and x2 overnight. Nothing from ostomy. Able to walk to BR with assistance. Drowsy after prn ativan, but more comfortable. No other bleeding. No increased SOB.  Patient and daughter want Hospice, but prefer not to have palliative consult for goals of care, due to difficult experience with husband's terminal illness 2 years ago. I have spoken with palliative care team to let them know [phone]); I believe that case manager is already aware of desire for Hospice.  Objective: Vital signs in last 24 hours: Blood pressure 132/68, pulse 93, temperature 97.8 F (36.6 C), temperature source Oral, resp. rate 16, SpO2 94 %.   Intake/Output from previous day: 01/12 0701 - 01/13 0700 In: 1481.3 [I.V.:1481.3] Out: 950 [Urine:950] Intake/Output this shift:  IV NS at 125  Physical exam: sleeping initially, roused easily to voice, oriented & appropriate. Pale, not icteric. Respirations not labored RA in bed. Oral mucosa still somewhat dry. Lungs without wheezes or rales. Abdomen full, quiet, stoma pink and unchanged, minimal air in ostomy bag. LE trace - 1+ edema, feet warm. Obviously fatigued when up to BR with assistance of daughter.  Lab Results:  Recent Labs  11/08/14 0525 11/09/14 0823  WBC 6.8 5.5  HGB 9.1* 9.1*  HCT 28.9* 28.9*  PLT 298 312   BMET available at end of visit: no improvement in creatinine, still 4.0 with BUN up to 58, glucose 77, K 4.0, Na 145  Recent Labs  11/07/14 1430 11/08/14 0525  NA 141 142  K 4.0 4.0  CL  --  104  CO2 24 22  GLUCOSE 101 78  BUN 45.0* 51*  CREATININE 3.8* 4.03*  CALCIUM  9.0 8.2*   I have confirmed with [facility] lab that urine culture was sent on 11-07-14, still pending. (This shows in hospital EMR without collection date now  Studies/Results: Dg Abd Portable 1v  11/08/2014   CLINICAL DATA:  Evaluate for bowel obstruction  EXAM: PORTABLE ABDOMEN - 1 VIEW  COMPARISON:  Correlation with CT from 10/19/2014  FINDINGS: A left-sided ostomy is present. Metallic clips and surgical suture material is noted within the pelvis. Right upper quadrant cholecystectomy clips are present.  Focally dilated small bowel loops are noted within the left upper quadrant measuring up to 3.5 cm. The colon is relatively decompressed. A moderate amount of retained stool is noted within the residual colon.  Degenerative changes of the hips and spine are noted.  IMPRESSION: 1. Focally prominent small-bowel loops within the left upper quadrant are nonspecific for obstruction. Follow-up is recommended.  2. Moderate amount of retained stool.   Electronically Signed   By: Rosemarie Ax   On: 11/08/2014 10:24   MEDICATIONS: allopurinol 200 mg DCd as dose not correct for this degree of renal insufficiency  Assessment/Plan: 1. Carcinosarcoma of ovary extensively metastatic in abdomen and pelvis: tumor may be eroding into bladder with new gross hematuria and suspect at least partial obstruction of ostomy, acute renal failure likely pre and post renal, nausea and vomiting,  pain. Primary goal now is control of symptoms, as unfortunately situation is rapidly worsening despite interventions that have been attempted. Some better with IV hydration, IV dilaudid  prn, IV antiemetics prn, IV pepcid since admission, no improvement in labs. No further chemo/avastin.Holding RT. Try irrigating ostomy as stool seen on Xray. Would consider IV dilaudid via PAC/pump at home when DC with hospice. 2.DNR at patient's request. Hospice requested for assistance after DC home. Note she lives at home with daughter Olivia Mackie,  granddaughter Jerene Pitch ~ 84 yrs old and granddaughter Stanton Kidney age 55. Sister in law very helpful. Daughter tells me that patient much prefers home to [employer]. Cancelled palliative goals of care discussion at request of patient and daughter 3.uric acid nephrolithiasis, known to Dr Louis Meckel 4.PAC in 5.laparoscopic cholecystectomy 10-2013 6.hypothyroid on replacement 7.hx HTN 8.flu vaccine done 9.blood loss anemia + chemo anemia: Hgb stable. OK with minimal activity 10. Diverting colostomy done at time of gyn onc surgery: as above 11. Multiple drug intolerances reported, some of which may not actually be the drugs.  Please call between my rounds if needed [name] P

## 2014-11-09 NOTE — Progress Notes (Signed)
CARE MANAGEMENT NOTE 11/09/2014  Patient:  [name], [name]   Account Number:  [id number]  Date Initiated:  11/09/2014  Documentation initiated by:  Edwyna Shell  Subjective/Objective Assessment:   69 yo female admitted with AKI from home     Action/Plan:   discharge planning   Anticipated DC Date:  11/10/2014   Anticipated DC Plan:  [place]  CM consult      Choice offered to / List presented to:             Status of service:  In process, will continue to follow Medicare Important Message given?   (If response is "NO", the following Medicare IM given date fields will be blank) Date Medicare IM given:   Medicare IM given by:   Date Additional Medicare IM given:   Additional Medicare IM given by:    Discharge Disposition:    Per UR Regulation:    If discussed at Long Length of Stay Meetings, dates discussed:    Comments:  11/09/14 Edwyna Shell RN [facility] CM [phone] Spoke with patient daughter [name] and she stated that the patient and her live together and the patient was using a walker in the home prior to hospitalization. Discussed the follow up meeting with PCT and the daughter stated that she feels that the best plan will be for the patient to return home with hospice but she has a lot of questions around this. Encouraged the meeting with PCT for [facility] to follow up with discharge planning needs. [name] stated that she understood and requesting to speak with PCT, request communicated to [facility], NP PCT. Will continue to follow for DC planning needs.

## 2014-11-09 NOTE — Consult Note (Signed)
Patient XN:ATFTDD [name]      DOB: August 27, 1946      UKG:[id number]     Consult Note from the Palliative Medicine Team at [facility] Requested by: Dr Marko Plume     PCP: Dwan Bolt, MD Reason for Consultation:Clarification of [place] and options     Phone Number:[phone]  Assessment of patients Current state:  Request from daughter [name] to help her "figure out" best options for her mother's care. Patient with continued physical and functional decline 2/2 to metastatic ovarian cancer.    Patient and family faced with treatment option decisions, advanced directive decisions and anticipatory care needs. Complicated with layers of grief, husband died less than 18 months ago.  This NP Wadie Lessen reviewed medical records, received report from team, assessed the patient and then meet at the patient's bedside along with her daughter [name]  to discuss diagnosis, prognosis, GOC, EOL wishes disposition and options. (at family request)  A discussion was had today regarding advanced directives.  Concepts specific to code status, artifical feeding and hydration, continued IV antibiotics and rehospitalization was had.  The difference between a aggressive medical intervention path  and a palliative comfort care path for this patient at this time was had.  Values and goals of care important to patient and family were attempted to be elicited.  Concept of Hospice and Palliative Care were discussed  Natural trajectory and expectations at EOL were discussed.  Questions and concerns addressed.  Family encouraged to call with questions or concerns.  PMT will continue to support holistically.   Goals of Care: 1.  Code Status:  DNR/DNI-comfort is the main focus of care   2. Scope of Treatment: Continue current level of care until further decisions defined.  Family is in process of finalizing important legal documents.  Without fluid support patient will decline rapidly.  3.  Disposition:  Patient and family are leaning toward a hospice facility, daughter is planning to vist local facility today.  Will clarify in the morning   4. Symptom Management:  Patient hopes for symptom management with lowest possible doses of medications    1. Anxiety/Agitation: Decreased Ativan to 0.5 mg every 4 hrs prn 2. Pain: Dilaudid IV 1-2 mg every 4 hrs prn                 Place foley cath for increased pain trying to void 3. Nausea/Vomiting: Zofran 8 mg IV every 8 hrs prn   5. Psychosocial:   Emotional support offered to patient and family at bedside.  Allowed space and time for Tracey to vent feelings and frustration with past hospice experience during the passing of her father, she felt very unprepared and uneducated on what exactly to expect at EOL.     Patient Documents Completed or Given: Document Given Completed  Advanced Directives Pkt    MOST    DNR X   Gone from My Sight    Hard Choices X     Brief HPI:   Carcinosarcoma of ovary extensively metastatic in abdomen and pelvis: tumor may be eroding into bladder with new gross hematuria and suspect at least partial obstruction of ostomy, acute renal failure likely pre and post renal, nausea and vomiting, pain.   ROS: "too sleepy today to feel anything"   PMH:  Past Medical History  Diagnosis Date  . Thyroid disease   . Hypertension   . Anemia   . Asthma   . Hiatal hernia   .  Cancer 02/2014    ovarian ca  . Renal disorder      PSH: Past Surgical History  Procedure Laterality Date  . Tonsillectomy    . Thyroidectomy, partial    . Colostomy    . Abdominal hysterectomy      partial; still has ovaries  . Cholecystectomy N/A 11/22/2013    Procedure: LAPAROSCOPIC CHOLECYSTECTOMY WITH INTRAOPERATIVE CHOLANGIOGRAM;  Surgeon: Gwenyth Ober, MD;  Location: [facility];  Service: General;  Laterality: N/A;   I have reviewed the [place] and SH and  If appropriate update it with new information. Allergies  Allergen  Reactions  . Epinephrine Anaphylaxis  . Codeine Other (See Comments)    "ill"  . Demerol [Meperidine] Other (See Comments)    "ill"  . Latex Itching    Says gloves "used" to cause itching.. Not sure today if problem persists.  Clancy Gourd [Nitrofurantoin Macrocrystal] Other (See Comments)    "made her feel reclusive"  . Micardis [Telmisartan] Other (See Comments)    "didn't move for 6 hours"  . Protonix [Pantoprazole Sodium] Other (See Comments)    "ill"  . Sulfa Antibiotics Other (See Comments)    "blind spell"   Scheduled Meds: . famotidine (PEPCID) IV  20 mg Intravenous Q24H  . heparin  5,000 Units Subcutaneous 3 times per day  . levothyroxine  75 mcg Oral QAC breakfast  . tamsulosin  0.4 mg Oral Daily   Continuous Infusions: . sodium chloride 125 mL/hr at 11/09/14 0543   PRN Meds:.acetaminophen **OR** acetaminophen, HYDROmorphone (DILAUDID) injection, LORazepam, ondansetron (ZOFRAN) IV, promethazine    BP 132/68 mmHg  Pulse 93  Temp(Src) 97.8 F (36.6 C) (Oral)  Resp 16  SpO2 94%   PPS:20 %   Intake/Output Summary (Last 24 hours) at 11/09/14 1133 Last data filed at 11/09/14 0700  Gross per 24 hour  Intake 1481.25 ml  Output    850 ml  Net 631.25 ml    Physical Exam:  General: ill appearing, pale and weak HEENT:  Dry buccal membranes Abdomen: noted ostomy, diffuse tenderness  Labs: CBC    Component Value Date/Time   WBC 5.5 11/09/2014 0823   WBC 8.9 11/07/2014 1430   RBC 3.16* 11/09/2014 0823   RBC 3.37* 11/07/2014 1430   HGB 9.1* 11/09/2014 0823   HGB 9.6* 11/07/2014 1430   HCT 28.9* 11/09/2014 0823   HCT 29.7* 11/07/2014 1430   PLT 312 11/09/2014 0823   PLT 312 11/07/2014 1430   MCV 91.5 11/09/2014 0823   MCV 88.0 11/07/2014 1430   MCH 28.8 11/09/2014 0823   MCH 28.4 11/07/2014 1430   MCHC 31.5 11/09/2014 0823   MCHC 32.2 11/07/2014 1430   RDW 15.5 11/09/2014 0823   RDW 15.2* 11/07/2014 1430   LYMPHSABS 0.2* 11/07/2014 1430    LYMPHSABS 0.6* 08/19/2014 0426   MONOABS 0.5 11/07/2014 1430   MONOABS 1.0 08/19/2014 0426   EOSABS 0.0 11/07/2014 1430   EOSABS 0.0 08/19/2014 0426   BASOSABS 0.0 11/07/2014 1430   BASOSABS 0.0 08/19/2014 0426    BMET    Component Value Date/Time   NA 145 11/09/2014 0823   NA 141 11/07/2014 1430   K 4.0 11/09/2014 0823   K 4.0 11/07/2014 1430   CL 110 11/09/2014 0823   CO2 20 11/09/2014 0823   CO2 24 11/07/2014 1430   GLUCOSE 77 11/09/2014 0823   GLUCOSE 101 11/07/2014 1430   BUN 58* 11/09/2014 0823   BUN 45.0* 11/07/2014 1430   CREATININE  4.09* 11/09/2014 0823   CREATININE 3.8* 11/07/2014 1430   CALCIUM 8.2* 11/09/2014 0823   CALCIUM 9.0 11/07/2014 1430   GFRNONAA 10* 11/09/2014 0823   GFRAA 12* 11/09/2014 0823    CMP     Component Value Date/Time   NA 145 11/09/2014 0823   NA 141 11/07/2014 1430   K 4.0 11/09/2014 0823   K 4.0 11/07/2014 1430   CL 110 11/09/2014 0823   CO2 20 11/09/2014 0823   CO2 24 11/07/2014 1430   GLUCOSE 77 11/09/2014 0823   GLUCOSE 101 11/07/2014 1430   BUN 58* 11/09/2014 0823   BUN 45.0* 11/07/2014 1430   CREATININE 4.09* 11/09/2014 0823   CREATININE 3.8* 11/07/2014 1430   CALCIUM 8.2* 11/09/2014 0823   CALCIUM 9.0 11/07/2014 1430   PROT 7.1 11/08/2014 0525   PROT 7.5 11/07/2014 1430   ALBUMIN 3.3* 11/08/2014 0525   ALBUMIN 3.0* 11/07/2014 1430   AST 22 11/08/2014 0525   AST 23 11/07/2014 1430   ALT 11 11/08/2014 0525   ALT 10 11/07/2014 1430   ALKPHOS 61 11/08/2014 0525   ALKPHOS 73 11/07/2014 1430   BILITOT 0.9 11/08/2014 0525   BILITOT 0.42 11/07/2014 1430   GFRNONAA 10* 11/09/2014 0823   GFRAA 12* 11/09/2014 0823      Time In Time Out Total Time Spent with Patient Total Overall Time  1315 1430 70 min 75 min    Greater than 50%  of this time was spent counseling and coordinating care related to the above assessment and plan.   Wadie Lessen NP  Palliative Medicine Team Team Phone # [phone] Pager  [phone]

## 2014-11-10 ENCOUNTER — Ambulatory Visit: Payer: Medicare Other

## 2014-11-10 ENCOUNTER — Encounter: Payer: Self-pay | Admitting: Nutrition

## 2014-11-10 ENCOUNTER — Other Ambulatory Visit: Payer: Medicare Other

## 2014-11-10 ENCOUNTER — Ambulatory Visit: Payer: Medicare Other | Admitting: Oncology

## 2014-11-10 DIAGNOSIS — Z66 Do not resuscitate: Secondary | ICD-10-CM

## 2014-11-10 DIAGNOSIS — Z515 Encounter for palliative care: Secondary | ICD-10-CM

## 2014-11-10 DIAGNOSIS — D630 Anemia in neoplastic disease: Secondary | ICD-10-CM

## 2014-11-10 DIAGNOSIS — C561 Malignant neoplasm of right ovary: Secondary | ICD-10-CM

## 2014-11-10 LAB — BASIC METABOLIC PANEL
ANION GAP: 16 — AB (ref 5–15)
BUN: 57 mg/dL — AB (ref 6–23)
CHLORIDE: 110 meq/L (ref 96–112)
CO2: 19 mmol/L (ref 19–32)
Calcium: 8.1 mg/dL — ABNORMAL LOW (ref 8.4–10.5)
Creatinine, Ser: 4.31 mg/dL — ABNORMAL HIGH (ref 0.50–1.10)
GFR calc Af Amer: 11 mL/min — ABNORMAL LOW (ref >90)
GFR, EST NON AFRICAN AMERICAN: 10 mL/min — AB (ref >90)
Glucose, Bld: 88 mg/dL (ref 70–99)
Potassium: 3.8 mmol/L (ref 3.5–5.1)
Sodium: 145 mmol/L (ref 135–145)

## 2014-11-10 MED ORDER — HYDROMORPHONE HCL 1 MG/ML IJ SOLN
1.0000 mg | INTRAMUSCULAR | Status: DC | PRN
  Administered 2014-11-10 (×2): 2 mg via INTRAVENOUS
  Administered 2014-11-10 – 2014-11-11 (×3): 1 mg via INTRAVENOUS
  Filled 2014-11-10: qty 1
  Filled 2014-11-10 (×2): qty 2
  Filled 2014-11-10 (×2): qty 1

## 2014-11-10 NOTE — Progress Notes (Signed)
Progress Note from the Palliative Medicine Team at [place]:  -patient is lethargic, but communicates she is comfortable at this time  -sister in law at bedside,  spoke to daughter Olivia Mackie by telephone and family has made decision that a hospice facility  would be best for this patient at this time, will write for choice.  All understand that once discharged there will be no further artifical hydration, IV antibiotics or re hospitalizations  -discussed natural trajectory and expectations at EOL  -focus of care is comfort,quality and dignity    Objective: Allergies  Allergen Reactions  . Epinephrine Anaphylaxis  . Codeine Other (See Comments)    "ill"  . Demerol [Meperidine] Other (See Comments)    "ill"  . Latex Itching    Says gloves "used" to cause itching.. Not sure today if problem persists.  Clancy Gourd [Nitrofurantoin Macrocrystal] Other (See Comments)    "made her feel reclusive"  . Micardis [Telmisartan] Other (See Comments)    "didn't move for 6 hours"  . Protonix [Pantoprazole Sodium] Other (See Comments)    "ill"  . Sulfa Antibiotics Other (See Comments)    "blind spell"   Scheduled Meds: . docusate sodium  100 mg Oral BID  . famotidine (PEPCID) IV  20 mg Intravenous Q24H   Continuous Infusions: . sodium chloride 75 mL/hr at 11/09/14 1602   PRN Meds:.acetaminophen **OR** acetaminophen, HYDROmorphone (DILAUDID) injection, LORazepam, ondansetron (ZOFRAN) IV, promethazine, sodium chloride  BP 135/74 mmHg  Pulse 96  Temp(Src) 97.4 F (36.3 C) (Oral)  Resp 22  SpO2 96%   PPS:20 %     Intake/Output Summary (Last 24 hours) at 11/10/14 1126 Last data filed at 11/10/14 0728  Gross per 24 hour  Intake 4001.67 ml  Output    625 ml  Net 3376.67 ml       Physical Exam:  General: very weak, ill appearing HEENT:  moist buccal membranes  Abdomen: diffuse tenderness, noted stoma/unremarkable   Labs: CBC    Component Value Date/Time   WBC  5.5 11/09/2014 0823   WBC 8.9 11/07/2014 1430   RBC 3.16* 11/09/2014 0823   RBC 3.37* 11/07/2014 1430   HGB 9.1* 11/09/2014 0823   HGB 9.6* 11/07/2014 1430   HCT 28.9* 11/09/2014 0823   HCT 29.7* 11/07/2014 1430   PLT 312 11/09/2014 0823   PLT 312 11/07/2014 1430   MCV 91.5 11/09/2014 0823   MCV 88.0 11/07/2014 1430   MCH 28.8 11/09/2014 0823   MCH 28.4 11/07/2014 1430   MCHC 31.5 11/09/2014 0823   MCHC 32.2 11/07/2014 1430   RDW 15.5 11/09/2014 0823   RDW 15.2* 11/07/2014 1430   LYMPHSABS 0.2* 11/07/2014 1430   LYMPHSABS 0.6* 08/19/2014 0426   MONOABS 0.5 11/07/2014 1430   MONOABS 1.0 08/19/2014 0426   EOSABS 0.0 11/07/2014 1430   EOSABS 0.0 08/19/2014 0426   BASOSABS 0.0 11/07/2014 1430   BASOSABS 0.0 08/19/2014 0426    BMET    Component Value Date/Time   NA 145 11/10/2014 0800   NA 141 11/07/2014 1430   K 3.8 11/10/2014 0800   K 4.0 11/07/2014 1430   CL 110 11/10/2014 0800   CO2 19 11/10/2014 0800   CO2 24 11/07/2014 1430   GLUCOSE 88 11/10/2014 0800   GLUCOSE 101 11/07/2014 1430   BUN 57* 11/10/2014 0800   BUN 45.0* 11/07/2014 1430   CREATININE 4.31* 11/10/2014 0800   CREATININE 3.8* 11/07/2014 1430   CALCIUM 8.1* 11/10/2014 0800  CALCIUM 9.0 11/07/2014 1430   GFRNONAA 10* 11/10/2014 0800   GFRAA 11* 11/10/2014 0800    CMP     Component Value Date/Time   NA 145 11/10/2014 0800   NA 141 11/07/2014 1430   K 3.8 11/10/2014 0800   K 4.0 11/07/2014 1430   CL 110 11/10/2014 0800   CO2 19 11/10/2014 0800   CO2 24 11/07/2014 1430   GLUCOSE 88 11/10/2014 0800   GLUCOSE 101 11/07/2014 1430   BUN 57* 11/10/2014 0800   BUN 45.0* 11/07/2014 1430   CREATININE 4.31* 11/10/2014 0800   CREATININE 3.8* 11/07/2014 1430   CALCIUM 8.1* 11/10/2014 0800   CALCIUM 9.0 11/07/2014 1430   PROT 7.1 11/08/2014 0525   PROT 7.5 11/07/2014 1430   ALBUMIN 3.3* 11/08/2014 0525   ALBUMIN 3.0* 11/07/2014 1430   AST 22 11/08/2014 0525   AST 23 11/07/2014 1430   ALT 11  11/08/2014 0525   ALT 10 11/07/2014 1430   ALKPHOS 61 11/08/2014 0525   ALKPHOS 73 11/07/2014 1430   BILITOT 0.9 11/08/2014 0525   BILITOT 0.42 11/07/2014 1430   GFRNONAA 10* 11/10/2014 0800   GFRAA 11* 11/10/2014 0800     Assessment and Plan: 3. Code Status:  DNR/DNI 4. Symptom Control:  1. Anxiety/Agitation: Decreased Ativan to 0.5 mg every 4 hrs prn 2. Pain: Dilaudid IV 1-2 mg every 4 hrs prn  - foley cath for increased pain trying to void 3. Nausea/Vomiting: Zofran 8 mg IV every 8 hrs prn 5. Psycho/Social: Emotional support offered to patient and her family.  6. Disposition:  Hopeful for hospice facility will write for choice    Time In Time Out Total Time Spent with Patient Total Overall Time  1015 1050 35 min 35 min    Greater than 50%  of this time was spent counseling and coordinating care related to the above assessment and plan.  Wadie Lessen NP  Palliative Medicine Team Team Phone # [phone] Pager [phone]   1

## 2014-11-10 NOTE — Progress Notes (Signed)
MEDICAL ONCOLOGY 11/10/2014, 7:54 AM  Hospital day 4 Antibiotics:  Chemotherapy: #2 doxil 12-28, last avastin 12-28  EMR reviewed, patient seen and examined. Granddaughter sleeping at bedside. Discussed with unit RN  Subjective: Foley uncomfortable, patient does not know reason for placement, tho RN tells me that family requested as she was up to void hourly. Denies vomiting, wants to continue liquids but would try full liquids. Irrigation of ostomy not uncomfortable, has had some output since then. Pain controlled with IV dilaudid prn, which she used 4x yesterday.  I tried to discuss resuming palliative RT to pelvis, which had begun day of admission, vs managing with pain medication ongoing instead of attempting RT -- however patient too drowsy to follow conversation. Note primary radiation oncologist Dr Pablo Ledger is away this week, Dr Sondra Come covering but has not met her.  Ostomy irrigated and disimpacted of 2 hard balls of stool yesterday; WOC note mentions leaving equipment as this may be needed again.   Tho patient/ daughter had declined [place] meeting when they spoke with me yesterday, I see that this meeting was accomplished and that daughter is to visit Hospice inpatient facility today. I would be in full agreement with hospice inpatient facility if this is the choice. Note 70 yo granddaughter lives at home. If plan is Hospice inpatient facility, pain management without putting her thru pelvic RT seems appropriate to me.  Objective: Vital signs in last 24 hours: Blood pressure 135/74, pulse 96, temperature 97.4 F (36.3 C), temperature source Oral, resp. rate 22, SpO2 96 %.   Intake/Output from previous day: 01/13 0701 - 01/14 0700 In: 4001.7 [I.V.:3851.7; IV Piggyback:150] Out: 375 [Urine:375] Intake/Output this shift: Total I/O In: -  Out: 350 [Urine:350]  Physical exam: Looks comfortable lying at 30 degrees on RA, mouth breathing, very drowsy but rouses to voice  and responds appropriately, briefly. Pale, not icteric. Mouth somewhat dry. IVF infusing by PAC at 75. Lungs without wheezes or rales anteriorly. Heart RRR. Ostomy bag tight with air and some soft stool, stoma unchanged. Abdomen a little less distended. Foley with brownish tan urine in tubing and small amount in bag. LE without increased edema, no cords or tenderness. Moves all extremities. Feet warm.  Lab Results:  Recent Labs  11/08/14 0525 11/09/14 0823  WBC 6.8 5.5  HGB 9.1* 9.1*  HCT 28.9* 28.9*  PLT 298 312   BMET  Recent Labs  11/08/14 0525 11/09/14 0823  NA 142 145  K 4.0 4.0  CL 104 110  CO2 22 20  GLUCOSE 78 77  BUN 51* 58*  CREATININE 4.03* 4.09*  CALCIUM 8.2* 8.2*   BMET ordered now.   Studies/Results: Dg Abd Portable 1v  11/08/2014   CLINICAL DATA:  Evaluate for bowel obstruction  EXAM: PORTABLE ABDOMEN - 1 VIEW  COMPARISON:  Correlation with CT from 10/19/2014  FINDINGS: A left-sided ostomy is present. Metallic clips and surgical suture material is noted within the pelvis. Right upper quadrant cholecystectomy clips are present.  Focally dilated small bowel loops are noted within the left upper quadrant measuring up to 3.5 cm. The colon is relatively decompressed. A moderate amount of retained stool is noted within the residual colon.  Degenerative changes of the hips and spine are noted.  IMPRESSION: 1. Focally prominent small-bowel loops within the left upper quadrant are nonspecific for obstruction. Follow-up is recommended.  2. Moderate amount of retained stool.   Electronically Signed   By: Rosemarie Ax   On: 11/08/2014  10:24     Assessment/Plan: 1. 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 Primary goal now is control of symptoms, as  unfortunately situation is rapidly worsening despite interventions that have been attempted. Some better with IV hydration, IV dilaudid prn, IV antiemetics prn, IV pepcid since admission. Repeat BMET pending, no improvement in labs as of 1-13. No further chemo/avastin.Holding RT still: options are either to try to resume palliative RT to pelvis or just plan to manage with pain medication from here.  IV dilaudid via PAC/pump when DC with hospice likely will be best for pain control. 2.DNR at patient's request. Hospice requested for assistance after DC home. Note she lives at home with daughter Olivia Mackie, granddaughter Jerene Pitch ~ 48 yrs old and granddaughter Stanton Kidney age 67. Sister in law very helpful.  3.Acute renal failure nonoliguric: in part prerenal with dehydration on admission, have ordered BMET now as this may make a difference in IV hydration. Uric acid nephrolithiasis, known to Dr Louis Meckel 4.PAC in 5.laparoscopic cholecystectomy 10-2013 6.hypothyroid on replacement 7.hx HTN 8.flu vaccine done 9.blood loss anemia + chemo anemia: Hgb stable. OK with minimal activity 10. Diverting colostomy done at time of gyn onc surgery: had had some output since irrigation yesterday. Have ordered prn irrigation as discussed with nursing now. 11. Multiple drug intolerances reported, some of which may not be the drugs.  Will follow with you. Please page between my rounds if needed.  Pager [place]

## 2014-11-10 NOTE — Progress Notes (Signed)
Clinical Social Work Department BRIEF PSYCHOSOCIAL ASSESSMENT 11/10/2014  Patient:  [name], [name]     Account Number:  [id number]     Admit date:  11/07/2014  Clinical Social Worker:  Earlie Server  Date/Time:  11/10/2014 12:15 PM  Referred by:  Physician  Date Referred:  11/10/2014 Referred for  Residential hospice placement   Other Referral:   Interview type:  Family Other interview type:   PMT NP reports dtr is making decisions re: DC plans on patient's behalf.    PSYCHOSOCIAL DATA Living Status:  FAMILY Admitted from facility:   Level of care:   Primary support name:  [name] Primary support relationship to patient:  CHILD, ADULT Degree of support available:   Strong    CURRENT CONCERNS Current Concerns  Post-Acute Placement   Other Concerns:    SOCIAL WORK ASSESSMENT / PLAN CSW received referral in order to assist with DC planning. CSW reviewed chart and spoke with PMT NP prior to assessing patient. Patient's dtr is primary decision maker and has been making plans about DC.    CSW spoke with dtr via phone. CSW introduced myself and explained role. Dtr reports she has had several conversations with PMT and feels that patient would be best suited for residential hospice. CSW offered hospice choice and explained process. Dtr reports that she toured [facility] and was hopeful for placement there. CSW explained that referral could be made to [facility] but if they do not have available space then dtr would need to have alternative option. Dtr reports she would not want patient to return to SNF but wants to review hospice list to determine which facility would be closest to family to visit.    CSW made referral to [facility] and will follow up with dtr to determine if they can accept or if another facility is needed. CSW will continue to follow.   Assessment/plan status:  Psychosocial Support/Ongoing Assessment of Needs Other assessment/ plan:   Information/referral  to community resources:   Hospice choice    PATIENT'S/FAMILY'S RESPONSE TO PLAN OF CARE: Patient has been deferring decision making to dtr. CSW spoke with dtr who is engaged in assessment and agreeable to talk with CSW. Dtr reports that this has been a long process but she just wants to make sure patient is receiving the best care possible. Dtr agreeable to [facility] and hopeful that place is available so that she can visit patient often. Dtr thanked CSW for call and will await follow up.       [place], [place] [phone]

## 2014-11-10 NOTE — Progress Notes (Signed)
[facility] PROGRESS NOTE  [name] ZOX:[id number] DOB: February 14, 1946 DOA: 11/07/2014  PCP: Dwan Bolt, MD  Brief HPI: 69 year old with history of carcinoma sarcoma of ovary. Widely metastatic disease. Presented with nausea and vomiting. She also was noted to have acute renal failure. She also had lower abdominal pain.  Past medical history:  Past Medical History  Diagnosis Date  . Thyroid disease   . Hypertension   . Anemia   . Asthma   . Hiatal hernia   . Cancer 02/2014    ovarian ca  . Renal disorder     Consultants: Oncology. Palliative medicine  Procedures: None  Antibiotics: None  Subjective: Patient sleeping but arousable. Foley was placed yesterday due to frequent urination and pain associated with it.   Objective: Vital Signs  Filed Vitals:   11/09/14 0535 11/09/14 1444 11/09/14 2127 11/10/14 0727  BP: 132/68 136/61 140/76 135/74  Pulse: 93 93 94 96  Temp: 97.8 F (36.6 C) 98.1 F (36.7 C) 98 F (36.7 C) 97.4 F (36.3 C)  TempSrc: Oral Oral Oral Oral  Resp: $Remo'16 16 16 22  'StYNx$ SpO2: 94% 93% 100% 96%    Intake/Output Summary (Last 24 hours) at 11/10/14 1043 Last data filed at 11/10/14 8119  Gross per 24 hour  Intake 4001.67 ml  Output    625 ml  Net 3376.67 ml   There were no vitals filed for this visit.  General appearance: appears stated age, no distress and slowed mentation Resp: clear to auscultation bilaterally Cardio: regular rate and rhythm, S1, S2 normal, no murmur, click, rub or gallop GI: soft, mildly tender in the lower abdomen. There is a colostomy.   Lab Results:  Basic Metabolic Panel:  Recent Labs Lab 11/07/14 1430 11/08/14 0525 11/09/14 0823 11/10/14 0800  NA 141 142 145 145  K 4.0 4.0 4.0 3.8  CL  --  104 110 110  CO2 $Re'24 22 20 19  'XVU$ GLUCOSE 101 78 77 88  BUN 45.0* 51* 58* 57*  CREATININE 3.8* 4.03* 4.09* 4.31*  CALCIUM 9.0 8.2* 8.2* 8.1*   Liver Function Tests:  Recent Labs Lab 11/07/14 1430  11/08/14 0525  AST 23 22  ALT 10 11  ALKPHOS 73 61  BILITOT 0.42 0.9  PROT 7.5 7.1  ALBUMIN 3.0* 3.3*   CBC:  Recent Labs Lab 11/07/14 1430 11/08/14 0525 11/09/14 0823  WBC 8.9 6.8 5.5  NEUTROABS 8.2*  --   --   HGB 9.6* 9.1* 9.1*  HCT 29.7* 28.9* 28.9*  MCV 88.0 90.9 91.5  PLT 312 298 312    Studies/Results: No results found.  Medications:  Scheduled: . docusate sodium  100 mg Oral BID  . famotidine (PEPCID) IV  20 mg Intravenous Q24H  . levothyroxine  75 mcg Oral QAC breakfast  . tamsulosin  0.4 mg Oral Daily   Continuous: . sodium chloride 75 mL/hr at 11/09/14 1602   JYN:[id number] **OR** acetaminophen, HYDROmorphone (DILAUDID) injection, LORazepam, ondansetron (ZOFRAN) IV, promethazine, sodium chloride  Assessment/Plan:  Principal Problem:   AKI (acute kidney injury) Active Problems:   Essential hypertension, benign   Hypothyroidism   GERD (gastroesophageal reflux disease)   Ovarian carcinosarcoma   Uric acid nephrolithiasis   Nausea with vomiting   Hematuria   DNR (do not resuscitate)   Palliative care encounter    Met Ovarian Carcinosarcoma T3c,NX,M1 Symptoms likely due to progression of disease. Renal failure, likely due to obstruction from tumor. Per oncology she has failed treatment. Currently main goal  will be symptom management and palliation. Family and patient agreeable to residential hospice. Continue pain control. Discontinue unnecessary medications.  AKI Secondary to obstruction by tumor. No good options. Stop checking labs.  Hypothyroid Continue levothyroxine for now.  DVT Prophylaxis: Change to SCDs  Code Status: DO NOT RESUSCITATE  Family Communication: Discussed with her granddaughter at bedside  Disposition Plan: Family leaning to his residential hospice. Social worker is aware.    LOS: 3 days   [facility] Pager [phone] 11/10/2014, 10:43 AM  If 7PM-7AM, please contact night-coverage at  [email or web address], password [facility]

## 2014-11-10 NOTE — Progress Notes (Signed)
CARE MANAGEMENT NOTE 11/10/2014  Patient:  [name], [name]   Account Number:  [id number]  Date Initiated:  11/09/2014  Documentation initiated by:  Edwyna Shell  Subjective/Objective Assessment:   69 yo female admitted with AKI from home     Action/Plan:   discharge planning   Anticipated DC Date:  11/10/2014   Anticipated DC Plan:  [place] referral  Clinical Social Worker      DC Planning Services  CM consult      Choice offered to / List presented to:             Status of service:  In process, will continue to follow Medicare Important Message given?   (If response is "NO", the following Medicare IM given date fields will be blank) Date Medicare IM given:   Medicare IM given by:   Date Additional Medicare IM given:   Additional Medicare IM given by:    Discharge Disposition:  [place]  Per UR Regulation:    If discussed at Long Length of Stay Meetings, dates discussed:    Comments:  11/10/14 Edwyna Shell RN BSN CM [phone] Patient and daughter is preferring an inpt hospice facility per Palliative Care team consult, CSW aware.  11/09/14 Edwyna Shell RN BSn CM [phone] Spoke with patient daughter [name] and she stated that the patient and her live together and the patient was using a walker in the home prior to hospitalization. Discussed the follow up meeting with PCT and the daughter stated that she feels that the best plan will be for the patient to return home with hospice but she has a lot of questions around this. Encouraged the meeting with PCT for [name] to follow up with discharge planning needs. [name] stated that she understood and requesting to speak with PCT, request communicated to [facility], NP PCT. Will continue to follow for DC planning needs.

## 2014-11-10 NOTE — Consult Note (Signed)
[facility] Liaison: [facility] room available for patient 11/11/14. Daughter completed transfer paperwork this afternoon. Dr. Marko Plume to attend with Dr. Tomasa Hosteller for symptom management. Please fax discharge summary to [phone]. RN please call report to [phone]. Please arrange for patient to arrive before noon if possible. Thank you. Erling Conte LCSW [phone]

## 2014-11-11 ENCOUNTER — Ambulatory Visit: Payer: Medicare Other

## 2014-11-11 MED ORDER — HYDROMORPHONE HCL 1 MG/ML IJ SOLN
1.0000 mg | INTRAMUSCULAR | Status: DC | PRN
  Administered 2014-11-11 (×3): 2 mg via INTRAVENOUS
  Filled 2014-11-11 (×3): qty 2

## 2014-11-11 MED ORDER — LORAZEPAM 2 MG/ML IJ SOLN: 0.5000 mg | INTRAMUSCULAR | Status: AC | PRN

## 2014-11-11 MED ORDER — ACETAMINOPHEN 325 MG PO TABS: 650.0000 mg | ORAL_TABLET | Freq: Four times a day (QID) | ORAL | Status: AC | PRN

## 2014-11-11 MED ORDER — SODIUM CHLORIDE 0.9 % IJ SOLN
10.0000 mL | INTRAMUSCULAR | Status: DC | PRN
  Administered 2014-11-11: 10 mL

## 2014-11-11 MED ORDER — DSS 100 MG PO CAPS: 100.0000 mg | ORAL_CAPSULE | Freq: Two times a day (BID) | ORAL | Status: AC

## 2014-11-11 MED ORDER — HYDROMORPHONE HCL 1 MG/ML IJ SOLN: 1.0000 mg | INTRAMUSCULAR | Status: AC | PRN

## 2014-11-11 MED ORDER — ONDANSETRON 8 MG/NS 50 ML IVPB: 8.0000 mg | Freq: Three times a day (TID) | INTRAVENOUS | Status: AC | PRN

## 2014-11-11 NOTE — Discharge Summary (Signed)
[facility]  Physician Discharge Summary   Patient ID: [name] MRN: [id number] DOB/AGE: 1945/12/03 69 y.o.  Admit date: 11/07/2014 Discharge date: 11/11/2014  PCP: Dwan Bolt, MD  DISCHARGE DIAGNOSES:  Principal Problem:   AKI (acute kidney injury) Active Problems:   Essential hypertension, benign   Hypothyroidism   GERD (gastroesophageal reflux disease)   Ovarian carcinosarcoma   Uric acid nephrolithiasis   Nausea with vomiting   Hematuria   DNR (do not resuscitate)   Palliative care encounter   RECOMMENDATIONS FOR OUTPATIENT FOLLOW UP: 1. Patient being discharged to residential hospice  DISCHARGE CONDITION: poor  Diet recommendation: As tolerated  INITIAL HISTORY: 69 year old with history of carcinoma sarcoma of ovary. Widely metastatic disease. Presented with nausea and vomiting. She also was noted to have acute renal failure. She also had lower abdominal pain.  Consultations:  Oncology: Dr. Marko Plume  Palliative Care  Procedures:  None  HOSPITAL COURSE:   Met Ovarian Carcinosarcoma T3c,NX,M1 Her symptoms and renal failure is due to progression of her cancer. Renal failure is likely due to obstruction from tumor. Per oncology she has failed treatment. Currently main goal will be symptom management and palliation. Family and patient agreeable to residential hospice. Continue pain control. Discontinue unnecessary medications. May consider continuous dilaudid infusion at the hospice facility.  AKI Secondary to obstruction by tumor. No good options. As above.  Will stop all other medications. Can be transported to residential hospice.   PERTINENT LABS:  The results of significant diagnostics from this hospitalization (including imaging, microbiology, ancillary and laboratory) are listed below for reference.    Microbiology: Recent Results (from the past 240 hour(s))  Urine Culture     Status: None   Collection Time: 11/07/14  2:29  PM  Result Value Ref Range Status   Urine Culture, Routine Culture, Urine  Final    Comment: Final - ===== COLONY COUNT: ===== 25,000 COLONIES/ML Yeast      Labs: Basic Metabolic Panel:  Recent Labs Lab 11/07/14 1430 11/08/14 0525 11/09/14 0823 11/10/14 0800  NA 141 142 145 145  K 4.0 4.0 4.0 3.8  CL  --  104 110 110  CO2 _0 GLUCOSE 101 78 77 88  BUN 45.0* 51* 58* 57*  CREATININE 3.8* 4.03* 4.09* 4.31*  CALCIUM 9.0 8.2* 8.2* 8.1*   Liver Function Tests:  Recent Labs Lab 11/07/14 1430 11/08/14 0525  AST 23 22  ALT 10 11  ALKPHOS 73 61  BILITOT 0.42 0.9  PROT 7.5 7.1  ALBUMIN 3.0* 3.3*   CBC:  Recent Labs Lab 11/07/14 1430 11/08/14 0525 11/09/14 0823  WBC 8.9 6.8 5.5  NEUTROABS 8.2*  --   --   HGB 9.6* 9.1* 9.1*  HCT 29.7* 28.9* 28.9*  MCV 88.0 90.9 91.5  PLT 312 298 312    IMAGING STUDIES Dg Abd Portable 1v  11/08/2014   CLINICAL DATA:  Evaluate for bowel obstruction  EXAM: PORTABLE ABDOMEN - 1 VIEW  COMPARISON:  Correlation with CT from 10/19/2014  FINDINGS: A left-sided ostomy is present. Metallic clips and surgical suture material is noted within the pelvis. Right upper quadrant cholecystectomy clips are present.  Focally dilated small bowel loops are noted within the left upper quadrant measuring up to 3.5 cm. The colon is relatively decompressed. A moderate amount of retained stool is noted within the residual colon.  Degenerative changes of the hips and spine are noted.  IMPRESSION: 1. Focally prominent small-bowel loops within the left upper  quadrant are nonspecific for obstruction. Follow-up is recommended.  2. Moderate amount of retained stool.   Electronically Signed   By: Kenneth  Crosby   On: 11/08/2014 10:24    DISCHARGE EXAMINATION: Filed Vitals:   11/09/14 1444 11/09/14 2127 11/10/14 0727 11/11/14 0447  BP: 136/61 140/76 135/74 129/70  Pulse: 93 94 96 105  Temp: 98.1 F (36.7 C) 98 F (36.7 C) 97.4 F (36.3 C) 98.6 F (37 C)   TempSrc: Oral Oral Oral Oral  Resp: 16 16 22 16  SpO2: 93% 100% 96% 94%   General appearance: fatigued and no distress  DISPOSITION: Residential Hospice: [facility]   ALLERGIES:  Allergies  Allergen Reactions  . Epinephrine Anaphylaxis  . Codeine Other (See Comments)    "ill"  . Demerol [Meperidine] Other (See Comments)    "ill"  . Latex Itching    Says gloves "used" to cause itching.. Not sure today if problem persists.  . Macrodantin [Nitrofurantoin Macrocrystal] Other (See Comments)    "made her feel reclusive"  . Micardis [Telmisartan] Other (See Comments)    "didn't move for 6 hours"  . Protonix [Pantoprazole Sodium] Other (See Comments)    "ill"  . Sulfa Antibiotics Other (See Comments)    "blind spell"    Current Discharge Medication List    START taking these medications   Details  acetaminophen (TYLENOL) 325 MG tablet Take 2 tablets (650 mg total) by mouth every 6 (six) hours as needed for mild pain (or Fever >/= 101).    docusate sodium 100 MG CAPS Take 100 mg by mouth 2 (two) times daily. Qty: 10 capsule, Refills: 0   Associated Diagnoses: Peritoneal carcinomatosis    HYDROmorphone (DILAUDID) 1 MG/ML injection Inject 1-2 mLs (1-2 mg total) into the vein every 2 (two) hours as needed for severe pain. Qty: 1 mL, Refills: 0   Associated Diagnoses: Ovarian carcinosarcoma, unspecified laterality    LORazepam (ATIVAN) 2 MG/ML injection Inject 0.25 mLs (0.5 mg total) into the vein every 4 (four) hours as needed for anxiety or sedation (nausea). Qty: 1 mL, Refills: 0   Associated Diagnoses: Ovarian carcinosarcoma, unspecified laterality    ondansetron (ZOFRAN) 8 MG/50ML SOLN Inject 8 mg into the vein every 8 (eight) hours as needed.   Associated Diagnoses: Ovarian carcinosarcoma, unspecified laterality      STOP taking these medications     amLODipine (NORVASC) 5 MG tablet      BIOTIN PO      HYDROmorphone (DILAUDID) 2 MG tablet      levothyroxine  (SYNTHROID, LEVOTHROID) 75 MCG tablet      lidocaine-prilocaine (EMLA) cream      loratadine (CLARITIN) 10 MG tablet      metoprolol succinate (TOPROL-XL) 50 MG 24 hr tablet      Multiple Vitamin (MULTIVITAMIN WITH MINERALS) TABS tablet      ondansetron (ZOFRAN) 8 MG tablet      OVER THE COUNTER MEDICATION      promethazine (PHENERGAN) 25 MG tablet      ranitidine (ZANTAC) 75 MG tablet      tamsulosin (FLOMAX) 0.4 MG CAPS capsule      allopurinol (ZYLOPRIM) 100 MG tablet      promethazine (PHENERGAN) 25 MG suppository        Follow-up Information    Follow up with KOHUT,WALTER DENNIS, MD.   Specialty:  Endocrinology   Why:  As needed   Contact information:   [address] [place] [address] [phone]         TOTAL DISCHARGE TIME: 35 mins  [facility] Pager [phone]  11/11/2014, 10:28 AM

## 2014-11-11 NOTE — Progress Notes (Signed)
Medical Oncology  Plans for transfer to [facility] today noted. Patient seen, sister in law and another family member at bedside. Patient moaning and restless, pain medication requested now and I have asked RN to premed transport to [facility] with dilaudid 2 mg IV; can also give ativan as premed for transport if needed.  She may need dilaudid qtt after transfer.   Please page me if I can help today L.Marko Plume  MD   [phone] pager

## 2014-11-11 NOTE — Discharge Instructions (Signed)
Hospice °Hospice is a service that is designed to provide people who are terminally ill and their families with medical, spiritual, and psychological support. Its aim is to improve your quality of life by keeping you as alert and comfortable as possible. Hospice is performed by a team of health care professionals and volunteers who: °· Help keep you comfortable. Hospice can be provided in your home or in a homelike setting. The hospice staff works with your family and friends to help meet your needs. You will enjoy the support of loved ones by receiving much of your basic care from family and friends. °· Provide pain relief and manage your symptoms. The staff supply all necessary medicines and equipment. °· Provide companionship when you are alone. °· Allow you and your family to rest. They may do light housekeeping, prepare meals, and run errands. °· Provide counseling. They will make sure your emotional, spiritual, and social needs and those of your family are being met. °· Provide spiritual care. Spiritual care is individualized to meet your needs and your family's needs. It may involve helping you look at what death means to you, say goodbye, or perform a specific religious ceremony or ritual. °Hospice teams often include: °· A nurse. °· A doctor. °· Social workers. °· Religious leaders (such as a chaplain). °· Trained volunteers. °WHEN SHOULD HOSPICE CARE BEGIN? °Most people who use hospice are believed to have fewer than 6 months to live. Your family and health care providers can help you decide when hospice services should begin. If your condition improves, you may discontinue the program. °WHAT SHOULD I CONSIDER BEFORE SELECTING A PROGRAM? °Most hospice programs are run by nonprofit, independent organizations. Some are affiliated with hospitals, nursing homes, or home health care agencies. Hospice programs can take place in the home or at a hospice center, hospital, or skilled nursing facility. When choosing  a hospice program, ask the following questions: °· What services are available to me? °· What services are offered to my loved ones? °· How involved are my loved ones? °· How involved is my health care provider? °· Who makes up the hospice care team? How are they trained or screened? °· How will my pain and symptoms be managed? °· If my circumstances change, can the services be provided in a different setting, such as my home or in the hospital? °· Is the program reviewed and licensed by the state or certified in some other way? °WHERE CAN I LEARN MORE ABOUT HOSPICE? °You can learn about existing hospice programs in your area from your health care providers. You can also read more about hospice online. The websites of the following organizations contain helpful information: °· The National Hospice and Palliative Care Organization (NHPCO). °· The Hospice Association of America (HAA). °· The Hospice Education Institute. °· The American Cancer Society (ACS). °· Hospice Net. °Document Released: 01/31/2004 Document Revised: 10/19/2013 Document Reviewed: 08/24/2013 °ExitCare® Patient Information ©2015 Exit[employer]. This information is not intended to replace advice given to you by your health care provider. Make sure you discuss any questions you have with your health care provider. ° °

## 2014-11-11 NOTE — Progress Notes (Signed)
Patient discharged to [facility], alert and oriented, discharge instruction package given for delivery to Hospice, patient instable condition at this time

## 2014-11-11 NOTE — Progress Notes (Signed)
Clinical Social Work  MD completed DC summary which was faxed to [employer]. RN to call report. CSW prepared DC packet with DC summary, PTAR forms and DNR form included. CSW went to room and informed patient and family of DC plans. Dtr asked to speak with CSW outside in the hallway. When dtr exited room she began crying and reports she feels she is going to have a mental breakdown. Dtr reports she is caring for family and is overwhelmed with caring for patient. CSW praised dtr for making decisions for patient and being at the hospital to support patient. CSW and dtr spoke about maintaining self care and to ensure that dtr is using resources at hospice such as therapy. Dtr reports she had a history of anxiety and depression and that OBGYN prescribed medication. Dtr reports she will call MD to see if she needs another prescription. CSW encouraged dtr to follow up on her own MH needs and provided outpatient resources in case dtr needed additional follow up care. Dtr thanked CSW for information and for taking time to listen to her concerns. Dtr reports she feels she is making the best decisions for patient but still doesn't make EOL care for patient any easier.  CSW arranged PTAR for 11am. PTAR request #: [id number].  CSW is signing off but available if needed.  [place], [place] [phone]

## 2014-11-14 ENCOUNTER — Ambulatory Visit: Payer: Medicare Other

## 2014-11-14 ENCOUNTER — Telehealth: Payer: Self-pay

## 2014-11-14 NOTE — Telephone Encounter (Signed)
Received a faxed message informing Dr. Marko Plume that [name] passed away 2014/11/22 at 1:32 pm. Dr. Marko Plume given this message.

## 2014-11-15 ENCOUNTER — Ambulatory Visit: Payer: Medicare Other

## 2014-11-15 NOTE — Progress Notes (Signed)
  Radiation Oncology         [phone] ________________________________  Name: SHAMMARA JARRETT MRN: [id number]  Date: 11/07/2014  DOB: August 22, 1946  End of Treatment Note  Diagnosis:  Metastatic ovarian cancer      Indication for treatment:  Palliatve       Radiation treatment dates:   11/07/2014  Site/dose:  Pelvis / 2.5 Gy   Beams/energy:   AP/PA with 15 MV photons  Narrative:  Ms. Hakim was admitted immediately following her port films and first treatment. Hospice was consulted and she died in the hospital.   ------------------------------------------------  Thea Silversmith, MD

## 2014-11-16 ENCOUNTER — Ambulatory Visit: Payer: Medicare Other

## 2014-11-17 ENCOUNTER — Ambulatory Visit: Payer: Medicare Other

## 2014-11-18 ENCOUNTER — Ambulatory Visit: Payer: Medicare Other

## 2014-11-18 NOTE — Progress Notes (Signed)
Radiation Oncology         [phone]) [phone] ________________________________  Name: [name]      MRN: [id number]          Date: 11/07/2014              DOB: 03-Jul-1946  Optical Surface Tracking Plan:  Since intensity modulated radiotherapy (IMRT) and 3D conformal radiation treatment methods are predicated on accurate and precise positioning for treatment, intrafraction motion monitoring is medically necessary to ensure accurate and safe treatment delivery.  The ability to quantify intrafraction motion without excessive ionizing radiation dose can only be performed with optical surface tracking. Accordingly, surface imaging offers the opportunity to obtain 3D measurements of patient position throughout IMRT and 3D treatments without excessive radiation exposure.  I am ordering optical surface tracking for this patient's upcoming course of radiotherapy. ________________________________ Signature   Reference:   Ursula Alert, J, et al. Surface imaging-based analysis of intrafraction motion for breast radiotherapy patients.Journal of [place], n. 6, nov. 2014. ISSN [id number].   Available at: <http://www.jacmp.org/index.php/jacmp/article/view/4957>.

## 2014-11-18 NOTE — Progress Notes (Signed)
  Radiation Oncology         [phone]) [phone] ________________________________  Name: [name] MRN: [id number]  Date: 11/07/2014  DOB: 03-Aug-1946  Simulation Verification Note  Status: Outpatient  NARRATIVE: The patient was brought to the treatment unit and placed in the planned treatment position. The clinical setup was verified. Then port films were obtained and uploaded to the radiation oncology medical record software.  The treatment beams were carefully compared against the planned radiation fields. The position location and shape of the radiation fields was reviewed. The targeted volume of tissue appears appropriately covered by the radiation beams. Organs at risk appear to be excluded as planned.  Based on my personal review, I approved the simulation verification. The patient's treatment will proceed as planned.  ------------------------------------------------  Thea Silversmith, MD

## 2014-11-21 ENCOUNTER — Ambulatory Visit: Payer: Medicare Other

## 2014-11-21 ENCOUNTER — Other Ambulatory Visit: Payer: Self-pay | Admitting: Oncology

## 2014-11-21 NOTE — Progress Notes (Signed)
MEDICAL ONCOLOGY  Patient died at [facility] 4[facility]25, of complications of rapidly progressive metastatic uterine carcinosarcoma, complicated by acute renal failure.  Sympathy letter written to daughter.  Will notify other MDs, as their assistance with her care was greatly appreciated.  Godfrey Pick, MD

## 2014-11-22 ENCOUNTER — Ambulatory Visit: Payer: Medicare Other

## 2014-11-23 ENCOUNTER — Ambulatory Visit: Payer: Medicare Other

## 2014-11-24 ENCOUNTER — Ambulatory Visit: Payer: Medicare Other

## 2014-11-25 ENCOUNTER — Ambulatory Visit: Payer: Medicare Other

## 2014-11-25 ENCOUNTER — Other Ambulatory Visit: Payer: Self-pay | Admitting: Oncology

## 2014-11-26 ENCOUNTER — Ambulatory Visit: Payer: Medicare Other

## 2014-11-28 ENCOUNTER — Ambulatory Visit: Payer: Medicare Other

## 2014-11-28 DEATH — deceased

## 2015-01-09 IMAGING — CT CT ABD-PELV W/ CM
2 of 5 series · 15 of 46 positions shown, 17 images · IV contrast (OMNIPAQUE)
Comparison: CT of the abdomen and pelvis 08/19/2014. CT of the
abdomen and pelvis 08/15/2014.

CLINICAL DATA: 68-year-old female with history of right ovarian
carcinosarcoma diagnosed in January 2014. Undergoing chemotherapy.

EXAM:
CT ABDOMEN AND PELVIS WITH CONTRAST
TECHNIQUE: Multidetector CT imaging of the abdomen and pelvis was performed
using the standard protocol following bolus administration of
intravenous contrast.
CONTRAST:  100mL OMNIPAQUE IOHEXOL 300 MG/ML  SOLN

[Series 2: rtn a/p with · axial · 0.84mm/px · z∈[+1430,+1830]mm · 12 of 90 slices shown, 14 images]
[im 5/90  soft-tissue]
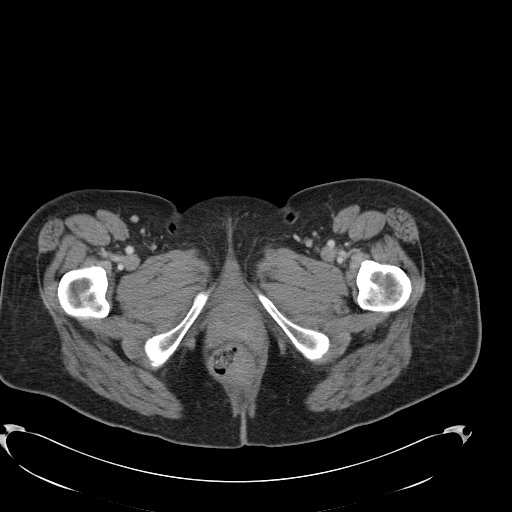
[im 5/90  bone]
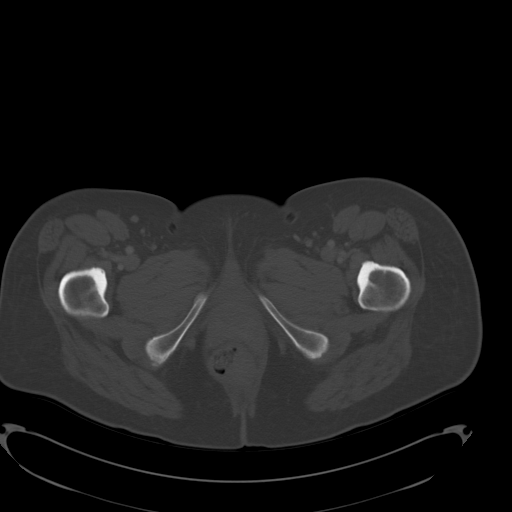
[im 15/90  soft-tissue]
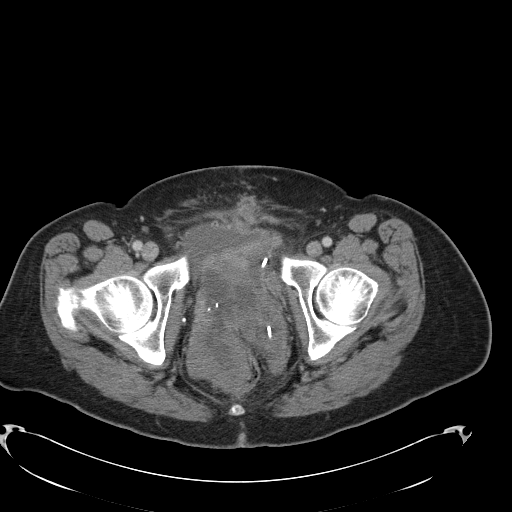
[im 19/90  soft-tissue]
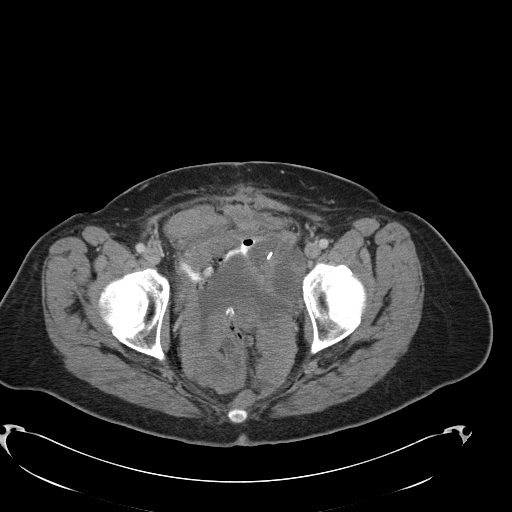
[im 29/90  soft-tissue]
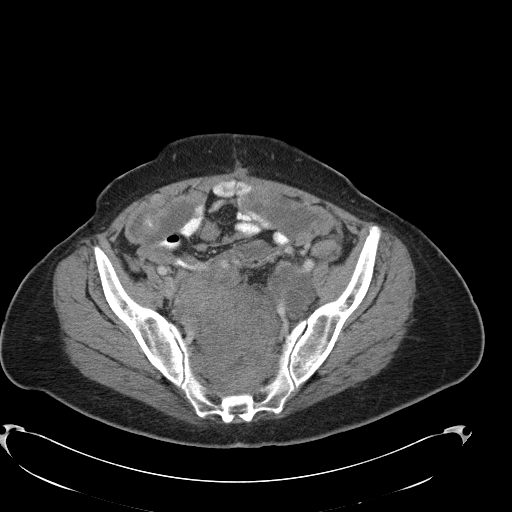
[im 33/90  soft-tissue]
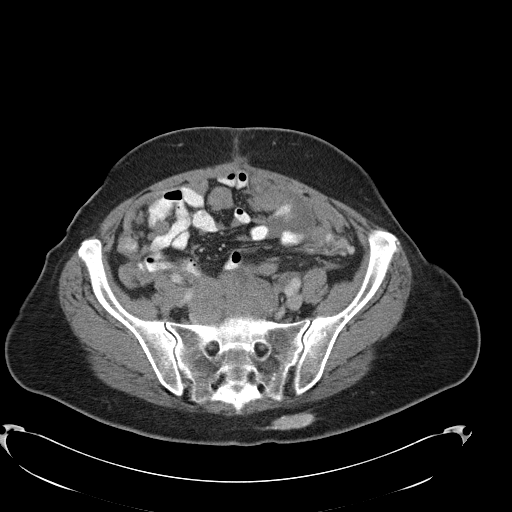
[im 43/90  soft-tissue]
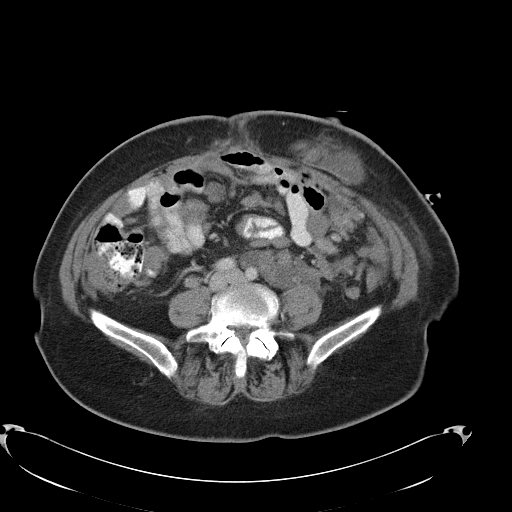
[im 47/90  soft-tissue]
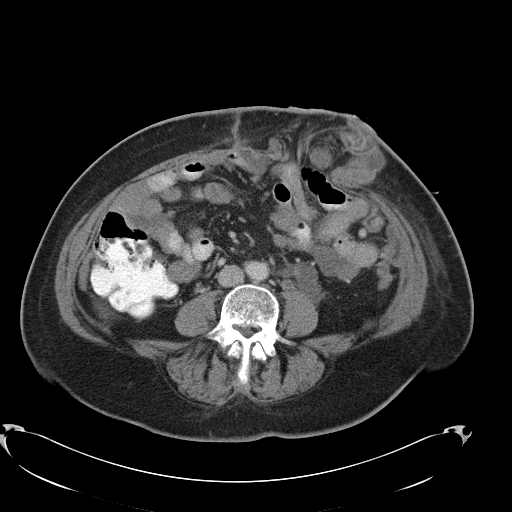
[im 57/90  soft-tissue]
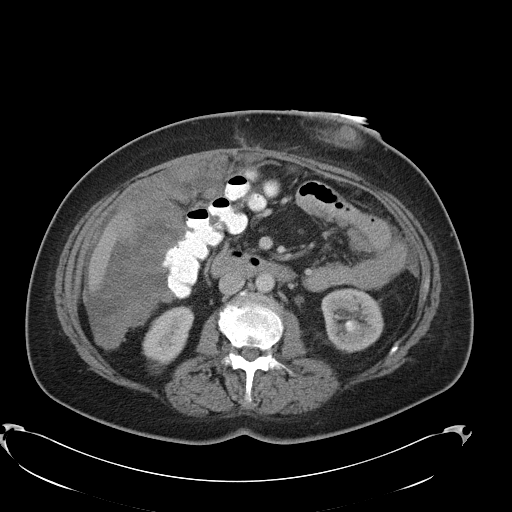
[im 61/90  soft-tissue]
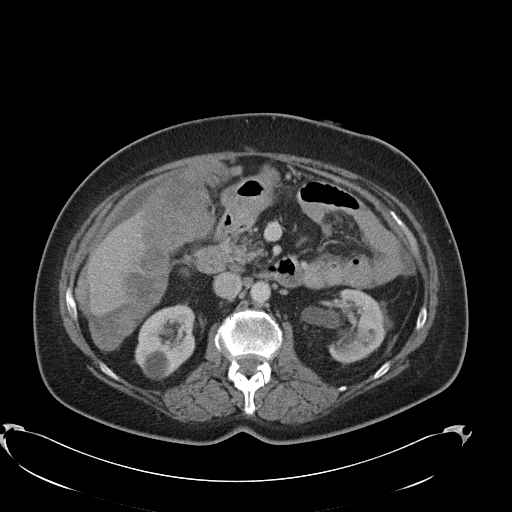
[im 61/90  bone]
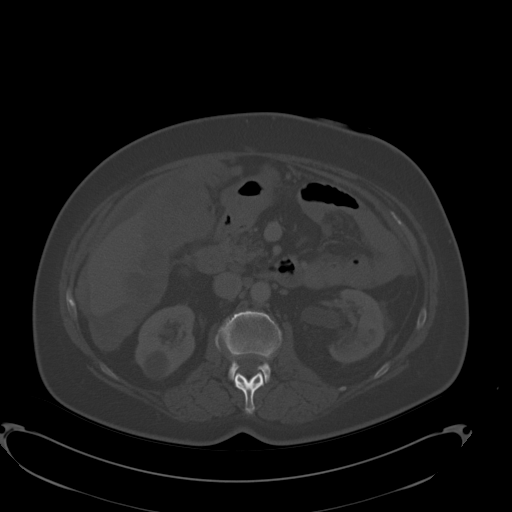
[im 71/90  soft-tissue]
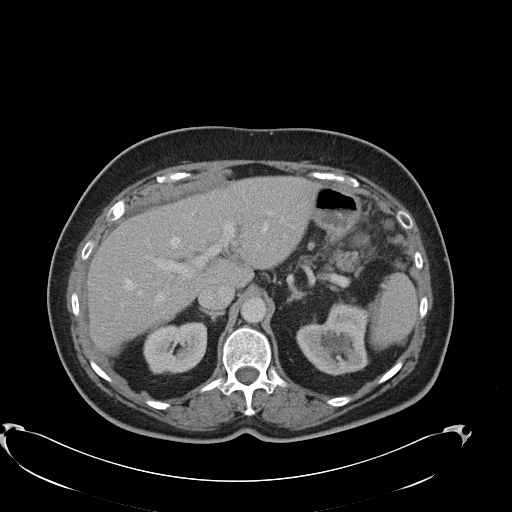
[im 75/90  soft-tissue]
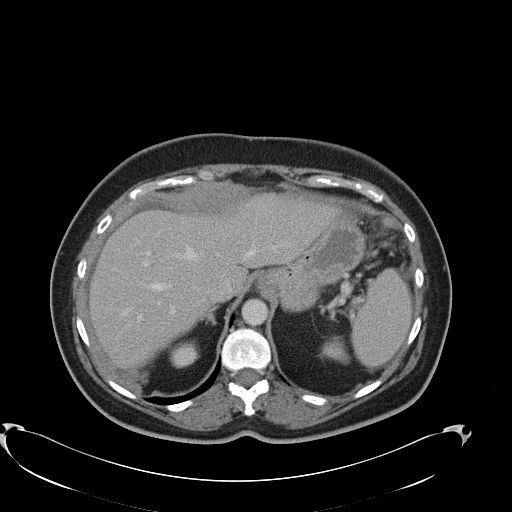
[im 85/90  soft-tissue]
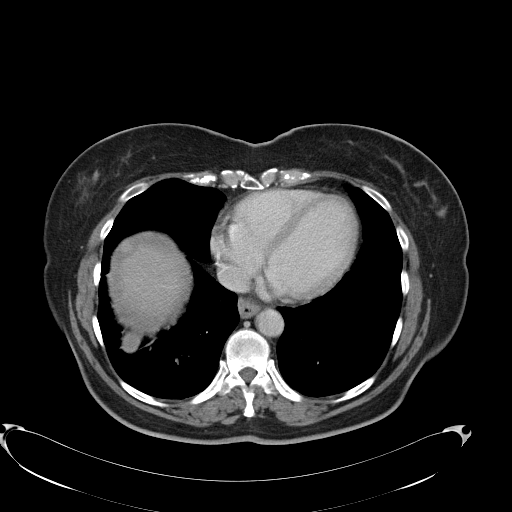

[Series 602: <mpr thick range> · coronal · 0.88mm/px · 3 of 94 slices shown]
[im 32/94  soft-tissue]
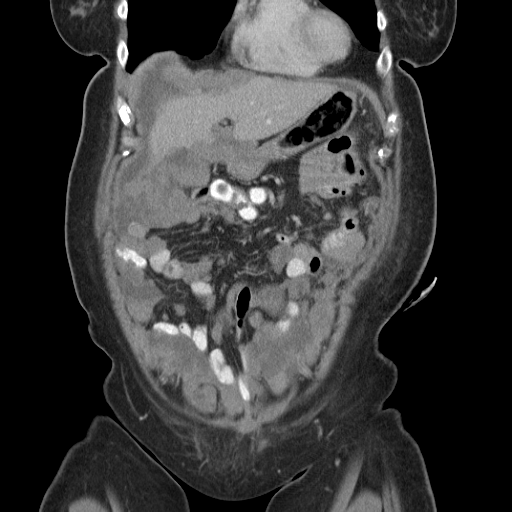
[im 42/94  soft-tissue]
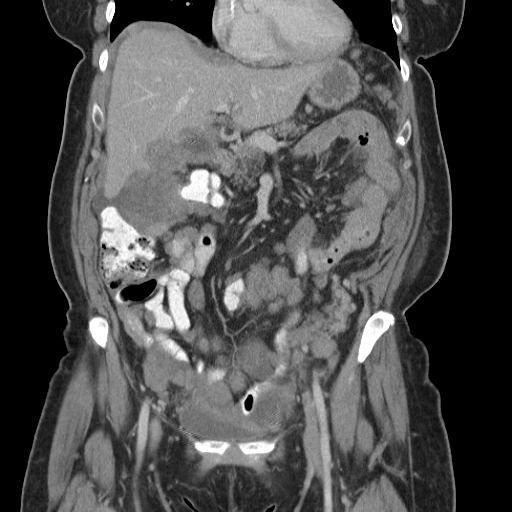
[im 52/94  soft-tissue]
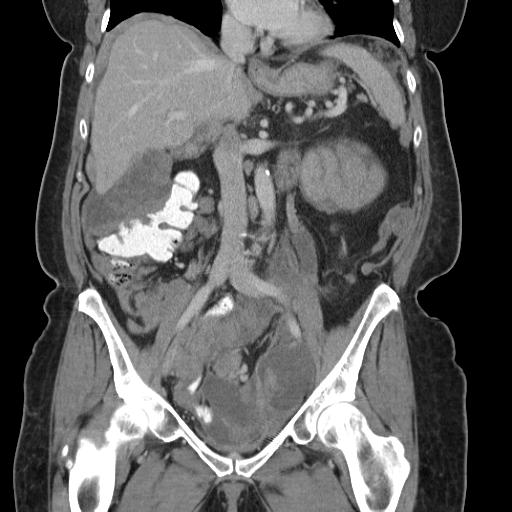

[15 of 46 positions shown; findings below may reference images not displayed]

FINDINGS: Lower chest: Small hiatal hernia. Although there is an apparent
nodular opacity in the lower right hemithorax, correlation with
sagittal and coronal reconstructions indicates that this is
subdiaphragmatic.

Hepatobiliary: No cystic or solid hepatic lesions. No intra or
extrahepatic biliary ductal dilatation. Status post cholecystectomy.
However, there are extensive serosal implants upon the liver, most
evident underneath the liver where it exerts significant mass effect
upon the adjacent hepatic parenchyma. This is irregular in shape and
therefore difficult to accurately measure, however, this is
estimated to measure approximately 19.7 x 4.8 x 9.8 cm.

Pancreas: Unremarkable.

Spleen: No intrinsic splenic lesions. However, there do appear to be
multiple small serosal implants upon the surface of the spleen.

Adrenals/Urinary Tract: Multiple well-defined low-attenuation
lesions are again noted in the kidneys bilaterally, compatible with
a combination of cysts and parapelvic cyst. Many of these lesions
are too small to definitively characterize bilateral adrenal glands
are normal in appearance. Mild left-sided hydroureteronephrosis,
likely secondary to extrinsic compression of the left ureter,
related to bulky left adnexal mass and left gonadal distribution
lymphadenopathy. Urinary bladder is completely distorted by multiple
large pelvic mass with, displaced anteriorly, but is otherwise
unremarkable in appearance.

Stomach/Bowel: The appearance of the stomach is normal. No
pathologic dilatation of small bowel or colon. However, there are
diffuse implants upon the surface of the rectum (Hartmann's pouch)
and small bowel throughout the low anatomic pelvis, compatible with
advanced peritoneal spread of disease. Patient is status post left
hemicolectomy with left-sided colostomy. There is a large parastomal
hernia, which contains some loops of small bowel, but also contains
multiple soft tissue lesions, compatible with soft tissue
metastasis, largest of which measures 3.4 x 2.6 cm.

Vascular/Lymphatic: Mild atherosclerosis is noted throughout the
abdominal and pelvic vasculature, without evidence of aneurysm or
dissection. Mildly enlarged portacaval lymph node measuring up to
2.7 x 1.8 cm. Extensive adenopathy along the left gonadal vein
distribution, in addition to abnormal malignant soft tissue in the
same distribution, with the largest lymph node in the left gonadal
vein distribution measuring up to 1.7 cm in short axis.

Reproductive: Status post total abdominal hysterectomy and bilateral
salpingo-oophorectomy. Large left adnexal mass, inseparable from
extensive malignant soft tissue throughout the pelvis.

Other: Trace volume of ascites.  No pneumoperitoneum.

Musculoskeletal: There are no aggressive appearing lytic or blastic
lesions noted in the visualized portions of the skeleton.
IMPRESSION: 1. Today's study demonstrates significant progression of widespread
metastatic disease in the abdomen and pelvis, with innumerable
peritoneal implants, bulkiest in the low anatomic pelvis, widespread
serosal implants on the surface of the spleen in the liver (bulkiest
on the undersurface of the liver), and soft tissue implants
extending into the patient's parastomal hernia associated with her
left-sided colostomy.
2. Additional incidental findings, as above.

## 2016-04-15 ENCOUNTER — Other Ambulatory Visit: Payer: Self-pay | Admitting: Nurse Practitioner

## 2016-12-14 ENCOUNTER — Other Ambulatory Visit: Payer: Self-pay | Admitting: Nurse Practitioner
